# Patient Record
Sex: Female | Born: 1940 | Race: White | Hispanic: No | Smoking: Never smoker
Health system: Southern US, Community
[De-identification: ages and names within clinical notes are randomized; demographics above are authoritative.]

## PROBLEM LIST (undated history)

## (undated) DIAGNOSIS — F419 Anxiety disorder, unspecified: Secondary | ICD-10-CM

## (undated) DIAGNOSIS — K279 Peptic ulcer, site unspecified, unspecified as acute or chronic, without hemorrhage or perforation: Secondary | ICD-10-CM

## (undated) DIAGNOSIS — M7751 Other enthesopathy of right foot: Secondary | ICD-10-CM

## (undated) DIAGNOSIS — Z860100 Personal history of colon polyps, unspecified: Secondary | ICD-10-CM

## (undated) DIAGNOSIS — D369 Benign neoplasm, unspecified site: Secondary | ICD-10-CM

## (undated) DIAGNOSIS — M1990 Unspecified osteoarthritis, unspecified site: Secondary | ICD-10-CM

## (undated) DIAGNOSIS — H269 Unspecified cataract: Secondary | ICD-10-CM

## (undated) DIAGNOSIS — T7840XA Allergy, unspecified, initial encounter: Secondary | ICD-10-CM

## (undated) DIAGNOSIS — I10 Essential (primary) hypertension: Secondary | ICD-10-CM

## (undated) DIAGNOSIS — K635 Polyp of colon: Secondary | ICD-10-CM

## (undated) DIAGNOSIS — K219 Gastro-esophageal reflux disease without esophagitis: Secondary | ICD-10-CM

## (undated) DIAGNOSIS — E785 Hyperlipidemia, unspecified: Secondary | ICD-10-CM

## (undated) DIAGNOSIS — F32A Depression, unspecified: Secondary | ICD-10-CM

## (undated) HISTORY — DX: Polyp of colon: K63.5

## (undated) HISTORY — DX: Depression, unspecified: F32.A

## (undated) HISTORY — DX: Allergy, unspecified, initial encounter: T78.40XA

## (undated) HISTORY — PX: CERVICAL DISCECTOMY: SHX98

## (undated) HISTORY — DX: Personal history of colonic polyps: Z86.010

## (undated) HISTORY — DX: Personal history of colon polyps, unspecified: Z86.0100

## (undated) HISTORY — PX: ABDOMINAL HYSTERECTOMY: SHX81

## (undated) HISTORY — PX: SPINE SURGERY: SHX786

## (undated) HISTORY — DX: Gastro-esophageal reflux disease without esophagitis: K21.9

## (undated) HISTORY — PX: TUBAL LIGATION: SHX77

## (undated) HISTORY — DX: Essential (primary) hypertension: I10

## (undated) HISTORY — PX: EYE SURGERY: SHX253

## (undated) HISTORY — PX: COLON SURGERY: SHX602

## (undated) HISTORY — DX: Anxiety disorder, unspecified: F41.9

## (undated) HISTORY — PX: CHOLECYSTECTOMY: SHX55

## (undated) HISTORY — DX: Unspecified cataract: H26.9

## (undated) HISTORY — DX: Benign neoplasm, unspecified site: D36.9

## (undated) HISTORY — DX: Other enthesopathy of right foot and ankle: M77.51

## (undated) HISTORY — DX: Hyperlipidemia, unspecified: E78.5

## (undated) HISTORY — PX: OTHER SURGICAL HISTORY: SHX169

## (undated) HISTORY — DX: Peptic ulcer, site unspecified, unspecified as acute or chronic, without hemorrhage or perforation: K27.9

---

## 2000-12-13 ENCOUNTER — Encounter: Payer: Self-pay | Admitting: Neurosurgery

## 2000-12-17 ENCOUNTER — Encounter: Payer: Self-pay | Admitting: Neurosurgery

## 2000-12-17 ENCOUNTER — Ambulatory Visit (HOSPITAL_COMMUNITY): Admission: RE | Admit: 2000-12-17 | Discharge: 2000-12-17 | Payer: Self-pay | Admitting: Neurosurgery

## 2004-12-02 ENCOUNTER — Ambulatory Visit: Payer: Self-pay | Admitting: Gastroenterology

## 2004-12-23 ENCOUNTER — Ambulatory Visit: Payer: Self-pay | Admitting: Cardiology

## 2005-01-02 ENCOUNTER — Ambulatory Visit: Payer: Self-pay

## 2005-01-07 ENCOUNTER — Ambulatory Visit: Payer: Self-pay | Admitting: Cardiology

## 2006-06-13 ENCOUNTER — Ambulatory Visit: Payer: Self-pay | Admitting: Family Medicine

## 2008-01-10 ENCOUNTER — Ambulatory Visit: Payer: Self-pay | Admitting: Gastroenterology

## 2008-06-25 ENCOUNTER — Other Ambulatory Visit: Payer: Self-pay

## 2008-06-25 ENCOUNTER — Emergency Department: Payer: Self-pay | Admitting: Emergency Medicine

## 2008-12-23 ENCOUNTER — Emergency Department: Payer: Self-pay | Admitting: Emergency Medicine

## 2009-01-21 ENCOUNTER — Emergency Department: Payer: Self-pay | Admitting: Emergency Medicine

## 2009-01-23 ENCOUNTER — Ambulatory Visit: Payer: Self-pay | Admitting: Cardiology

## 2009-01-23 ENCOUNTER — Ambulatory Visit: Payer: Self-pay | Admitting: Emergency Medicine

## 2009-01-30 ENCOUNTER — Ambulatory Visit: Payer: Self-pay

## 2009-02-08 ENCOUNTER — Ambulatory Visit: Payer: Self-pay | Admitting: Cardiology

## 2009-05-06 ENCOUNTER — Encounter: Payer: Self-pay | Admitting: Cardiology

## 2009-05-07 ENCOUNTER — Ambulatory Visit: Payer: Self-pay | Admitting: Family Medicine

## 2009-05-20 ENCOUNTER — Ambulatory Visit: Payer: Self-pay | Admitting: Family Medicine

## 2009-09-09 ENCOUNTER — Ambulatory Visit: Payer: Self-pay | Admitting: Family Medicine

## 2009-11-23 HISTORY — PX: BREAST BIOPSY: SHX20

## 2010-04-08 ENCOUNTER — Emergency Department: Payer: Self-pay | Admitting: Unknown Physician Specialty

## 2010-04-11 ENCOUNTER — Emergency Department: Payer: Self-pay | Admitting: Emergency Medicine

## 2010-04-15 ENCOUNTER — Emergency Department: Payer: Self-pay | Admitting: Emergency Medicine

## 2010-04-22 ENCOUNTER — Emergency Department: Payer: Self-pay | Admitting: Emergency Medicine

## 2010-11-06 ENCOUNTER — Ambulatory Visit: Payer: Self-pay | Admitting: Family Medicine

## 2011-04-07 NOTE — Assessment & Plan Note (Signed)
[facility] OFFICE NOTE   NAME:Rita Cruz, Rita Cruz                MRN:          [id number]  DATE:01/30/2009                            DOB:          29-Sep-1941    PRIMARY CARE PHYSICIAN:  Dr. Julieanne Manson at [facility].   HISTORY OF PRESENT ILLNESS:  This is a 70 year old with history of  hypertension who initially presented to Cardiology Clinic for evaluation  of chest discomfort.  The patient was having atypical-type chest pain  associated with emotional stress.  She was seen for this in the  emergency department at [facility] and referred  over to [place] for further evaluation.  Since we last saw her, some of the  stressors in her life have resolved.  She has had no further chest pain  at all.  She did have an exercise treadmill Myoview done last week.  She  exercised for 6 minutes, stopped due to fatigue.  EF was 79% on gated  images.  Normal wall motion.  There were no perfusion defects;  therefore, no evidence for ischemia or infarction.  The patient has been  doing well.  In general, she does not get short of breath with exertion.  She does water aerobics.  The only other issues is that she had a  question of a density on the lateral chest x-ray film done when she came  to the emergency department with chest pain.  She had a CT scan that per  her report seems to show a lung nodule.  She is going to have a followup  CT in about 3 months for that.   PAST MEDICAL HISTORY:  1. Adenosine Myoview March 2010.  The patient exercised for 6 minutes      and stopped due to fatigue.  She reached 84% of maximum predicted      heart rate.  EF of 79%.  Perfusion images were normal with no      evidence for ischemia or infarction.  2. Peptic ulcer disease with history of H. Pylori.  3. Gastroesophageal reflux disease.  4. Hypertension.  5. History of colonic polyps.  6.  Cholecystectomy.  7. Hysterectomy.  8. Cervical diskectomy.  9. Anxiety.   ALLERGIES:  Penicillin.   MEDICATIONS:  1. Lisinopril/hydrochlorothiazide 40/25 mg daily.  2. Labetalol 10 mg daily.  3. Estradiol.  4. Zoloft.  5. Aspirin 81 mg daily.  6. Norvasc 5 mg daily.   Most recent labs from February 2010, show triglycerides 196, LDL 119.   PHYSICAL EXAMINATION:  VITAL SIGNS:  Blood pressure today is 124/70,  heart rate 64 and regular.  GENERAL:  This is a well-developed female in no apparent distress.  NEUROLOGIC:  Alert and oriented x3.  Normal affect.  LUNGS:  Clear to auscultation bilaterally.  Normal respiratory effort.  CARDIOVASCULAR:  Heart regular S1, S2.  No S3, no S4.  No murmur.  No  peripheral edema.  2+ posterior tibial pulses bilaterally.  No carotid  bruit.  NECK:  There is no JVD.  There is no thyromegaly or thyroid nodule.  ABDOMEN:  Soft, nontender.  No hepatosplenomegaly, normal bowel sounds.   ASSESSMENT AND PLAN:  A 70 year old with history of hypertension and  atypical chest pain who came in to Cardiology Clinic for evaluation of  her chest pain.  1. Chest pain.  The patient's pain was quite atypical and seemed to be      associated mainly with anxiety.  It may have been related to a      panic attack as she had a more serious episode when she heard that      her son was sick in the hospital.  We did do an exercise treadmill      Myoview.  This showed no evidence for ischemia or infarction.  She      will continue on her aspirin.  2. Hypertension.  The patient's blood pressure is elevated when she      initially came to clinic.  Blood pressure today is excellent at      124/70.  This is after starting Norvasc.  She will continue on her      Norvasc.  3. Cholesterol.  The patient's LDL is 119.  Given the normal Myoview,      this is probably at goal for her.  4. The patient can follow up as needed in this office in the future.     Marca Ancona,  MD  Electronically Signed    DM/MedQ  DD: 02/08/2009  DT: 02/09/2009  Job #: [id number]   cc:   Julieanne Manson

## 2011-04-07 NOTE — Assessment & Plan Note (Signed)
[facility] OFFICE NOTE   NAME:Rita Cruz, Rita Cruz                MRN:          [id number]  DATE:01/23/2009                            DOB:          1941-08-15    PRIMARY CARE PHYSICIAN:  Dr. Julieanne Manson at [facility].   HISTORY OF PRESENT ILLNESS:  This is a 70 year old with a history of  hypertension and prior negative adenosine Myoview who presents to  Cardiology Clinic for evaluation of chest discomfort.  The patient was  seen in our clinic back in 2006.  She was having atypical chest pain at  that time and had an adenosine Myoview that was normal.  She has not had  Cardiology followup since that time.  She does come here today for  evaluation of chest pain.  She has been under a lot of stress lately.  She was seen in the emergency department at [facility] on January 31 with anxiety and paresthesias in her hands.  Her  systolic blood pressure at that time was greater than 200.  Hydrochlorothiazide was added to her regimen at that time, and she also  was started on Zoloft for anxiety.  She started taking the Zoloft and  felt very weak and jittery after starting Zoloft.  Then on March 1, she  learned that her son was in the emergency department in [place] with  diverticulitis.  She became extremely anxious and then developed burning  in her lower chest.  This was in the middle of the chest.  It came on in  waves.  It was associated with paresthesias in her hands.  She was  extremely anxious.  She actually states that the pain did get somewhat  better with ambulation.  She did come into the emergency department at  [facility].  She had an EKG done while she was  still having pain that was completely normal.  She had one set of  cardiac enzymes that were negative.  While she was in the emergency  department, eventually the pain went away  completely and she was  discharged home.  Yesterday, she had 1 brief spell while she was lying  down in bed of the central chest burning.  This lasted about only for a  few minutes and then completely resolved.  Also, while in the emergency  department at [facility], the patient had a chest x-ray done with a  question of density on the lateral film.  She is having a CT chest today  to follow that up.   PAST MEDICAL HISTORY:  1. Adenosine Myoview in 2006 done for atypical chest pain, showed no      evidence for ischemia or infarct.  2. Peptic ulcer disease with history of H. Pylori.  3. Gastroesophageal reflux disease.  4. Hypertension.  5. History of colonic polyps  6. Cholecystectomy.  7. Hysterectomy.  8. Cervical diskectomy.  9. Anxiety.   ALLERGIES:  PENICILLIN.   MEDICATIONS:  1. Lisinopril 40 mg daily.  2. Nebivolol 10 mg daily.  3. Hydrochlorothiazide 25 mg daily.  4.  Estradiol.  5. Zoloft.  6. Aspirin 81 mg daily.   SOCIAL HISTORY:  The patient does not smoke.  She has 2 children and her  husband has 2 children.  This is her second marriage.  She rarely drinks  alcohol.   FAMILY HISTORY:  The patient's mother died at the age of 69.  The  patient's father had congestive heart failure and diabetes.   REVIEW OF SYSTEMS:  Negative except as noted in the history of present  illness.   Most recent labs from the emergency department in [place] on March 1  showed cardiac enzymes negative x1 set, creatinine 1.1, hematocrit 41,  and platelet 340.  Cholesterol from February 2010, triglycerides 196 and  LDL 119.   EKG was reviewed today shows normal sinus rhythm and is normal EKG.   PHYSICAL EXAMINATION:  VITAL SIGNS:  Blood pressure is 158/78 and heart  rate is 68 and regular.  GENERAL:  This is a well-developed female in no apparent distress.  NEUROLOGIC:  Alert and orient x3.  Normal affect.  LUNGS:  Clear to auscultation bilaterally.  Normal respiratory effort.   CARDIOVASCULAR:  Heart regular S1 and S2.  No S3.  No S4.  There is no  murmur.  There is no peripheral edema.  There are 2+ posterior tibial  pulses bilaterally.  There is no carotid bruit.  There is no edema.  NECK:  There is no JVD.  There is no thyromegaly or thyroid nodule.  HEENT:  Normal exam.  SKIN:  Normal exam.  ABDOMEN:  Soft, nontender.  No hepatosplenomegaly.  Normal bowel sounds.  EXTREMITIES:  No clubbing or cyanosis.  MUSCULOSKELETAL:  Normal exam.   ASSESSMENT AND PLAN:  This is a 70 year old with a history of  hypertension and prior atypical chest pain, who presents to Cardiology  Clinic for evaluation of atypical chest pain.  1. Chest pain.  The patient's chest pain was quite atypical.  It does      seem to be associated with anxiety.  I do think that this probably      was a panic attack upon the patient hearing that her son was sick      in the hospital.  She does have a history of anxiety.  The patient      does have risk factors for coronary artery disease including her      age and her hypertension.  She does use estrogen replacement as      well.  I did tell the patient that I thought it would be reasonable      to go ahead and do an exercise treadmill Myoview to assess for any      evidence of ischemia or infarction.  She will continue taking her      aspirin.  2. Hypertension.  The patient's blood pressure is well above goal at      158/78 today.  She does bring the results from her health fair      screen done last week, and it was 130/100 there.  She is on      nebivolol, lisinopril, and hydrochlorothiazide now.  We will add      Norvasc 5 mg daily today.  She will have a return visit in 2 weeks,      where we will go over the results of her Myoview.  We will also      check a blood pressure at that time and titrate her blood pressure  medications as needed.  3. Cholesterol.  The patient's LDL is 119.  As long as she has a      normal Myoview, this is  probably at goal.     Marca Ancona, MD  Electronically Signed    DM/MedQ  DD: 01/23/2009  DT: 01/24/2009  Job #: [id number]   cc:   Julieanne Manson

## 2011-04-10 NOTE — Op Note (Signed)
[place]  Patient:    Rita Cruz, Rita Cruz                     MRN: [id number] Proc. Date: 12/17/00 Adm. 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  PROCEDURE:  C4-5 and C5-6 anterior cervical diskectomy and fusion with allograft and anterior plate instrumentation.  SURGEON:  Julio Sicks, M.D.  ASSISTANT:  Reinaldo Meeker, M.D.  ANESTHESIA:  General endotracheal.  INDICATION:  Rita Cruz is a 70 year old female with history of neck and left shoulder and upper extremity pain consistent with a left C5 and C6 radiculopathy.  The patient also has early evidence of a cervical myelopathy. MRI scanning demonstrates a central disk herniation with associated spondylosis causing severe compression of the spinal cord at C4-5.  At C5-6, the patient has advanced spondylosis causing severe compression of the exiting left-sided C6 nerve root.  The patient has been counseled as to her options. She has decided to proceed with a C4-5 and C5-6 anterior cervical diskectomy and fusion with allograft and anterior plating for hopeful relief of her symptoms.  DESCRIPTION OF PROCEDURE:  Patient taken to the operating room and placed on the operating table in a supine position.  After adequate level of anesthesia was achieved, the patient was positioned supine with her neck slightly extended and placed in Holter traction.  The anterior cervical region is shaved and prepped sterilely.  A 10 blade was used to make a linear skin incision overlying the C5 level.  This was  carried down sharply to the platysma.  The platysma was then divided vertically, and dissection proceeds along the medial border of the sternomastoid and the carotid sheath on the right side.  Trachea and esophagus are mobilized and carried toward the left. Prevertebral fascia is stripped off the anterior spinal column.  The longus colli muscle is then elevated bilaterally using electrocautery.  A deep self-retaining retractor is placed.  Intraoperative fluoroscopy was used, and the C4-5 and the C5-6 levels were confirmed.  Disk spaces were then incised at both levels using a 15 blade in rectangular fashion.  A wide disk space cleanout was then achieved using pituitary rongeurs, forward- and backward-angled Carlin curettes, Kerrison rongeurs, and high-speed drill.  All elements of the disk were removed down over the posterior annulus at both levels.  The microscope brought into the field and used throughout the remainder of the diskectomy.  Starting first at C4-5, the remaining aspects of osteophytes and annulus were removed using the high-speed drill down to the level of the posterior longitudinal ligament.  Posterior longitudinal ligament then elevated and resected in piecemeal fashion using Kerrison rongeurs.  The underlying thecal sac was identified.  The central disk herniation was completely resected.  Decompression then proceeded out into the left-sided C5 foramen.  The left-sided C5 nerve root was identified and well-decompressed within its foramen.  Decompression then proceeded out to the right-sided C5 foramen.  Once again, the C5 nerve root was identified and found to be well-decompressed.  Attention then placed to C5-6.  Once again, the remaining aspects of the annulus and osteophytes were removed using the high-speed drill down to the level of the posterior longitudinal ligament.  Posterior longitudinal ligament was then elevated and resected in piecemeal fashion using Kerrison  rongeurs.  Thecal sac was identified.  Decompression then proceeded out to the left-sided C6 foramen.  A large amount of osteophytic narrowing was resected using Kerrison rongeurs.  The C6 nerve root was identified and followed out distally to its foramen.  A generous anterior foraminotomy was performed at this level.  Decompression then proceeded out to the right-sided C6 foramen.  Once again the C6 nerve root was identified and widely decompressed.  The wound was then copiously irrigated with antibiotic solution.  Gelfoam was placed operatively for hemostasis, which was found to be good.  Microscope was removed.  A 42.5 mm Atlantis anterior cervical plate was then placed over the C4, C5, and C6 levels.  It was then attached to the C4, C5, and C6 levels by first drilling pilot holes under fluoroscopic guidance, tapping the pilot holes, and then placing 13 mm fixed-angle 4.0 mm cancellous bone screws.  On the left screw at C4, the screw stripped slightly as it was going in.  A rescue 4.5 mm screw was used, which was solidly placed. All screws were given a final tightening.  Locking screws were engaged. Retractor system was removed, hemostasis achieved with the bipolar electrocautery.  The wound was then irrigated a final time with antibiotic solution.  The wound was closed in a typical fashion.  There were no apparent complications.  The patient tolerated the procedure well and was taken to the recovery room postoperatively. DD:  12/17/00 TD:  12/17/00 Job: [id number]

## 2011-05-11 ENCOUNTER — Encounter: Payer: Self-pay | Admitting: Cardiovascular Disease

## 2011-06-11 ENCOUNTER — Ambulatory Visit: Payer: Self-pay | Admitting: Gastroenterology

## 2011-06-13 LAB — PATHOLOGY REPORT

## 2011-06-29 ENCOUNTER — Ambulatory Visit: Payer: Self-pay | Admitting: General Surgery

## 2011-07-01 LAB — PATHOLOGY REPORT

## 2011-11-11 ENCOUNTER — Emergency Department: Payer: Self-pay | Admitting: Internal Medicine

## 2011-12-30 ENCOUNTER — Ambulatory Visit: Payer: Self-pay | Admitting: General Surgery

## 2012-11-30 ENCOUNTER — Ambulatory Visit: Payer: Self-pay | Admitting: Family Medicine

## 2013-03-24 DIAGNOSIS — R159 Full incontinence of feces: Secondary | ICD-10-CM | POA: Insufficient documentation

## 2013-03-24 DIAGNOSIS — N3946 Mixed incontinence: Secondary | ICD-10-CM | POA: Insufficient documentation

## 2013-06-02 ENCOUNTER — Encounter: Payer: Self-pay | Admitting: Urology

## 2013-06-23 ENCOUNTER — Encounter: Payer: Self-pay | Admitting: Urology

## 2013-07-24 ENCOUNTER — Encounter: Payer: Self-pay | Admitting: Urology

## 2013-08-23 ENCOUNTER — Encounter: Payer: Self-pay | Admitting: Urology

## 2013-12-15 ENCOUNTER — Ambulatory Visit: Payer: Self-pay | Admitting: Family Medicine

## 2014-05-17 ENCOUNTER — Ambulatory Visit: Payer: Self-pay | Admitting: Family Medicine

## 2014-06-14 LAB — CBC AND DIFFERENTIAL
HCT: 36 % (ref 36–46)
Hemoglobin: 12.3 g/dL (ref 12.0–16.0)
Neutrophils Absolute: 5 /uL
Platelets: 249 10*3/uL (ref 150–399)
WBC: 7.2 10^3/mL

## 2014-06-14 LAB — HEPATIC FUNCTION PANEL
ALT: 10 U/L (ref 7–35)
AST: 11 U/L — AB (ref 13–35)
Alkaline Phosphatase: 75 U/L (ref 25–125)

## 2014-06-14 LAB — BASIC METABOLIC PANEL
BUN: 13 mg/dL (ref 4–21)
Creatinine: 1.2 mg/dL — AB (ref 0.5–1.1)
Glucose: 103 mg/dL
Potassium: 4.7 mmol/L (ref 3.4–5.3)
Sodium: 141 mmol/L (ref 137–147)

## 2014-06-14 LAB — TSH: TSH: 1.73 u[IU]/mL (ref 0.41–5.90)

## 2014-11-19 ENCOUNTER — Ambulatory Visit: Payer: Self-pay | Admitting: Ophthalmology

## 2014-11-19 DIAGNOSIS — I10 Essential (primary) hypertension: Secondary | ICD-10-CM

## 2014-11-27 ENCOUNTER — Ambulatory Visit: Payer: Self-pay | Admitting: Ophthalmology

## 2014-11-27 DIAGNOSIS — H2512 Age-related nuclear cataract, left eye: Secondary | ICD-10-CM | POA: Diagnosis not present

## 2014-11-27 DIAGNOSIS — C4490 Unspecified malignant neoplasm of skin, unspecified: Secondary | ICD-10-CM | POA: Diagnosis not present

## 2014-11-27 DIAGNOSIS — K219 Gastro-esophageal reflux disease without esophagitis: Secondary | ICD-10-CM | POA: Diagnosis not present

## 2014-11-27 DIAGNOSIS — Z880 Allergy status to penicillin: Secondary | ICD-10-CM | POA: Diagnosis not present

## 2014-11-27 DIAGNOSIS — H269 Unspecified cataract: Secondary | ICD-10-CM | POA: Diagnosis not present

## 2014-11-27 DIAGNOSIS — I10 Essential (primary) hypertension: Secondary | ICD-10-CM | POA: Diagnosis not present

## 2014-11-27 DIAGNOSIS — Z885 Allergy status to narcotic agent status: Secondary | ICD-10-CM | POA: Diagnosis not present

## 2014-11-27 DIAGNOSIS — F419 Anxiety disorder, unspecified: Secondary | ICD-10-CM | POA: Diagnosis not present

## 2014-12-17 ENCOUNTER — Ambulatory Visit: Payer: Self-pay | Admitting: Family Medicine

## 2014-12-17 DIAGNOSIS — Z1231 Encounter for screening mammogram for malignant neoplasm of breast: Secondary | ICD-10-CM | POA: Diagnosis not present

## 2014-12-18 ENCOUNTER — Ambulatory Visit: Payer: Self-pay | Admitting: Ophthalmology

## 2014-12-18 DIAGNOSIS — Z885 Allergy status to narcotic agent status: Secondary | ICD-10-CM | POA: Diagnosis not present

## 2014-12-18 DIAGNOSIS — H269 Unspecified cataract: Secondary | ICD-10-CM | POA: Diagnosis not present

## 2014-12-18 DIAGNOSIS — I10 Essential (primary) hypertension: Secondary | ICD-10-CM | POA: Diagnosis not present

## 2014-12-18 DIAGNOSIS — Z880 Allergy status to penicillin: Secondary | ICD-10-CM | POA: Diagnosis not present

## 2014-12-18 DIAGNOSIS — H2511 Age-related nuclear cataract, right eye: Secondary | ICD-10-CM | POA: Diagnosis not present

## 2015-03-18 DIAGNOSIS — I10 Essential (primary) hypertension: Secondary | ICD-10-CM | POA: Diagnosis not present

## 2015-03-18 DIAGNOSIS — F411 Generalized anxiety disorder: Secondary | ICD-10-CM | POA: Diagnosis not present

## 2015-03-18 DIAGNOSIS — E119 Type 2 diabetes mellitus without complications: Secondary | ICD-10-CM | POA: Diagnosis not present

## 2015-03-18 DIAGNOSIS — K219 Gastro-esophageal reflux disease without esophagitis: Secondary | ICD-10-CM | POA: Diagnosis not present

## 2015-03-18 DIAGNOSIS — F329 Major depressive disorder, single episode, unspecified: Secondary | ICD-10-CM | POA: Diagnosis not present

## 2015-03-18 LAB — HEMOGLOBIN A1C: Hgb A1c MFr Bld: 5.2 % (ref 4.0–6.0)

## 2015-03-24 NOTE — Op Note (Signed)
PATIENT NAME:  Rita Cruz, Rita Cruz MR#:  [id number] DATE OF BIRTH:  [birth year]  DATE OF PROCEDURE:  11/27/2014  PREOPERATIVE DIAGNOSIS:  Nuclear sclerotic cataract of the left eye.   POSTOPERATIVE DIAGNOSIS:  Nuclear sclerotic cataract of the left eye.   OPERATIVE PROCEDURE:  Cataract extraction by phacoemulsification with implant of intraocular lens to left eye.   SURGEON:  Birder Robson, MD.   ANESTHESIA:  1. Managed anesthesia care.  2. Topical tetracaine drops followed by 2% Xylocaine jelly applied in the preoperative holding area.   COMPLICATIONS:  None.   TECHNIQUE:   Stop and chop.   DESCRIPTION OF PROCEDURE:  The patient was examined and consented in the preoperative holding area where the aforementioned topical anesthesia was applied to the left eye and then brought back to the Operating Room where the left eye was prepped and draped in the usual sterile ophthalmic fashion and a lid speculum was placed. A paracentesis was created with the side port blade and the anterior chamber was filled with viscoelastic. A near clear corneal incision was performed with the steel keratome. A continuous curvilinear capsulorrhexis was performed with a cystotome followed by the capsulorrhexis forceps. Hydrodissection and hydrodelineation were carried out with BSS on a blunt cannula. The lens was removed in a stop and chop technique and the remaining cortical material was removed with the irrigation-aspiration handpiece. The capsular bag was inflated with viscoelastic and the Alcon ZCB00, 20.5-diopter lens, serial number [id number] was placed in the capsular bag without complication. The remaining viscoelastic was removed from the eye with the irrigation-aspiration handpiece. The wounds were hydrated. The anterior chamber was flushed with Miostat and the eye was inflated to physiologic pressure. 0.1 mL of cefuroxime concentration 10 mg/mL was placed in the anterior chamber. The wounds were found to be  water tight. The eye was dressed with Vigamox. The patient was given protective glasses to wear throughout the day and a shield with which to sleep tonight. The patient was also given drops with which to begin a drop regimen today and will follow-up with me in one day.     ____________________________ Livingston Diones. Jettson Crable, MD wlp:at D: 11/28/2014 07:50:00 ET T: 11/28/2014 13:37:51 ET JOB#: [id number]  cc: Kerby Borner L. Alyiah Ulloa, MD, <Dictator> Livingston Diones Jaliya Siegmann MD ELECTRONICALLY SIGNED 11/28/2014 15:23

## 2015-03-24 NOTE — Op Note (Signed)
PATIENT NAME:  Rita Cruz, Rita Cruz MR#:  [id number] DATE OF BIRTH:  1941-05-09  DATE OF PROCEDURE:  12/18/2014  PREOPERATIVE DIAGNOSIS:  Nuclear sclerotic cataract of the right eye.   POSTOPERATIVE DIAGNOSIS:  Nuclear sclerotic cataract of the right eye.   OPERATIVE PROCEDURE:  Cataract extraction by phacoemulsification with implant of intraocular lens to right eye.   SURGEON:  Birder Robson, MD.   ANESTHESIA:  1. Managed anesthesia care.  2. Topical tetracaine drops followed by 2% Xylocaine jelly applied in the preoperative holding area.   COMPLICATIONS:  None.   TECHNIQUE:   Stop and chop.  DESCRIPTION OF PROCEDURE:  The patient was examined and consented in the preoperative holding area where the aforementioned topical anesthesia was applied to the right eye and then brought back to the Operating Room where the right eye was prepped and draped in the usual sterile ophthalmic fashion and a lid speculum was placed. A paracentesis was created with the side port blade and the anterior chamber was filled with viscoelastic. A near clear corneal incision was performed with the steel keratome. A continuous curvilinear capsulorrhexis was performed with a cystotome followed by the capsulorrhexis forceps. Hydrodissection and hydrodelineation were carried out with BSS on a blunt cannula. The lens was removed in a stop and chop technique and the remaining cortical material was removed with the irrigation-aspiration handpiece. The capsular bag was inflated with viscoelastic and the Tecnis ZCB00, 21.0-diopter lens, serial number [id number] was placed in the capsular bag without complication. The remaining viscoelastic was removed from the eye with the irrigation-aspiration handpiece. The wounds were hydrated. The anterior chamber was flushed with Miostat and the eye was inflated to physiologic pressure. Cefuroxime was not placed in the eye due to PENICILLIN ALLERGY, rather a 3:1 dilution of Vigamox was  placed in the anterior chamber. The wounds were found to be water tight. The eye was dressed with Vigamox. The patient was given protective glasses to wear throughout the day and a shield with which to sleep tonight. The patient was also given drops with which to begin a drop regimen today and will follow-up with me in one day.   Patient name marry which 32790.6, cataract extraction by phacoemulsification with implant of intraocular lens to the right eye.    ____________________________ Livingston Diones. Shaylea Ucci, MD wlp:at D: 12/18/2014 13:58:22 ET T: 12/18/2014 17:30:51 ET JOB#: [id number]  cc: Maxx Calaway L. Tyrece Vanterpool, MD, <Dictator>      Livingston Diones Breeonna Mone MD ELECTRONICALLY SIGNED 12/26/2014 13:56

## 2015-04-16 DIAGNOSIS — I10 Essential (primary) hypertension: Secondary | ICD-10-CM | POA: Diagnosis not present

## 2015-04-16 DIAGNOSIS — F411 Generalized anxiety disorder: Secondary | ICD-10-CM | POA: Diagnosis not present

## 2015-04-16 DIAGNOSIS — E119 Type 2 diabetes mellitus without complications: Secondary | ICD-10-CM | POA: Diagnosis not present

## 2015-04-16 DIAGNOSIS — N951 Menopausal and female climacteric states: Secondary | ICD-10-CM | POA: Diagnosis not present

## 2015-05-02 ENCOUNTER — Other Ambulatory Visit: Payer: Self-pay | Admitting: Family Medicine

## 2015-05-02 MED ORDER — VENLAFAXINE HCL ER 75 MG PO CP24: 75.0000 mg | ORAL_CAPSULE | Freq: Every day | ORAL | Status: DC

## 2015-05-02 NOTE — Telephone Encounter (Signed)
Pt contacted office for refill request on the following medications:Venlafaxine 75mg  1 tablet daily 90 day supply.  [place].  PQ#[phone]/FH

## 2015-05-11 DIAGNOSIS — K219 Gastro-esophageal reflux disease without esophagitis: Secondary | ICD-10-CM | POA: Diagnosis not present

## 2015-05-11 DIAGNOSIS — F419 Anxiety disorder, unspecified: Secondary | ICD-10-CM | POA: Diagnosis not present

## 2015-05-11 DIAGNOSIS — Z880 Allergy status to penicillin: Secondary | ICD-10-CM | POA: Diagnosis not present

## 2015-05-11 DIAGNOSIS — I10 Essential (primary) hypertension: Secondary | ICD-10-CM | POA: Diagnosis not present

## 2015-05-11 DIAGNOSIS — K5792 Diverticulitis of intestine, part unspecified, without perforation or abscess without bleeding: Secondary | ICD-10-CM | POA: Diagnosis not present

## 2015-05-23 DIAGNOSIS — R739 Hyperglycemia, unspecified: Secondary | ICD-10-CM | POA: Insufficient documentation

## 2015-05-23 DIAGNOSIS — D126 Benign neoplasm of colon, unspecified: Secondary | ICD-10-CM | POA: Insufficient documentation

## 2015-05-23 DIAGNOSIS — IMO0002 Reserved for concepts with insufficient information to code with codable children: Secondary | ICD-10-CM

## 2015-05-23 DIAGNOSIS — E7800 Pure hypercholesterolemia, unspecified: Secondary | ICD-10-CM | POA: Insufficient documentation

## 2015-05-23 DIAGNOSIS — F411 Generalized anxiety disorder: Secondary | ICD-10-CM | POA: Insufficient documentation

## 2015-05-23 DIAGNOSIS — I10 Essential (primary) hypertension: Secondary | ICD-10-CM | POA: Insufficient documentation

## 2015-05-23 DIAGNOSIS — M1990 Unspecified osteoarthritis, unspecified site: Secondary | ICD-10-CM | POA: Insufficient documentation

## 2015-05-23 DIAGNOSIS — F418 Other specified anxiety disorders: Secondary | ICD-10-CM | POA: Insufficient documentation

## 2015-05-23 DIAGNOSIS — M1710 Unilateral primary osteoarthritis, unspecified knee: Secondary | ICD-10-CM | POA: Insufficient documentation

## 2015-05-23 DIAGNOSIS — N951 Menopausal and female climacteric states: Secondary | ICD-10-CM | POA: Insufficient documentation

## 2015-05-23 DIAGNOSIS — J309 Allergic rhinitis, unspecified: Secondary | ICD-10-CM | POA: Insufficient documentation

## 2015-05-23 DIAGNOSIS — K219 Gastro-esophageal reflux disease without esophagitis: Secondary | ICD-10-CM | POA: Insufficient documentation

## 2015-05-23 DIAGNOSIS — F329 Major depressive disorder, single episode, unspecified: Secondary | ICD-10-CM | POA: Insufficient documentation

## 2015-06-26 DIAGNOSIS — Z961 Presence of intraocular lens: Secondary | ICD-10-CM | POA: Diagnosis not present

## 2015-06-29 HISTORY — PX: BREAST BIOPSY: SHX20

## 2015-07-10 ENCOUNTER — Encounter: Payer: Self-pay | Admitting: Family Medicine

## 2015-07-10 ENCOUNTER — Ambulatory Visit (INDEPENDENT_AMBULATORY_CARE_PROVIDER_SITE_OTHER): Payer: Medicare Other | Admitting: Family Medicine

## 2015-07-10 VITALS — BP 110/86 | HR 104 | Temp 98.2°F | Resp 16 | Ht 69.0 in | Wt 181.4 lb

## 2015-07-10 DIAGNOSIS — S161XXA Strain of muscle, fascia and tendon at neck level, initial encounter: Secondary | ICD-10-CM

## 2015-07-10 NOTE — Patient Instructions (Addendum)
Discussed use of ibuprofen 400-800 mg. 3 x day with food. May also use extra strength Tylenol two pills 3 x day with food. Use warm compresses for 20 minutes 3 x day.

## 2015-07-10 NOTE — Progress Notes (Signed)
Subjective:     Patient ID: Rita Cruz, female   DOB: [birth year], 74 y.o.   MRN: [id number]  HPI  Chief Complaint  Patient presents with  . Neck Pain    patient comes in office today with concerns of pain in neck and shoulder. Patient states that pain began on Sunday and describes pain as throbbing. Patient reports pain is now mostly on right side, she has tried applying ice and has taken otc Ibuprofen. Patient statres that when she stands pain in shoulder and neclk is worse.   States she had been lifting photo albums and a couch  prior to onset of her sx. Went to her massage therapist yesterday with minimal relief. Localizes now more to the right posterior cervical area without radiation of pain. Reports hx of cervical diskectomy > 10 years ago. Level of stress is elevated today due to her son's poor result from cervical discectomy which left him with left sided paresis. She has been reluctant to take Xanax more than one time a day but have encouraged her to do so up to 3 x day as needed to help cope. Reports poor tolerance of narcotic pain medication.   Review of Systems  Musculoskeletal:       Denies muscle spasms       Objective:   Physical Exam  Constitutional: She appears well-developed and well-nourished. No distress.  Musculoskeletal:  Cervical ROM decreased in right lateral movement and extension with increased pain. Tender over the right posterior cervical area. Grip strength 5/5 symmetrically  Psychiatric:  Increased anxiety       Assessment:    1. Cervical strain, acute, initial encounter    Plan:    Discussed scheduling nsaid's, Tylenol and applying warm compresses. Encouraged more frequent use of Xanax for stress/anxiety.

## 2015-07-14 DIAGNOSIS — M542 Cervicalgia: Secondary | ICD-10-CM | POA: Diagnosis not present

## 2015-07-15 ENCOUNTER — Ambulatory Visit: Payer: Self-pay | Admitting: Family Medicine

## 2015-07-22 ENCOUNTER — Ambulatory Visit (INDEPENDENT_AMBULATORY_CARE_PROVIDER_SITE_OTHER): Payer: Medicare Other | Admitting: Family Medicine

## 2015-07-22 DIAGNOSIS — R002 Palpitations: Secondary | ICD-10-CM

## 2015-07-22 DIAGNOSIS — I159 Secondary hypertension, unspecified: Secondary | ICD-10-CM | POA: Diagnosis not present

## 2015-07-22 DIAGNOSIS — S46819D Strain of other muscles, fascia and tendons at shoulder and upper arm level, unspecified arm, subsequent encounter: Secondary | ICD-10-CM

## 2015-07-22 DIAGNOSIS — K219 Gastro-esophageal reflux disease without esophagitis: Secondary | ICD-10-CM | POA: Diagnosis not present

## 2015-07-22 DIAGNOSIS — F329 Major depressive disorder, single episode, unspecified: Secondary | ICD-10-CM | POA: Diagnosis not present

## 2015-07-22 DIAGNOSIS — F32A Depression, unspecified: Secondary | ICD-10-CM

## 2015-07-22 MED ORDER — DIAZEPAM 5 MG PO TABS: 5.0000 mg | ORAL_TABLET | Freq: Three times a day (TID) | ORAL | Status: DC | PRN

## 2015-07-22 NOTE — Progress Notes (Signed)
Patient ID: Rita Cruz, female   DOB: January 09, 1941, 74 y.o.   MRN: [id number]       Patient: Rita Cruz Female    DOB: 08/10/1941   74 y.o.   MRN: [id number] Visit Date: 07/22/2015  Today's Provider: Wilhemena Durie, MD   Chief Complaint  Patient presents with  . Hypertension    follow-up, last ov was on 04/16/2015  . Depression    follow-up. pt on xanax  . Palpitations    stopped norvac and started Diltiazem  . Neck Pain    came to see Mikki Santee on 07/10/2015, advised pt to take Ibuprofen or tylenol and warm compresses, while at the [facility] went to Urgent Care they started her on valium   . Gastrophageal Reflux    follow-up   Subjective:    Hypertension This is a chronic problem. The problem is controlled. Associated symptoms include neck pain and palpitations. Pertinent negatives include no chest pain or headaches.  Depression        This is a chronic (not on xanax right now due to taking the valium , once done with the valium will go back to the xanax" pt stated) problem.  Associated symptoms include fatigue.  Associated symptoms include no helplessness, no hopelessness, does not have insomnia, no restlessness, no headaches, not sad and no suicidal ideas.  Compliance with treatment is good. Palpitations  This is a new problem. The current episode started today. Associated symptoms include anxiety and coughing. Pertinent negatives include no chest pain, irregular heartbeat, nausea or syncope.  Neck Pain  This is a new problem. The problem has been gradually improving. The pain is at a severity of 5/10. The pain is mild. Pertinent negatives include no chest pain, headaches or syncope. Treatments tried: valium po. The treatment provided moderate relief.  Gastrophageal Reflux She complains of coughing. She reports no abdominal pain, no chest pain, no heartburn or no nausea. Associated symptoms include fatigue.       Allergies  Allergen Reactions  . Percocet   [Oxycodone-Acetaminophen] Other (See Comments)    Hallucinations Hallucinations  . Levaquin [Levofloxacin In D5w] Diarrhea    Vaginal itching  . Penicillins    Previous Medications   ALPRAZOLAM (XANAX) 0.5 MG TABLET    Take by mouth.   ASPIRIN (ASPIRIN EC LO-DOSE) 81 MG EC TABLET    Take by mouth.   DILTIAZEM (DILACOR XR) 180 MG 24 HR CAPSULE    Take by mouth.   LISINOPRIL (PRINIVIL,ZESTRIL) 40 MG TABLET    Take by mouth.   OMEPRAZOLE (PRILOSEC) 20 MG CAPSULE    Take by mouth.   VENLAFAXINE XR (EFFEXOR XR) 75 MG 24 HR CAPSULE    Take 1 capsule (75 mg total) by mouth daily with breakfast.    Review of Systems  Constitutional: Positive for fatigue.  HENT: Negative.   Eyes: Negative.   Respiratory: Positive for cough.   Cardiovascular: Positive for palpitations. Negative for chest pain and syncope.  Gastrointestinal: Negative for heartburn, nausea and abdominal pain.  Musculoskeletal: Positive for neck pain.  Neurological: Negative for headaches.  Psychiatric/Behavioral: Positive for depression. Negative for suicidal ideas. The patient is nervous/anxious. The patient does not have insomnia.     Social History  Substance Use Topics  . Smoking status: Never Smoker   . Smokeless tobacco: Not on file     Comment: does not smoke  . Alcohol Use: 0.0 oz/week    0 Standard drinks or equivalent per week  Comment: rarely    Objective:   There were no vitals taken for this visit.  Physical Exam  Constitutional: She is oriented to person, place, and time. She appears well-developed and well-nourished.  HENT:  Head: Normocephalic and atraumatic.  Right Ear: External ear normal.  Left Ear: External ear normal.  Nose: Nose normal.  Eyes: Conjunctivae are normal.  Neck: Neck supple.  Cardiovascular: Normal rate, regular rhythm and normal heart sounds.   Pulmonary/Chest: Effort normal and breath sounds normal.  Abdominal: Soft.  Musculoskeletal:  Mild tenderness over the right  trapezius with some knots in the muscle.  Neurological: She is alert and oriented to person, place, and time.  Skin: Skin is warm and dry.  Psychiatric: She has a normal mood and affect. Her behavior is normal. Judgment and thought content normal.        Assessment & Plan:     1. Secondary hypertension, unspecified Controlled  2. Palpitations Benign.  3. Gastroesophageal reflux disease without esophagitis   4. Depression   5. Trapezius muscle strain, unspecified laterality, subsequent encounter  - diazepam (VALIUM) 5 MG tablet; Take 1 tablet (5 mg total) by mouth every 8 (eight) hours as needed for anxiety.  Dispense: 90 tablet; Refill: 0 I have done the exam and reviewed the above chart and it is accurate to the best of my knowledge.       Richard Cranford Mon, MD  [facility]

## 2015-08-19 ENCOUNTER — Ambulatory Visit (INDEPENDENT_AMBULATORY_CARE_PROVIDER_SITE_OTHER): Payer: Medicare Other | Admitting: Family Medicine

## 2015-08-19 ENCOUNTER — Encounter: Payer: Self-pay | Admitting: Family Medicine

## 2015-08-19 VITALS — BP 120/64 | HR 76 | Temp 97.9°F | Resp 16 | Ht 69.0 in | Wt 180.0 lb

## 2015-08-19 DIAGNOSIS — L738 Other specified follicular disorders: Secondary | ICD-10-CM | POA: Diagnosis not present

## 2015-08-19 DIAGNOSIS — Z1283 Encounter for screening for malignant neoplasm of skin: Secondary | ICD-10-CM | POA: Diagnosis not present

## 2015-08-19 DIAGNOSIS — J069 Acute upper respiratory infection, unspecified: Secondary | ICD-10-CM

## 2015-08-19 DIAGNOSIS — R05 Cough: Secondary | ICD-10-CM | POA: Diagnosis not present

## 2015-08-19 DIAGNOSIS — R059 Cough, unspecified: Secondary | ICD-10-CM

## 2015-08-19 DIAGNOSIS — Z872 Personal history of diseases of the skin and subcutaneous tissue: Secondary | ICD-10-CM | POA: Diagnosis not present

## 2015-08-19 DIAGNOSIS — J309 Allergic rhinitis, unspecified: Secondary | ICD-10-CM

## 2015-08-19 DIAGNOSIS — D485 Neoplasm of uncertain behavior of skin: Secondary | ICD-10-CM | POA: Diagnosis not present

## 2015-08-19 DIAGNOSIS — L821 Other seborrheic keratosis: Secondary | ICD-10-CM | POA: Diagnosis not present

## 2015-08-19 MED ORDER — MONTELUKAST SODIUM 10 MG PO TABS: 10.0000 mg | ORAL_TABLET | Freq: Every day | ORAL | Status: DC

## 2015-08-19 MED ORDER — AZITHROMYCIN 250 MG PO TABS: ORAL_TABLET | ORAL | Status: DC

## 2015-08-19 MED ORDER — HYDROCODONE-HOMATROPINE 5-1.5 MG/5ML PO SYRP: 5.0000 mL | ORAL_SOLUTION | Freq: Three times a day (TID) | ORAL | Status: DC | PRN

## 2015-08-19 MED ORDER — AZITHROMYCIN 250 MG PO TABS: ORAL_TABLET | ORAL | Status: AC

## 2015-08-19 NOTE — Progress Notes (Signed)
Patient: Rita Cruz Female    DOB: July 26, 1941   74 y.o.   MRN: [id number] Visit Date: 08/19/2015  Today's Provider: Lelon Huh, MD   Chief Complaint  Patient presents with  . URI   Subjective:    URI  This is a new problem. Episode onset: 9 days. The problem has been unchanged. Associated symptoms include congestion, coughing (productuctive thick phlegm), headaches, a plugged ear sensation, rhinorrhea, sinus pain, sneezing, a sore throat (improved) and wheezing. Pertinent negatives include no abdominal pain, chest pain, diarrhea, dysuria, ear pain, joint pain, joint swelling, nausea, neck pain, rash, swollen glands or vomiting. She has tried decongestant (Robitussin DM) for the symptoms. The treatment provided mild relief.  Patient states wheezing worsens when laying down on right side. It feels like it is coming from her throat, not her chest. Had night sweats a few nights ago, but no sweats, fevers, or chills for the last 2 days. States that phlegm was dark green last week, but is now turning white a clear.      Allergies  Allergen Reactions  . Percocet  [Oxycodone-Acetaminophen] Other (See Comments)    Hallucinations Hallucinations  . Levaquin [Levofloxacin In D5w] Diarrhea    Vaginal itching  . Penicillins Itching   Previous Medications   ALPRAZOLAM (XANAX) 0.5 MG TABLET    Take by mouth at bedtime as needed.    ASPIRIN (ASPIRIN EC LO-DOSE) 81 MG EC TABLET    Take 81 mg by mouth daily.    DIAZEPAM (VALIUM) 5 MG TABLET    Take 1 tablet (5 mg total) by mouth every 8 (eight) hours as needed for anxiety.   DILTIAZEM (DILACOR XR) 180 MG 24 HR CAPSULE    Take by mouth.   LISINOPRIL (PRINIVIL,ZESTRIL) 40 MG TABLET    Take 40 mg by mouth daily.    OMEPRAZOLE (PRILOSEC) 20 MG CAPSULE    Take 20 mg by mouth daily.    VENLAFAXINE XR (EFFEXOR XR) 75 MG 24 HR CAPSULE    Take 1 capsule (75 mg total) by mouth daily with breakfast.    Review of Systems  Constitutional:  Positive for chills and diaphoresis. Negative for fever, appetite change and fatigue.  HENT: Positive for congestion, rhinorrhea, sneezing and sore throat (improved). Negative for ear pain.   Respiratory: Positive for cough (productuctive thick phlegm) and wheezing. Negative for chest tightness and shortness of breath.   Cardiovascular: Negative for chest pain and palpitations.  Gastrointestinal: Negative for nausea, vomiting, abdominal pain and diarrhea.  Genitourinary: Negative for dysuria.  Musculoskeletal: Negative for joint pain and neck pain.  Skin: Negative for rash.  Neurological: Positive for headaches. Negative for dizziness and weakness.    Social History  Substance Use Topics  . Smoking status: Never Smoker   . Smokeless tobacco: Not on file     Comment: does not smoke  . Alcohol Use: 0.0 oz/week    0 Standard drinks or equivalent per week     Comment: rarely    Objective:   BP 120/64 mmHg  Pulse 76  Temp(Src) 97.9 F (36.6 C) (Oral)  Resp 16  Ht 5\' 9"  (1.753 m)  Wt 180 lb (81.647 kg)  BMI 26.57 kg/m2  SpO2 96%  Physical Exam  General Appearance:    Alert, cooperative, no distress  HENT:   bilateral TM normal without fluid or infection, neck without nodes, pharynx erythematous without exudate, sinuses nontender, post nasal drip noted and nasal mucosa  congested  Eyes:    PERRL, conjunctiva/corneas clear, EOM's intact       Lungs:     Clear to auscultation bilaterally, respirations unlabored  Heart:    Regular rate and rhythm  Neurologic:   Awake, alert, oriented x 3. No apparent focal neurological           defect.           Assessment & Plan:     1. Upper respiratory infection At this point is consistent with resolving viral infection. Counseled on s/s of bacterial infection and may fill rx azithromycin if these develop.  - azithromycin (ZITHROMAX) 250 MG tablet; 2 by mouth today, then 1 daily for 4 days  Dispense: 6 tablet; Refill: 0  2. Cough Is keeping  her awake at night and OTC Robitussin not working well. May be aggravated by allergies with recent weather changes.  - montelukast (SINGULAIR) 10 MG tablet; Take 1 tablet (10 mg total) by mouth at bedtime.  Dispense: 30 tablet; Refill: 0 - HYDROcodone-homatropine (HYCODAN) 5-1.5 MG/5ML syrup; Take 5 mLs by mouth every 8 (eight) hours as needed for cough.  Dispense: 120 mL; Refill: 0  3. Allergic rhinitis, unspecified allergic rhinitis type  - montelukast (SINGULAIR) 10 MG tablet; Take 1 tablet (10 mg total) by mouth at bedtime.  Dispense: 30 tablet; Refill: 0     Call if symptoms change or if not rapidly improving.      Lelon Huh, MD  [facility]

## 2015-10-05 ENCOUNTER — Other Ambulatory Visit: Payer: Self-pay | Admitting: Family Medicine

## 2015-10-14 ENCOUNTER — Other Ambulatory Visit: Payer: Self-pay | Admitting: Family Medicine

## 2015-10-28 ENCOUNTER — Other Ambulatory Visit: Payer: Self-pay | Admitting: Family Medicine

## 2016-01-30 ENCOUNTER — Ambulatory Visit (INDEPENDENT_AMBULATORY_CARE_PROVIDER_SITE_OTHER): Payer: PPO | Admitting: Physician Assistant

## 2016-01-30 ENCOUNTER — Encounter: Payer: Self-pay | Admitting: Physician Assistant

## 2016-01-30 VITALS — BP 148/80 | HR 63 | Temp 99.2°F | Resp 16 | Wt 179.0 lb

## 2016-01-30 DIAGNOSIS — K1120 Sialoadenitis, unspecified: Secondary | ICD-10-CM

## 2016-01-30 DIAGNOSIS — R6889 Other general symptoms and signs: Secondary | ICD-10-CM

## 2016-01-30 DIAGNOSIS — J358 Other chronic diseases of tonsils and adenoids: Secondary | ICD-10-CM | POA: Diagnosis not present

## 2016-01-30 LAB — POCT INFLUENZA A/B
Influenza A, POC: NEGATIVE
Influenza B, POC: NEGATIVE

## 2016-01-30 MED ORDER — CEFDINIR 300 MG PO CAPS: 300.0000 mg | ORAL_CAPSULE | Freq: Two times a day (BID) | ORAL | Status: DC

## 2016-01-30 NOTE — Progress Notes (Signed)
Patient: Rita Cruz Female    DOB: September 18, 1941   75 y.o.   MRN: [id number] Visit Date: 01/30/2016  Today's Provider: Mar Daring, PA-C   No chief complaint on file.  Subjective:    HPI Patient is here with c/o of not feeling good since yesterday. Has URI symptom. Temperature at home last night was 99.8 with chills, very fatigue, SOB, chest tightness. She noticed her eyes were puffy last night.  Has had sore throat.     Allergies  Allergen Reactions  . Percocet  [Oxycodone-Acetaminophen] Other (See Comments)    Hallucinations Hallucinations  . Levaquin [Levofloxacin In D5w] Diarrhea    Vaginal itching  . Penicillins Itching   Previous Medications   ALPRAZOLAM (XANAX) 0.5 MG TABLET    TAKE ONE TABLET BY MOUTH EVERY 12 HOURS AS NEEDED   ASPIRIN (ASPIRIN EC LO-DOSE) 81 MG EC TABLET    Take 81 mg by mouth daily.    DIAZEPAM (VALIUM) 5 MG TABLET    Take 1 tablet (5 mg total) by mouth every 8 (eight) hours as needed for anxiety.   DILTIAZEM (DILACOR XR) 180 MG 24 HR CAPSULE    TAKE ONE CAPSULE BY MOUTH ONCE DAILY   HYDROCODONE-HOMATROPINE (HYCODAN) 5-1.5 MG/5ML SYRUP    Take 5 mLs by mouth every 8 (eight) hours as needed for cough.   LISINOPRIL (PRINIVIL,ZESTRIL) 40 MG TABLET    Take 40 mg by mouth daily.    MONTELUKAST (SINGULAIR) 10 MG TABLET    Take 1 tablet (10 mg total) by mouth at bedtime.   OMEPRAZOLE (PRILOSEC) 20 MG CAPSULE    TAKE ONE CAPSULE BY MOUTH ONCE DAILY   VENLAFAXINE XR (EFFEXOR XR) 75 MG 24 HR CAPSULE    Take 1 capsule (75 mg total) by mouth daily with breakfast.    Review of Systems  Constitutional: Positive for fever (99.8 last night), chills and fatigue.  HENT: Positive for congestion, postnasal drip, sinus pressure, sneezing and sore throat. Negative for rhinorrhea.   Eyes: Positive for visual disturbance (two night ago).       Eyes were puffy last night.  Respiratory: Positive for cough, chest tightness and shortness of breath.  Negative for wheezing.   Cardiovascular: Positive for palpitations (she thinks is coming from her anxiety but has gotten worst. ). Negative for chest pain and leg swelling.  Gastrointestinal: Negative for nausea, vomiting and abdominal pain.       Has been going to the bathroom more frequently today but is not diarrhea.  Neurological: Positive for headaches. Negative for dizziness.    Social History  Substance Use Topics  . Smoking status: Never Smoker   . Smokeless tobacco: Not on file     Comment: does not smoke  . Alcohol Use: 0.0 oz/week    0 Standard drinks or equivalent per week     Comment: rarely    Objective:   There were no vitals taken for this visit.  Physical Exam  Constitutional: She appears well-developed and well-nourished. No distress.  HENT:  Head: Normocephalic and atraumatic.  Right Ear: Hearing, tympanic membrane, external ear and ear canal normal. No middle ear effusion.  Left Ear: Hearing, tympanic membrane, external ear and ear canal normal.  No middle ear effusion.  Nose: Nose normal. Right sinus exhibits no maxillary sinus tenderness and no frontal sinus tenderness. Left sinus exhibits no maxillary sinus tenderness and no frontal sinus tenderness.  Mouth/Throat: Uvula is midline, oropharynx is  clear and moist and mucous membranes are normal. No oropharyngeal exudate.  Tonsillar stones noted bilaterally. Tonsils slightly swollen. Tonsillar glands tender and enlarged. Left tonsil had patch of erythema and ulcer like appearance behind two tonsillar stones.  Eyes: Conjunctivae are normal. Pupils are equal, round, and reactive to light. Right eye exhibits no discharge. Left eye exhibits no discharge. No scleral icterus.  Neck: Normal range of motion. Neck supple. No tracheal deviation present. No thyromegaly present.  Cardiovascular: Normal rate, regular rhythm and normal heart sounds.  Exam reveals no gallop and no friction rub.   No murmur  heard. Pulmonary/Chest: Effort normal and breath sounds normal. No stridor. No respiratory distress. She has no wheezes. She has no rales.  Lymphadenopathy:    She has no cervical adenopathy.  Skin: Skin is warm and dry. She is not diaphoretic.  Vitals reviewed.       Assessment & Plan:     1. Flu-like symptoms Flu test negative. - POCT Influenza A/B  2. Tonsil stone Worsening symptoms with fever. Advised to suck on sour candies or drink limit juice to help dislodge stones. Will give Omnicef as below for what seems to be infection versus possible abscess of the left tonsil. There was no pussy drainage noted just a ulcer like appearance on the left tonsil. She is to take Tylenol and ibuprofen alternatively for fevers and pain. She is to call the office if the stones do not dislodge or if fevers, pain, swelling  worsen.  3. Sialadenitis See above medical treatment plan. - cefdinir (OMNICEF) 300 MG capsule; Take 1 capsule (300 mg total) by mouth 2 (two) times daily.  Dispense: 14 capsule; Refill: 0       Mar Daring, PA-C  [facility]

## 2016-01-30 NOTE — Patient Instructions (Addendum)
Salivary Gland Infection °A salivary gland infection is an infection in one or more of the glands that produce spit (saliva). You have six major salivary glands. Each gland has a duct that carries saliva into your mouth. Saliva keeps your mouth moist and breaks down the food that you eat. It also helps to prevent tooth decay. °Two salivary glands are located just in front of your ears (parotid). The ducts for these glands open up inside your cheeks, near your back teeth. You also have two glands under your tongue (sublingual) and two glands under your jaw (submandibular). The ducts for these glands open under your tongue. Any salivary gland can become infected. Most infections occur in the parotid glands or submandibular glands. °CAUSES °Salivary glands can be infected by viruses or bacteria. °· The mumps virus is the most common cause of viral salivary gland infections, though mumps is now rare in many areas because of vaccination. °¨ This infection causes swelling in both parotid glands. °¨ Viral infections are more common in children. °· The bacteria that cause salivary gland infections are usually the same bacteria that normally live in your mouth. °¨ A stone can form in a salivary gland and block the flow of saliva. As a result, saliva backs up into the salivary gland. Bacteria may then start to grow behind the blockage and cause infection. °¨ Bacterial infections usually cause pain and swelling on one side of the face. Submandibular gland swelling occurs under the jaw. Parotid swelling occurs in front of the ear. °¨ Bacterial infections are more common in adults. °RISK FACTORS °Children who do not get the MMR (measles, mumps, rubella) vaccine are more likely to get mumps, which can cause a viral salivary gland infection. °Risk factors for bacterial infections include: °· Poor dental care (oral hygiene). °· Smoking. °· Not drinking enough water. °· Having a disease that causes dry mouth and dry eyes (Mikulicz  syndrome or Sjogren syndrome). °SIGNS AND SYMPTOMS °The main sign of salivary gland infection is a swollen salivary gland. This type of inflammation is often called sialadenitis. You may have swelling in front of your ear, under your jaw, or under your tongue. Swelling may get worse when you eat and decrease after you eat. Other signs and symptoms include: °· Pain. °· Tenderness. °· Redness. °· Dry mouth. °· Bad taste in your mouth. °· Difficulty chewing and swallowing. °· Fever. °DIAGNOSIS °Your health care provider may suspect a salivary gland infection based on your signs and symptoms. He or she will also do a physical exam. The health care provider will look and feel inside your mouth to see whether a stone is blocking a salivary gland duct. You may need to see an ear, nose, and throat specialist (ENT or otolaryngologist) for diagnosis and treatment. You may also need to have diagnostic tests, such as: °· An X-ray to check for a stone. °· Other imaging studies to look for an abscess and to rule out other causes of swelling. These tests may include: °¨ Ultrasound. °¨ CT scan. °¨ MRI. °· Culture and sensitivity test. This involves collecting a sample of pus for testing in the lab to see what bacteria grow and what antibiotics they are sensitive to. The testing sample may be: °¨ Swabbed from a salivary gland duct. °¨ Withdrawn from a swollen gland with a needle (aspiration). °TREATMENT °Viral salivary gland infections usually clear up without treatment. Bacterial infections are usually treated with antibiotic medicine. Severe infections that cause difficulty with swallowing may   be treated with an IV antibiotic in the hospital. °Other treatments may include: °· Probing and widening the salivary duct to allow a stone to pass. In some cases, a thin, flexible scope (endoscope) may be inserted into the duct to find a stone and remove it. °· Breaking up a stone using sound waves. °· Draining an infected gland (abscess)  with a needle. °· In some cases, you may need surgery so your health care provider can: °¨ Remove a stone. °¨ Drain pus from an abscess. °¨ Remove a badly infected gland. °HOME CARE INSTRUCTIONS °· Take medicines only as directed by your health care provider. °· If you were prescribed an antibiotic medicine, finish it all even if you start to feel better. °· Follow these instructions every few hours: °¨ Suck on a lemon candy to stimulate the flow of saliva. °¨ Put a warm compress over the gland. °¨ Gently massage the gland. °· Drink enough fluid to keep your urine clear or pale yellow. °· Rinse your mouth with a mixture of warm water and salt every few hours. To make this mixture, add a pinch of salt to 1 cup of warm water. °· Practice good oral hygiene by brushing and flossing your teeth after meals and before you go to bed. °· Do not use any tobacco products, including cigarettes, chewing tobacco, or electronic cigarettes. If you need help quitting, ask your health care provider. °SEEK MEDICAL CARE IF: °· You have pain and swelling in your face, jaw, or mouth after eating. °· You have persistent swelling in any of these places: °¨ In front of your ear. °¨ Under your jaw. °¨ Inside your mouth. °SEEK IMMEDIATE MEDICAL CARE IF:  °· You have pain and swelling in your face, jaw, or mouth that are getting worse. °· Your pain and swelling make it hard to swallow or breathe. °  °This information is not intended to replace advice given to you by your health care provider. Make sure you discuss any questions you have with your health care provider. °  °Document Released: 12/17/2004 Document Revised: 11/30/2014 Document Reviewed: 04/11/2014 °Elsevier Interactive Patient Education ©2016 [employer] ° °

## 2016-02-10 ENCOUNTER — Telehealth: Payer: Self-pay

## 2016-02-10 DIAGNOSIS — Z1239 Encounter for other screening for malignant neoplasm of breast: Secondary | ICD-10-CM

## 2016-02-10 NOTE — Telephone Encounter (Signed)
Order put in for [facility]

## 2016-02-28 ENCOUNTER — Other Ambulatory Visit: Payer: Self-pay | Admitting: Family Medicine

## 2016-02-28 ENCOUNTER — Ambulatory Visit
Admission: RE | Admit: 2016-02-28 | Discharge: 2016-02-28 | Disposition: A | Discharge status: Home / Self Care | Payer: PPO | Source: Ambulatory Visit | Attending: Family Medicine | Admitting: Family Medicine

## 2016-02-28 DIAGNOSIS — Z1231 Encounter for screening mammogram for malignant neoplasm of breast: Secondary | ICD-10-CM | POA: Diagnosis not present

## 2016-02-28 DIAGNOSIS — Z1239 Encounter for other screening for malignant neoplasm of breast: Secondary | ICD-10-CM

## 2016-03-03 DIAGNOSIS — L821 Other seborrheic keratosis: Secondary | ICD-10-CM | POA: Diagnosis not present

## 2016-03-03 DIAGNOSIS — L570 Actinic keratosis: Secondary | ICD-10-CM | POA: Diagnosis not present

## 2016-03-03 DIAGNOSIS — D485 Neoplasm of uncertain behavior of skin: Secondary | ICD-10-CM | POA: Diagnosis not present

## 2016-03-03 DIAGNOSIS — Z1283 Encounter for screening for malignant neoplasm of skin: Secondary | ICD-10-CM | POA: Diagnosis not present

## 2016-03-03 DIAGNOSIS — Z872 Personal history of diseases of the skin and subcutaneous tissue: Secondary | ICD-10-CM | POA: Diagnosis not present

## 2016-04-10 ENCOUNTER — Encounter: Payer: Self-pay | Admitting: Physician Assistant

## 2016-04-10 ENCOUNTER — Ambulatory Visit (INDEPENDENT_AMBULATORY_CARE_PROVIDER_SITE_OTHER): Payer: PPO | Admitting: Physician Assistant

## 2016-04-10 ENCOUNTER — Ambulatory Visit
Admission: RE | Admit: 2016-04-10 | Discharge: 2016-04-10 | Disposition: A | Discharge status: Home / Self Care | Payer: PPO | Source: Ambulatory Visit | Attending: Physician Assistant | Admitting: Physician Assistant

## 2016-04-10 VITALS — BP 140/78 | HR 74 | Temp 97.8°F | Resp 16 | Wt 181.0 lb

## 2016-04-10 DIAGNOSIS — S20219A Contusion of unspecified front wall of thorax, initial encounter: Secondary | ICD-10-CM | POA: Diagnosis not present

## 2016-04-10 DIAGNOSIS — S20211A Contusion of right front wall of thorax, initial encounter: Secondary | ICD-10-CM

## 2016-04-10 DIAGNOSIS — M7072 Other bursitis of hip, left hip: Secondary | ICD-10-CM

## 2016-04-10 DIAGNOSIS — W19XXXA Unspecified fall, initial encounter: Secondary | ICD-10-CM | POA: Diagnosis not present

## 2016-04-10 MED ORDER — MELOXICAM 15 MG PO TABS: 15.0000 mg | ORAL_TABLET | Freq: Every day | ORAL | Status: DC

## 2016-04-10 NOTE — Patient Instructions (Signed)
Rib Contusion A rib contusion is a deep bruise on your rib area. Contusions are the result of a blunt trauma that causes bleeding and injury to the tissues under the skin. A rib contusion may involve bruising of the ribs and of the skin and muscles in the area. The skin overlying the contusion may turn blue, purple, or yellow. Minor injuries will give you a painless contusion, but more severe contusions may stay painful and swollen for a few weeks. CAUSES  A contusion is usually caused by a blow, trauma, or direct force to an area of the body. This often occurs while playing contact sports. SYMPTOMS  Swelling and redness of the injured area.  Discoloration of the injured area.  Tenderness and soreness of the injured area.  Pain with or without movement. DIAGNOSIS  The diagnosis can be made by taking a medical history and performing a physical exam. An X-ray, CT scan, or MRI may be needed to determine if there were any associated injuries, such as broken bones (fractures) or internal injuries. TREATMENT  Often, the best treatment for a rib contusion is rest. Icing or applying cold compresses to the injured area may help reduce swelling and inflammation. Deep breathing exercises may be recommended to reduce the risk of partial lung collapse and pneumonia. Over-the-counter or prescription medicines may also be recommended for pain control. HOME CARE INSTRUCTIONS   Apply ice to the injured area:  Put ice in a plastic bag.  Place a towel between your skin and the bag.  Leave the ice on for 20 minutes, 2-3 times per day.  Take medicines only as directed by your health care provider.  Rest the injured area. Avoid strenuous activity and any activities or movements that cause pain. Be careful during activities and avoid bumping the injured area.  Perform deep-breathing exercises as directed by your health care provider.  Do not lift anything that is heavier than 5 lb (2.3 kg) until your  health care provider approves.  Do not use any tobacco products, including cigarettes, chewing tobacco, or electronic cigarettes. If you need help quitting, ask your health care provider. SEEK MEDICAL CARE IF:   You have increased bruising or swelling.  You have pain that is not controlled with treatment.  You have a fever. SEEK IMMEDIATE MEDICAL CARE IF:   You have difficulty breathing or shortness of breath.  You develop a continual cough, or you cough up thick or bloody sputum.  You feel sick to your stomach (nauseous), you throw up (vomit), or you have abdominal pain.   This information is not intended to replace advice given to you by your health care provider. Make sure you discuss any questions you have with your health care provider.   Document Released: 08/04/2001 Document Revised: 11/30/2014 Document Reviewed: 08/21/2014 Elsevier Interactive Patient Education 2016 Elsevier Inc.  Hip Bursitis Bursitis is a swelling and soreness (inflammation) of a fluid-filled sac (bursa). This sac overlies and protects the joints.  CAUSES   Injury.  Overuse of the muscles surrounding the joint.  Arthritis.  Gout.  Infection.  Cold weather.  Inadequate warm-up and conditioning prior to activities. The cause may not be known.  SYMPTOMS   Mild to severe irritation.  Tenderness and swelling over the outside of the hip.  Pain with motion of the hip.  If the bursa becomes infected, a fever may be present. Redness, tenderness, and warmth will develop over the hip. Symptoms usually lessen in 3 to 4 weeks with  treatment, but can come back. TREATMENT If conservative treatment does not work, your caregiver may advise draining the bursa and injecting cortisone into the area. This may speed up the healing process. This may also be used as an initial treatment of choice. HOME CARE INSTRUCTIONS   Apply ice to the affected area for 15-20 minutes every 3 to 4 hours while awake for the  first 2 days. Put the ice in a plastic bag and place a towel between the bag of ice and your skin.  Rest the painful joint as much as possible, but continue to put the joint through a normal range of motion at least 4 times per day. When the pain lessens, begin normal, slow movements and usual activities to help prevent stiffness of the hip.  Only take over-the-counter or prescription medicines for pain, discomfort, or fever as directed by your caregiver.  Use crutches to limit weight bearing on the hip joint, if advised.  Elevate your painful hip to reduce swelling. Use pillows for propping and cushioning your legs and hips.  Gentle massage may provide comfort and decrease swelling. SEEK IMMEDIATE MEDICAL CARE IF:   Your pain increases even during treatment, or you are not improving.  You have a fever.  You have heat and inflammation over the involved bursa.  You have any other questions or concerns. MAKE SURE YOU:   Understand these instructions.  Will watch your condition.  Will get help right away if you are not doing well or get worse.   This information is not intended to replace advice given to you by your health care provider. Make sure you discuss any questions you have with your health care provider.   Document Released: 05/01/2002 Document Revised: 02/01/2012 Document Reviewed: 06/11/2015 Elsevier Interactive Patient Education [employer].

## 2016-04-10 NOTE — Progress Notes (Signed)
Patient: Rita Cruz Female    DOB: 03-Jul-1941   75 y.o.   MRN: [id number] Visit Date: 04/10/2016  Today's Provider: Mar Daring, PA-C   Chief Complaint  Patient presents with  . Fall   Subjective:    HPI   Fall: Patient is here today for possible rib fracture. She reports that on mother's day she decided to lay down to relax on a Hammock and all of the sudden a dog grabbed the side of the rope and pulled it or tried to get in hammock with her, she isn't sure. Patient fell and hit the hammock support of the hammock on the right side of her rib cage/flank. She reports the pain is progressively worsening. It hurts to lift her right shoulder and pain radiates to the right side of her chest. The pain gets aggravated by laying in her back or on the right side. She is having SOB and it hurts when she takes deep breath. She has tried IBU just today and tylenol for the pain since Sunday.  She is also concern that her left foot and hip are hurting. Left foot feels like is vibrating and when she walks it feels different from the right leg. She does have history of sciatica and has also had a cortisone injection on the left hip by Dr. Margaretmary Eddy many years ago for what sounds like greater trochanteric bursitis.     Allergies  Allergen Reactions  . Percocet  [Oxycodone-Acetaminophen] Other (See Comments)    Hallucinations Hallucinations  . Levaquin [Levofloxacin In D5w] Diarrhea    Vaginal itching  . Penicillins Itching   Previous Medications   ALPRAZOLAM (XANAX) 0.5 MG TABLET    TAKE ONE TABLET BY MOUTH EVERY 12 HOURS AS NEEDED   ASPIRIN (ASPIRIN EC LO-DOSE) 81 MG EC TABLET    Take 81 mg by mouth daily.    CEFDINIR (OMNICEF) 300 MG CAPSULE    Take 1 capsule (300 mg total) by mouth 2 (two) times daily.   DIAZEPAM (VALIUM) 5 MG TABLET    Take 1 tablet (5 mg total) by mouth every 8 (eight) hours as needed for anxiety.   DILTIAZEM (DILACOR XR) 180 MG 24 HR CAPSULE    TAKE  ONE CAPSULE BY MOUTH ONCE DAILY   HYDROCODONE-HOMATROPINE (HYCODAN) 5-1.5 MG/5ML SYRUP    Take 5 mLs by mouth every 8 (eight) hours as needed for cough.   LISINOPRIL (PRINIVIL,ZESTRIL) 40 MG TABLET    Take 40 mg by mouth daily.    MONTELUKAST (SINGULAIR) 10 MG TABLET    Take 1 tablet (10 mg total) by mouth at bedtime.   OMEPRAZOLE (PRILOSEC) 20 MG CAPSULE    TAKE ONE CAPSULE BY MOUTH ONCE DAILY   VENLAFAXINE XR (EFFEXOR XR) 75 MG 24 HR CAPSULE    Take 1 capsule (75 mg total) by mouth daily with breakfast.    Review of Systems  Constitutional: Negative.   HENT: Negative.   Respiratory: Positive for shortness of breath (difficult to take deep breath secondary to pain). Negative for cough and chest tightness.   Cardiovascular: Negative.  Negative for chest pain, palpitations and leg swelling.  Gastrointestinal: Negative.   Musculoskeletal: Positive for back pain and arthralgias (left hip). Negative for gait problem.  Neurological: Negative.     Social History  Substance Use Topics  . Smoking status: Never Smoker   . Smokeless tobacco: Not on file     Comment: does not smoke  .  Alcohol Use: 0.0 oz/week    0 Standard drinks or equivalent per week     Comment: rarely    Objective:   BP 140/78 mmHg  Pulse 74  Temp(Src) 97.8 F (36.6 C) (Oral)  Resp 16  Wt 181 lb (82.101 kg)  Physical Exam  Constitutional: She appears well-developed and well-nourished. No distress.  Neck: Normal range of motion. Neck supple. No JVD present. No tracheal deviation present. No thyromegaly present.  Cardiovascular: Normal rate, regular rhythm and normal heart sounds.  Exam reveals no gallop and no friction rub.   No murmur heard. Pulses:      Dorsalis pedis pulses are 2+ on the right side, and 2+ on the left side.       Posterior tibial pulses are 2+ on the right side, and 2+ on the left side.  Pulmonary/Chest: Effort normal and breath sounds normal. No respiratory distress. She has no wheezes. She has  no rales. She exhibits tenderness (tender over right rib cage; no ecchymosis or hematoma; no deformity) and bony tenderness (right rib cage). She exhibits no mass, no laceration, no crepitus, no edema, no deformity, no swelling and no retraction.  Musculoskeletal: Normal range of motion. She exhibits no edema or tenderness.       Right hip: Normal.       Left hip: She exhibits bony tenderness (over greater trochanter). She exhibits normal range of motion, normal strength, no tenderness, no swelling, no crepitus, no deformity and no laceration.  Lymphadenopathy:    She has no cervical adenopathy.  Skin: She is not diaphoretic.  Vitals reviewed.       Assessment & Plan:     1. Rib contusion, right, initial encounter Will get chest x-ray to rule out fracture and displacement. I will follow-up pending these results. I will give meloxicam as below to treat for rib contusion. Discussed that even a rib contusion can take a long time to heal, 6-8 weeks. I did advise her to take meloxicam with food. She may apply a heating pad to the right rib cage. She is to call the office if symptoms fail to improve or worsen in the meantime. I will see her back in 2 weeks to see how she is doing with current treatment. - DG Chest 2 View; Future - meloxicam (MOBIC) 15 MG tablet; Take 1 tablet (15 mg total) by mouth daily.  Dispense: 30 tablet; Refill: 0  2. Fall, initial encounter See above medical treatment plan. - DG Chest 2 View; Future - meloxicam (MOBIC) 15 MG tablet; Take 1 tablet (15 mg total) by mouth daily.  Dispense: 30 tablet; Refill: 0  3. Hip bursitis, left She has had a cortisone injection in this area many years ago which was successful. I will treat with meloxicam initially to see if this helps her hip pain as well. I do think the bursitis is starting to cause some irritation of the nerves they run pass the area causing these sensations she is having in her left foot. If no improvement with  meloxicam may consider a steroid injection in the left greater trochanter for hip bursitis. - meloxicam (MOBIC) 15 MG tablet; Take 1 tablet (15 mg total) by mouth daily.  Dispense: 30 tablet; Refill: 0       Mar Daring, PA-C  [facility]

## 2016-04-14 ENCOUNTER — Other Ambulatory Visit: Payer: Self-pay | Admitting: Family Medicine

## 2016-04-24 ENCOUNTER — Ambulatory Visit: Payer: PPO | Admitting: Physician Assistant

## 2016-05-15 ENCOUNTER — Other Ambulatory Visit: Payer: Self-pay | Admitting: Family Medicine

## 2016-06-01 ENCOUNTER — Other Ambulatory Visit: Payer: Self-pay | Admitting: Family Medicine

## 2016-06-25 ENCOUNTER — Ambulatory Visit
Admission: RE | Admit: 2016-06-25 | Discharge: 2016-06-25 | Disposition: A | Discharge status: Home / Self Care | Payer: PPO | Source: Ambulatory Visit | Attending: Family Medicine | Admitting: Family Medicine

## 2016-06-25 ENCOUNTER — Ambulatory Visit (INDEPENDENT_AMBULATORY_CARE_PROVIDER_SITE_OTHER): Payer: PPO | Admitting: Family Medicine

## 2016-06-25 VITALS — BP 118/76 | HR 68 | Resp 16 | Wt 183.0 lb

## 2016-06-25 DIAGNOSIS — M542 Cervicalgia: Secondary | ICD-10-CM

## 2016-06-25 DIAGNOSIS — M5440 Lumbago with sciatica, unspecified side: Secondary | ICD-10-CM

## 2016-06-25 DIAGNOSIS — S46811A Strain of other muscles, fascia and tendons at shoulder and upper arm level, right arm, initial encounter: Secondary | ICD-10-CM

## 2016-06-25 DIAGNOSIS — M47812 Spondylosis without myelopathy or radiculopathy, cervical region: Secondary | ICD-10-CM | POA: Diagnosis not present

## 2016-06-25 MED ORDER — HYDROCODONE-ACETAMINOPHEN 5-325 MG PO TABS: 1.0000 | ORAL_TABLET | ORAL | 0 refills | Status: DC | PRN

## 2016-06-25 MED ORDER — DIAZEPAM 5 MG PO TABS: 5.0000 mg | ORAL_TABLET | Freq: Three times a day (TID) | ORAL | 2 refills | Status: DC | PRN

## 2016-06-25 MED ORDER — CELECOXIB 200 MG PO CAPS: 200.0000 mg | ORAL_CAPSULE | Freq: Every day | ORAL | 3 refills | Status: DC

## 2016-06-25 NOTE — Progress Notes (Signed)
Subjective:  HPI  Patient developed neck pain and swelling 4 days ago on the right side. Sensation described as throbbing. She had same episode happen last year and went to walk in clinic and was given Valium and anti inflammatory at that time. She had a massage 2 days ago in that area but that did not relieve her pain and has been using ice to the area. Also complains of low back pain constant achy.  Prior to Admission medications   Medication Sig Start Date End Date Taking? Authorizing Provider  ALPRAZolam Duanne Moron) 0.5 MG tablet TAKE ONE TABLET BY MOUTH EVERY 12 HOURS AS NEEDED 05/15/16  Yes Jerrol Banana., MD  aspirin (ASPIRIN EC LO-DOSE) 81 MG EC tablet Take 81 mg by mouth daily.  09/25/10  Yes Historical Provider, MD  diltiazem (DILACOR XR) 180 MG 24 hr capsule TAKE ONE CAPSULE BY MOUTH ONCE DAILY 10/14/15  Yes Jerrol Banana., MD  lisinopril (PRINIVIL,ZESTRIL) 40 MG tablet TAKE ONE TABLET BY MOUTH ONCE DAILY 04/14/16  Yes Jerrol Banana., MD  omeprazole (PRILOSEC) 20 MG capsule TAKE ONE CAPSULE BY MOUTH ONCE DAILY 10/07/15  Yes Jerrol Banana., MD  venlafaxine XR (EFFEXOR-XR) 75 MG 24 hr capsule TAKE ONE CAPSULE BY MOUTH ONCE DAILY WITH BREAKFAST 06/01/16  Yes Richard Maceo Pro., MD  montelukast (SINGULAIR) 10 MG tablet Take 1 tablet (10 mg total) by mouth at bedtime. 08/19/15 09/18/15  Birdie Sons, MD    Patient Active Problem List   Diagnosis Date Noted  . Allergic rhinitis 05/23/2015  . Arthropathy, lower leg 05/23/2015  . Benign neoplasm of colon 05/23/2015  . Clinical depression 05/23/2015  . Essential (primary) hypertension 05/23/2015  . Acid reflux 05/23/2015  . Anxiety, generalized 05/23/2015  . Hypercholesteremia 05/23/2015  . Blood glucose elevated 05/23/2015  . Symptomatic menopausal or female climacteric states 05/23/2015  . Arthritis, degenerative 05/23/2015  . Mixed incontinence 03/24/2013    Past Medical History:  Diagnosis Date    . Anxiety   . Chest pain    Adenosine Myoview in 2006 done for atypical chest pain, showed no eviden e for ischemia or infarct  . GERD (gastroesophageal reflux disease)   . History of colonic polyps   . HTN (hypertension)   . PUD (peptic ulcer disease)    with hx of H. Pylori    Social History   Social History  . Marital status: Married    Spouse name: N/A  . Number of children: N/A  . Years of education: N/A   Occupational History  . Not on file.   Social History Main Topics  . Smoking status: Never Smoker  . Smokeless tobacco: Not on file     Comment: does not smoke  . Alcohol use 0.0 oz/week     Comment: rarely   . Drug use: No  . Sexual activity: Not on file   Other Topics Concern  . Not on file   Social History Narrative   Married-2nd marriage. She has 2 children and her husband has 2 children.     Allergies  Allergen Reactions  . Percocet  [Oxycodone-Acetaminophen] Other (See Comments)    Hallucinations Hallucinations  . Levaquin [Levofloxacin In D5w] Diarrhea    Vaginal itching  . Penicillins Itching    Review of Systems  Constitutional: Negative.   Respiratory: Negative.   Cardiovascular: Negative.   Musculoskeletal: Positive for back pain, myalgias and neck pain.    Immunization History  Administered Date(s) Administered  . Pneumococcal Conjugate-13 05/17/2014  . Pneumococcal Polysaccharide-23 04/11/2012  . Td 04/08/2010  . Tdap 04/11/2012  . Zoster 02/04/2009   Objective:  BP 118/76   Pulse 68   Resp 16   Wt 183 lb (83 kg)   BMI 27.02 kg/m   Physical Exam  Constitutional: She is oriented to person, place, and time and well-developed, well-nourished, and in no distress.  Neck: Normal carotid pulses present. Muscular tenderness present. Carotid bruit is not present. Edema present.  Swelling and tenderness in the proximal right trapezius.  Cardiovascular: Normal rate, regular rhythm, normal heart sounds and intact distal pulses.   No  murmur heard. Pulmonary/Chest: Effort normal and breath sounds normal.  Neurological: She is alert and oriented to person, place, and time.    Lab Results  Component Value Date   WBC 7.2 06/14/2014   HGB 12.3 06/14/2014   HCT 36 06/14/2014   PLT 249 06/14/2014   TSH 1.73 06/14/2014   HGBA1C 5.2 03/18/2015    CMP     Component Value Date/Time   NA 141 06/14/2014   K 4.7 06/14/2014   BUN 13 06/14/2014   CREATININE 1.2 (A) 06/14/2014   AST 11 (A) 06/14/2014   ALT 10 06/14/2014   ALKPHOS 75 06/14/2014    Assessment and Plan :  1. Neck pain Try medication as below. Alternate ice and heat. Follow as needed. - DG Cervical Spine Complete; Future - celecoxib (CELEBREX) 200 MG capsule; Take 1 capsule (200 mg total) by mouth daily.  Dispense: 30 capsule; Refill: 3 - diazepam (VALIUM) 5 MG tablet; Take 1 tablet (5 mg total) by mouth 3 (three) times daily as needed for anxiety.  Dispense: 90 tablet; Refill: 2 - HYDROcodone-acetaminophen (NORCO/VICODIN) 5-325 MG tablet; Take 1-2 tablets by mouth every 4 (four) hours as needed for moderate pain.  Dispense: 35 tablet; Refill: 0 This appears to be mostly muscular at this time. I do not think x-rays will help [place] but patient is very anxious. She may benefit from physical therapy. 2. Trapezius muscle strain, right, initial encounter - DG Cervical Spine Complete; Future - celecoxib (CELEBREX) 200 MG capsule; Take 1 capsule (200 mg total) by mouth daily.  Dispense: 30 capsule; Refill: 3 - diazepam (VALIUM) 5 MG tablet; Take 1 tablet (5 mg total) by mouth 3 (three) times daily as needed for anxiety.  Dispense: 90 tablet; Refill: 2  3. Midline low back pain with sciatica, sciatica laterality unspecified Will treat. May need to get further work up for her back and leg pain.  Patient was seen and examined by Dr. Eulas Post and note was scribed by Theressa Millard, RMA.   Miguel Aschoff MD [facility] 06/25/2016 3:57 PM

## 2016-06-26 ENCOUNTER — Telehealth: Payer: Self-pay | Admitting: Family Medicine

## 2016-06-26 NOTE — Telephone Encounter (Signed)
Pt calling for xray results from yesterday.  Pt's call back is [phone]  Thanks Con Memos

## 2016-06-29 ENCOUNTER — Telehealth: Payer: Self-pay | Admitting: Family Medicine

## 2016-06-29 NOTE — Telephone Encounter (Signed)
Pt called for test results on xray of her neck.  Her call back is [phone]  Thanks Con Memos

## 2016-06-29 NOTE — Telephone Encounter (Signed)
Advised patient of results.  

## 2016-07-08 ENCOUNTER — Ambulatory Visit: Payer: PPO | Admitting: Family Medicine

## 2016-07-08 DIAGNOSIS — Z86010 Personal history of colonic polyps: Secondary | ICD-10-CM | POA: Diagnosis not present

## 2016-07-08 DIAGNOSIS — Z800 Family history of malignant neoplasm of digestive organs: Secondary | ICD-10-CM | POA: Diagnosis not present

## 2016-07-08 DIAGNOSIS — K219 Gastro-esophageal reflux disease without esophagitis: Secondary | ICD-10-CM | POA: Diagnosis not present

## 2016-07-08 DIAGNOSIS — K5732 Diverticulitis of large intestine without perforation or abscess without bleeding: Secondary | ICD-10-CM | POA: Diagnosis not present

## 2016-08-25 DIAGNOSIS — S0000XA Unspecified superficial injury of scalp, initial encounter: Secondary | ICD-10-CM | POA: Diagnosis not present

## 2016-08-25 DIAGNOSIS — D485 Neoplasm of uncertain behavior of skin: Secondary | ICD-10-CM | POA: Diagnosis not present

## 2016-08-25 DIAGNOSIS — Z1283 Encounter for screening for malignant neoplasm of skin: Secondary | ICD-10-CM | POA: Diagnosis not present

## 2016-08-25 DIAGNOSIS — Z872 Personal history of diseases of the skin and subcutaneous tissue: Secondary | ICD-10-CM | POA: Diagnosis not present

## 2016-08-25 DIAGNOSIS — L570 Actinic keratosis: Secondary | ICD-10-CM | POA: Diagnosis not present

## 2016-10-05 ENCOUNTER — Encounter: Payer: Self-pay | Admitting: *Deleted

## 2016-10-06 ENCOUNTER — Encounter
Admission: RE | Disposition: A | Discharge status: Home / Self Care | Payer: Self-pay | Source: Ambulatory Visit | Attending: Gastroenterology

## 2016-10-06 ENCOUNTER — Ambulatory Visit: Payer: PPO | Admitting: Anesthesiology

## 2016-10-06 ENCOUNTER — Ambulatory Visit
Admission: RE | Admit: 2016-10-06 | Discharge: 2016-10-06 | Disposition: A | Discharge status: Home / Self Care | Payer: PPO | Source: Ambulatory Visit | Attending: Gastroenterology | Admitting: Gastroenterology

## 2016-10-06 ENCOUNTER — Encounter: Payer: Self-pay | Admitting: *Deleted

## 2016-10-06 DIAGNOSIS — D120 Benign neoplasm of cecum: Secondary | ICD-10-CM | POA: Diagnosis not present

## 2016-10-06 DIAGNOSIS — D123 Benign neoplasm of transverse colon: Secondary | ICD-10-CM | POA: Diagnosis not present

## 2016-10-06 DIAGNOSIS — Z8711 Personal history of peptic ulcer disease: Secondary | ICD-10-CM | POA: Insufficient documentation

## 2016-10-06 DIAGNOSIS — Z1211 Encounter for screening for malignant neoplasm of colon: Secondary | ICD-10-CM | POA: Diagnosis not present

## 2016-10-06 DIAGNOSIS — F419 Anxiety disorder, unspecified: Secondary | ICD-10-CM | POA: Insufficient documentation

## 2016-10-06 DIAGNOSIS — K5730 Diverticulosis of large intestine without perforation or abscess without bleeding: Secondary | ICD-10-CM | POA: Insufficient documentation

## 2016-10-06 DIAGNOSIS — I10 Essential (primary) hypertension: Secondary | ICD-10-CM | POA: Diagnosis not present

## 2016-10-06 DIAGNOSIS — Z86010 Personal history of colonic polyps: Secondary | ICD-10-CM | POA: Diagnosis not present

## 2016-10-06 DIAGNOSIS — M47892 Other spondylosis, cervical region: Secondary | ICD-10-CM | POA: Insufficient documentation

## 2016-10-06 DIAGNOSIS — K5790 Diverticulosis of intestine, part unspecified, without perforation or abscess without bleeding: Secondary | ICD-10-CM | POA: Diagnosis not present

## 2016-10-06 DIAGNOSIS — F329 Major depressive disorder, single episode, unspecified: Secondary | ICD-10-CM | POA: Diagnosis not present

## 2016-10-06 DIAGNOSIS — K219 Gastro-esophageal reflux disease without esophagitis: Secondary | ICD-10-CM | POA: Insufficient documentation

## 2016-10-06 DIAGNOSIS — Z79899 Other long term (current) drug therapy: Secondary | ICD-10-CM | POA: Insufficient documentation

## 2016-10-06 DIAGNOSIS — Z7982 Long term (current) use of aspirin: Secondary | ICD-10-CM | POA: Insufficient documentation

## 2016-10-06 DIAGNOSIS — Z800 Family history of malignant neoplasm of digestive organs: Secondary | ICD-10-CM | POA: Insufficient documentation

## 2016-10-06 DIAGNOSIS — K635 Polyp of colon: Secondary | ICD-10-CM | POA: Diagnosis not present

## 2016-10-06 DIAGNOSIS — Q439 Congenital malformation of intestine, unspecified: Secondary | ICD-10-CM | POA: Insufficient documentation

## 2016-10-06 HISTORY — PX: COLONOSCOPY WITH PROPOFOL: SHX5780

## 2016-10-06 HISTORY — DX: Unspecified osteoarthritis, unspecified site: M19.90

## 2016-10-06 LAB — SURGICAL PATHOLOGY

## 2016-10-06 SURGERY — COLONOSCOPY WITH PROPOFOL
Anesthesia: General

## 2016-10-06 MED ORDER — SODIUM CHLORIDE 0.9 % IV SOLN
INTRAVENOUS | Status: DC
Start: 2016-10-06 — End: 2016-10-06

## 2016-10-06 MED ORDER — EPHEDRINE SULFATE 50 MG/ML IJ SOLN
INTRAMUSCULAR | Status: DC | PRN
  Administered 2016-10-06: 10 mg via INTRAVENOUS
  Administered 2016-10-06: 5 mg via INTRAVENOUS
  Administered 2016-10-06: 10 mg via INTRAVENOUS

## 2016-10-06 MED ORDER — PROPOFOL 500 MG/50ML IV EMUL
INTRAVENOUS | Status: DC | PRN
  Administered 2016-10-06: 120 ug/kg/min via INTRAVENOUS

## 2016-10-06 MED ORDER — FENTANYL CITRATE (PF) 100 MCG/2ML IJ SOLN
INTRAMUSCULAR | Status: DC | PRN
  Administered 2016-10-06: 50 ug via INTRAVENOUS

## 2016-10-06 MED ORDER — MIDAZOLAM HCL 2 MG/2ML IJ SOLN
INTRAMUSCULAR | Status: DC | PRN
  Administered 2016-10-06: 1 mg via INTRAVENOUS

## 2016-10-06 MED ORDER — SODIUM CHLORIDE 0.9 % IV SOLN
INTRAVENOUS | Status: DC
  Administered 2016-10-06: 09:00:00 via INTRAVENOUS

## 2016-10-06 NOTE — Op Note (Signed)
[facility] Patient Name: Rita Cruz Procedure Date: 10/06/2016 9:44 AM MRN: [id number] Account #: [id number] Date of Birth: [birth year] Admit Type: Outpatient Age: 75 Room: [facility] ENDO ROOM 3 Gender: Female Note Status: Finalized Procedure:            Colonoscopy Indications:          Family history of colon cancer in a first-degree                        relative, Personal history of colonic polyps Providers:            Lollie Sails, MD Referring MD:         Janine Ores. Rosanna Randy, MD (Referring MD) Medicines:            Monitored Anesthesia Care Complications:        No immediate complications. Procedure:            Pre-Anesthesia Assessment:                       - ASA Grade Assessment: II - A patient with mild                        systemic disease.                       After obtaining informed consent, the colonoscope was                        passed under direct vision. Throughout the procedure,                        the patient's blood pressure, pulse, and oxygen                        saturations were monitored continuously. The                        Colonoscope was introduced through the anus and                        advanced to the the cecum, identified by appendiceal                        orifice and ileocecal valve. The colonoscopy was                        performed with difficulty. Successful completion of the                        procedure was aided by using manual pressure. Findings:      Multiple small and large-mouthed diverticula were found in the sigmoid       colon and descending colon.      Three flat polyps were found in the distal transverse colon. The polyps       were 3 to 4 mm in size. These polyps were removed with a cold snare.       Resection and retrieval were complete.      Two flat polyps were found in the hepatic flexure. The polyps were 3 to       4 mm in size.  These polyps were removed  with a cold snare. Resection and       retrieval were complete.      A 2 mm polyp was found in the proximal transverse colon. The polyp was       sessile. The polyp was removed with a cold biopsy forceps. Resection and       retrieval were complete.      Two flat polyps were found in the proximal transverse colon. The polyps       were 3 to 4 mm in size. These polyps were removed with a cold snare.       Resection and retrieval were complete.      Three sessile polyps were found in the cecum. The polyps were 2 to 3 mm       in size. These polyps were removed with a cold snare. Resection and       retrieval were complete.      There were 2 further polyps removed completelyfrom the regions above,       but not recovered due to movement of the colon and tortuosity.      The digital rectal exam was normal. Impression:           - Diverticulosis in the sigmoid colon and in the                        descending colon.                       - Three 3 to 4 mm polyps in the distal transverse                        colon, removed with a cold snare. Resected and                        retrieved.                       - Two 3 to 4 mm polyps at the hepatic flexure, removed                        with a cold snare. Resected and retrieved.                       - One 2 mm polyp in the proximal transverse colon,                        removed with a cold biopsy forceps. Resected and                        retrieved.                       - Two 3 to 4 mm polyps in the proximal transverse                        colon, removed with a cold snare. Resected and                        retrieved.                       -  Three 2 to 3 mm polyps in the cecum, removed with a                        cold snare. Resected and retrieved. Recommendation:       - Discharge patient to home.                       - Await pathology results.                       - Telephone GI clinic for pathology results in 1  week. Procedure Code(s):    --- Professional ---                       [id number], Colonoscopy, flexible; with removal of tumor(s),                        polyp(s), or other lesion(s) by snare technique                       45380, 79, Colonoscopy, flexible; with biopsy, single                        or multiple Diagnosis Code(s):    --- Professional ---                       D12.3, Benign neoplasm of transverse colon (hepatic                        flexure or splenic flexure)                       D12.0, Benign neoplasm of cecum                       Z80.0, Family history of malignant neoplasm of                        digestive organs                       Z86.010, Personal history of colonic polyps                       K57.30, Diverticulosis of large intestine without                        perforation or abscess without bleeding CPT copyright 2016 American Medical Association. All rights reserved. The codes documented in this report are preliminary and upon coder review may  be revised to meet current compliance requirements. Lollie Sails, MD 10/06/2016 10:52:40 AM This report has been signed electronically. Number of Addenda: 0 Note Initiated On: 10/06/2016 9:44 AM Scope Withdrawal Time: 0 hours 16 minutes 32 seconds  Total Procedure Duration: 0 hours 53 minutes 57 seconds       [facility]

## 2016-10-06 NOTE — Transfer of Care (Signed)
Immediate Anesthesia Transfer of Care Note  Patient: Rita Cruz  Procedure(s) Performed: Procedure(s): COLONOSCOPY WITH PROPOFOL (N/A)  Patient Location: PACU  Anesthesia Type:General  Level of Consciousness: awake and sedated  Airway & Oxygen Therapy: Patient Spontanous Breathing and Patient connected to nasal cannula oxygen  Post-op Assessment: Report given to RN and Post -op Vital signs reviewed and stable  Post vital signs: Reviewed and stable  Last Vitals:  Vitals:   10/06/16 0904  BP: (!) 126/50  Pulse: 78  Resp: 18  Temp: 37.2 C    Last Pain:  Vitals:   10/06/16 0904  TempSrc: Tympanic         Complications: No apparent anesthesia complications

## 2016-10-06 NOTE — Anesthesia Postprocedure Evaluation (Signed)
Anesthesia Post Note  Patient: Rita Cruz  Procedure(s) Performed: Procedure(s) (LRB): COLONOSCOPY WITH PROPOFOL (N/A)  Patient location during evaluation: PACU Anesthesia Type: General Level of consciousness: awake Pain management: pain level controlled Vital Signs Assessment: post-procedure vital signs reviewed and stable Respiratory status: nonlabored ventilation Cardiovascular status: stable Anesthetic complications: no    Last Vitals:  Vitals:   10/06/16 0904 10/06/16 1051  BP: (!) 126/50 (!) 117/51  Pulse: 78 89  Resp: 18 11  Temp: 37.2 C     Last Pain:  Vitals:   10/06/16 1051  TempSrc: Tympanic                 VAN STAVEREN,Rita Lafauci

## 2016-10-06 NOTE — H&P (Signed)
Outpatient short stay form Pre-procedure 10/06/2016 9:41 AM Rita Sails MD  Primary Physician: Dr. Miguel Aschoff  Reason for visit:  Colonoscopy  History of present illness:  Patient is a 75 year old female presenting today as above. She has family history of colon cancer in mother as well as personal history of adenomatous colon polyps. She tolerated her prep well. She takes 81 mg aspirin daily. She denies use of any other aspirin products or blood thinning agents. It is of note patient does have a history of intermittent diverticulitis seems bothering her more so left lower quadrant pain over the past year.   Current Facility-Administered Medications:  .  0.9 %  sodium chloride infusion, , Intravenous, Continuous, Rita Sails, MD, Last Rate: 20 mL/hr at 10/06/16 0917 .  0.9 %  sodium chloride infusion, , Intravenous, Continuous, Rita Sails, MD  Prescriptions Prior to Admission  Medication Sig Dispense Refill Last Dose  . ALPRAZolam (XANAX) 0.5 MG tablet TAKE ONE TABLET BY MOUTH EVERY 12 HOURS AS NEEDED 55 tablet 2 Past Week at Unknown time  . diltiazem (DILACOR XR) 180 MG 24 hr capsule TAKE ONE CAPSULE BY MOUTH ONCE DAILY 30 capsule 12 10/06/2016 at 0730  . lisinopril (PRINIVIL,ZESTRIL) 40 MG tablet TAKE ONE TABLET BY MOUTH ONCE DAILY 90 tablet 3 10/06/2016 at 0730  . omeprazole (PRILOSEC) 20 MG capsule TAKE ONE CAPSULE BY MOUTH ONCE DAILY 30 capsule 12 10/06/2016 at 0730  . venlafaxine XR (EFFEXOR-XR) 75 MG 24 hr capsule TAKE ONE CAPSULE BY MOUTH ONCE DAILY WITH BREAKFAST 30 capsule 12 10/05/2016 at Unknown time  . aspirin (ASPIRIN EC LO-DOSE) 81 MG EC tablet Take 81 mg by mouth daily.    10/04/2016  . celecoxib (CELEBREX) 200 MG capsule Take 1 capsule (200 mg total) by mouth daily. (Patient not taking: Reported on 10/06/2016) 30 capsule 3 Not Taking at Unknown time  . diazepam (VALIUM) 5 MG tablet Take 1 tablet (5 mg total) by mouth 3 (three) times daily as needed  for anxiety. (Patient not taking: Reported on 10/06/2016) 90 tablet 2 Not Taking at Unknown time  . HYDROcodone-acetaminophen (NORCO/VICODIN) 5-325 MG tablet Take 1-2 tablets by mouth every 4 (four) hours as needed for moderate pain. 35 tablet 0   . montelukast (SINGULAIR) 10 MG tablet Take 1 tablet (10 mg total) by mouth at bedtime. (Patient not taking: Reported on 10/06/2016) 30 tablet 0 Not Taking at Unknown time     Allergies  Allergen Reactions  . Percocet  [Oxycodone-Acetaminophen] Other (See Comments)    Hallucinations Hallucinations  . Levaquin [Levofloxacin In D5w] Diarrhea    Vaginal itching  . Meloxicam     Upset stomach  . Penicillins Itching     Past Medical History:  Diagnosis Date  . Anxiety   . Arthritis    neck  . Chest pain    Adenosine Myoview in 2006 done for atypical chest pain, showed no eviden e for ischemia or infarct  . GERD (gastroesophageal reflux disease)   . History of colonic polyps   . HTN (hypertension)   . PUD (peptic ulcer disease)    with hx of H. Pylori    Review of systems:      Physical Exam    Heart and lungs: Regular rate and rhythm without rub or gallop, lungs are bilaterally clear.    HEENT: Normocephalic atraumatic eyes are anicteric    Other:     Pertinant exam for procedure: Soft nontender nondistended bowel sounds positive  normoactive.    Planned proceedures: Colonoscopy and indicated procedures. I have discussed the risks benefits and complications of procedures to include not limited to bleeding, infection, perforation and the risk of sedation and the patient wishes to proceed.    Rita Sails, MD Gastroenterology 10/06/2016  9:41 AM

## 2016-10-06 NOTE — Anesthesia Preprocedure Evaluation (Signed)
Anesthesia Evaluation  Patient identified by MRN, date of birth, ID band Patient awake    Reviewed: Allergy & Precautions, NPO status , Patient's Chart, lab work & pertinent test results  Airway Mallampati: II       Dental  (+) Teeth Intact   Pulmonary neg pulmonary ROS,    Pulmonary exam normal        Cardiovascular hypertension, Pt. on medications  Rhythm:Regular     Neuro/Psych Anxiety Depression    GI/Hepatic Neg liver ROS, PUD, GERD  Medicated,  Endo/Other  negative endocrine ROS  Renal/GU negative Renal ROS     Musculoskeletal   Abdominal Normal abdominal exam  (+)   Peds negative pediatric ROS (+)  Hematology negative hematology ROS (+)   Anesthesia Other Findings   Reproductive/Obstetrics                             Anesthesia Physical Anesthesia Plan  ASA: II  Anesthesia Plan: General   Post-op Pain Management:    Induction: Intravenous  Airway Management Planned: Natural Airway and Nasal Cannula  Additional Equipment:   Intra-op Plan:   Post-operative Plan:   Informed Consent: I have reviewed the patients History and Physical, chart, labs and discussed the procedure including the risks, benefits and alternatives for the proposed anesthesia with the patient or authorized representative who has indicated his/her understanding and acceptance.     Plan Discussed with: CRNA  Anesthesia Plan Comments:         Anesthesia Quick Evaluation

## 2016-10-06 NOTE — Anesthesia Procedure Notes (Signed)
Performed by: COOK-MARTIN, Daylon Lafavor Pre-anesthesia Checklist: Patient identified, Emergency Drugs available, Suction available, Patient being monitored and Timeout performed Patient Re-evaluated:Patient Re-evaluated prior to inductionOxygen Delivery Method: Nasal cannula Preoxygenation: Pre-oxygenation with 100% oxygen Intubation Type: IV induction Placement Confirmation: CO2 detector and positive ETCO2       

## 2016-10-07 ENCOUNTER — Encounter: Payer: Self-pay | Admitting: Gastroenterology

## 2016-10-07 DIAGNOSIS — L570 Actinic keratosis: Secondary | ICD-10-CM | POA: Diagnosis not present

## 2016-10-08 ENCOUNTER — Other Ambulatory Visit: Payer: Self-pay | Admitting: Family Medicine

## 2016-10-19 ENCOUNTER — Other Ambulatory Visit: Payer: Self-pay | Admitting: Family Medicine

## 2016-10-19 MED ORDER — DILTIAZEM HCL ER 180 MG PO CP24: 180.0000 mg | ORAL_CAPSULE | Freq: Every day | ORAL | 0 refills | Status: DC

## 2016-10-19 NOTE — Telephone Encounter (Signed)
Pt needs refill on her blood pressure   [facility]   They are out town  Please send to [place]  Patient call back I [phone]  Thanks Con Memos

## 2016-10-19 NOTE — Telephone Encounter (Signed)
Medication was filled on 10/13/16 please review. KW

## 2016-10-19 NOTE — Telephone Encounter (Signed)
Last fill was 10/14/15. Sent 30 day supply to [facility] in [place].-aa

## 2016-10-28 ENCOUNTER — Ambulatory Visit (INDEPENDENT_AMBULATORY_CARE_PROVIDER_SITE_OTHER): Payer: PPO | Admitting: Family Medicine

## 2016-10-28 ENCOUNTER — Ambulatory Visit
Admission: RE | Admit: 2016-10-28 | Discharge: 2016-10-28 | Disposition: A | Discharge status: Home / Self Care | Payer: PPO | Source: Ambulatory Visit | Attending: Family Medicine | Admitting: Family Medicine

## 2016-10-28 VITALS — BP 158/72 | HR 66 | Temp 97.9°F | Resp 16 | Wt 181.0 lb

## 2016-10-28 DIAGNOSIS — M7061 Trochanteric bursitis, right hip: Secondary | ICD-10-CM

## 2016-10-28 DIAGNOSIS — M47896 Other spondylosis, lumbar region: Secondary | ICD-10-CM | POA: Diagnosis not present

## 2016-10-28 DIAGNOSIS — M1611 Unilateral primary osteoarthritis, right hip: Secondary | ICD-10-CM | POA: Diagnosis not present

## 2016-10-28 DIAGNOSIS — M47816 Spondylosis without myelopathy or radiculopathy, lumbar region: Secondary | ICD-10-CM | POA: Diagnosis not present

## 2016-10-28 DIAGNOSIS — M79604 Pain in right leg: Secondary | ICD-10-CM | POA: Diagnosis not present

## 2016-10-28 MED ORDER — CELECOXIB 200 MG PO CAPS: 200.0000 mg | ORAL_CAPSULE | Freq: Every day | ORAL | 5 refills | Status: DC

## 2016-10-28 NOTE — Progress Notes (Signed)
Rita Cruz  MRN: [id number] DOB: 12/16/1940  Subjective:  HPI  Patient has had trouble with lower bilateral back pain, right hip pain, and pain radiates at least down to her right knee. She is not able to raise her right leg due to severe pain when she does. Pain catches her in surprise at times. She does not recall any injury to cause these symptoms. This has been going on for 3 weeks off and on and it has become worse. She has hard time sleeping due to pain in her leg and the way she gets comfortable at night is to place her right leg straight out and prop her back against her husband in bed.  Patient Active Problem List   Diagnosis Date Noted  . Allergic rhinitis 05/23/2015  . Arthropathy, lower leg 05/23/2015  . Benign neoplasm of colon 05/23/2015  . Clinical depression 05/23/2015  . Essential (primary) hypertension 05/23/2015  . Acid reflux 05/23/2015  . Anxiety, generalized 05/23/2015  . Hypercholesteremia 05/23/2015  . Blood glucose elevated 05/23/2015  . Symptomatic menopausal or female climacteric states 05/23/2015  . Arthritis, degenerative 05/23/2015  . Mixed incontinence 03/24/2013    Past Medical History:  Diagnosis Date  . Anxiety   . Arthritis    neck  . Chest pain    Adenosine Myoview in 2006 done for atypical chest pain, showed no eviden e for ischemia or infarct  . GERD (gastroesophageal reflux disease)   . History of colonic polyps   . HTN (hypertension)   . PUD (peptic ulcer disease)    with hx of H. Pylori    Social History   Social History  . Marital status: Married    Spouse name: N/A  . Number of children: N/A  . Years of education: N/A   Occupational History  . Not on file.   Social History Main Topics  . Smoking status: Never Smoker  . Smokeless tobacco: Never Used     Comment: does not smoke  . Alcohol use 0.0 oz/week     Comment: rarely   . Drug use: No  . Sexual activity: Not on file   Other Topics Concern  . Not on  file   Social History Narrative   Married-2nd marriage. She has 2 children and her husband has 2 children.     Outpatient Encounter Prescriptions as of 10/28/2016  Medication Sig Note  . ALPRAZolam (XANAX) 0.5 MG tablet TAKE ONE TABLET BY MOUTH EVERY 12 HOURS AS NEEDED   . aspirin (ASPIRIN EC LO-DOSE) 81 MG EC tablet Take 81 mg by mouth daily.  05/23/2015: Received from: [facility]  . diltiazem (DILACOR XR) 180 MG 24 hr capsule Take 1 capsule (180 mg total) by mouth daily.   Marland Kitchen lisinopril (PRINIVIL,ZESTRIL) 40 MG tablet TAKE ONE TABLET BY MOUTH ONCE DAILY   . omeprazole (PRILOSEC) 20 MG capsule TAKE ONE CAPSULE BY MOUTH ONCE DAILY   . venlafaxine XR (EFFEXOR-XR) 75 MG 24 hr capsule TAKE ONE CAPSULE BY MOUTH ONCE DAILY WITH BREAKFAST   . [DISCONTINUED] celecoxib (CELEBREX) 200 MG capsule Take 1 capsule (200 mg total) by mouth daily.   . [DISCONTINUED] diazepam (VALIUM) 5 MG tablet Take 1 tablet (5 mg total) by mouth 3 (three) times daily as needed for anxiety.   . [DISCONTINUED] HYDROcodone-acetaminophen (NORCO/VICODIN) 5-325 MG tablet Take 1-2 tablets by mouth every 4 (four) hours as needed for moderate pain.   . [DISCONTINUED] montelukast (SINGULAIR) 10 MG tablet Take 1 tablet (  10 mg total) by mouth at bedtime. (Patient not taking: Reported on 10/06/2016)    No facility-administered encounter medications on file as of 10/28/2016.     Allergies  Allergen Reactions  . Percocet  [Oxycodone-Acetaminophen] Other (See Comments)    Hallucinations Hallucinations  . Levaquin [Levofloxacin In D5w] Diarrhea    Vaginal itching  . Meloxicam     Upset stomach  . Penicillins Itching    Review of Systems  Constitutional: Negative.   Respiratory: Negative.   Cardiovascular: Negative.   Musculoskeletal: Positive for back pain, joint pain and myalgias.  Neurological: Negative.   Psychiatric/Behavioral: The patient is nervous/anxious and has insomnia.     Objective:  BP (!)  158/72   Pulse 66   Temp 97.9 F (36.6 C)   Resp 16   Wt 181 lb (82.1 kg)   BMI 26.73 kg/m   Physical Exam  Constitutional: She is oriented to person, place, and time and well-developed, well-nourished, and in no distress.  HENT:  Head: Normocephalic and atraumatic.  Eyes: Conjunctivae are normal. No scleral icterus.  Neck: No thyromegaly present.  Cardiovascular: Normal rate and regular rhythm.   Pulmonary/Chest: Effort normal and breath sounds normal.  Abdominal: Soft.  Musculoskeletal: She exhibits tenderness. She exhibits no edema or deformity.  Some tenderness over right greater trochanter.  Neurological: She is alert and oriented to person, place, and time. No cranial nerve deficit. She exhibits normal muscle tone. Gait normal. Coordination normal.  Skin: Skin is warm and dry.  Psychiatric: Mood, memory, affect and judgment normal.    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 Treat with Celebrex and refer to Orthopedics for possible w/u. GAD HTN  I have done the exam and reviewed the chart and it is accurate to the best of my knowledge. Development worker, community has been used and  any errors in dictation or transcription are unintentional. Miguel Aschoff M.D. [facility]

## 2016-11-18 ENCOUNTER — Other Ambulatory Visit: Payer: Self-pay

## 2016-11-18 MED ORDER — DILTIAZEM HCL ER 180 MG PO CP24: 180.0000 mg | ORAL_CAPSULE | Freq: Every day | ORAL | 0 refills | Status: DC

## 2016-11-19 ENCOUNTER — Other Ambulatory Visit: Payer: Self-pay

## 2016-11-19 MED ORDER — DILTIAZEM HCL ER 180 MG PO CP24: 180.0000 mg | ORAL_CAPSULE | Freq: Every day | ORAL | 3 refills | Status: DC

## 2016-11-24 ENCOUNTER — Other Ambulatory Visit: Payer: Self-pay | Admitting: Family Medicine

## 2016-11-24 NOTE — Telephone Encounter (Signed)
Please authroize

## 2016-11-25 ENCOUNTER — Ambulatory Visit (INDEPENDENT_AMBULATORY_CARE_PROVIDER_SITE_OTHER): Payer: PPO | Admitting: Family Medicine

## 2016-11-25 VITALS — BP 132/76 | HR 92 | Temp 98.4°F | Resp 18 | Wt 180.0 lb

## 2016-11-25 DIAGNOSIS — I10 Essential (primary) hypertension: Secondary | ICD-10-CM | POA: Diagnosis not present

## 2016-11-25 DIAGNOSIS — J40 Bronchitis, not specified as acute or chronic: Secondary | ICD-10-CM | POA: Diagnosis not present

## 2016-11-25 DIAGNOSIS — F411 Generalized anxiety disorder: Secondary | ICD-10-CM | POA: Diagnosis not present

## 2016-11-25 DIAGNOSIS — J0100 Acute maxillary sinusitis, unspecified: Secondary | ICD-10-CM | POA: Diagnosis not present

## 2016-11-25 DIAGNOSIS — F3289 Other specified depressive episodes: Secondary | ICD-10-CM

## 2016-11-25 MED ORDER — DOXYCYCLINE HYCLATE 100 MG PO TABS: 100.0000 mg | ORAL_TABLET | Freq: Two times a day (BID) | ORAL | 0 refills | Status: DC

## 2016-11-25 MED ORDER — SERTRALINE HCL 50 MG PO TABS: 50.0000 mg | ORAL_TABLET | Freq: Every day | ORAL | 3 refills | Status: DC

## 2016-11-25 NOTE — Patient Instructions (Addendum)
1)Try saline rinses or Netti Pot, push fluids, can continue using Robitussin DM.  2)Take Effexor every other day for a week and then second week go to every 3 days until you finish the medication.

## 2016-11-25 NOTE — Progress Notes (Signed)
Rita Cruz  MRN: [id number] DOB: Jul 09, 1941  Subjective:  HPI  Patient states she started to feel sick 11/17/16, had some chills at night to begin with, nasal congestion, drainage, having headaches, head pressure, cough started 2 days ago 11/23/16, she is coughing up thick yellow/green phlegm, cough keeping her up at night. She feels feverish at night. She has been using Robitussin DM.  Patient Active Problem List   Diagnosis Date Noted  . Allergic rhinitis 05/23/2015  . Arthropathy, lower leg 05/23/2015  . Benign neoplasm of colon 05/23/2015  . Clinical depression 05/23/2015  . Essential (primary) hypertension 05/23/2015  . Acid reflux 05/23/2015  . Anxiety, generalized 05/23/2015  . Hypercholesteremia 05/23/2015  . Blood glucose elevated 05/23/2015  . Symptomatic menopausal or female climacteric states 05/23/2015  . Arthritis, degenerative 05/23/2015  . Mixed incontinence 03/24/2013    Past Medical History:  Diagnosis Date  . Anxiety   . Arthritis    neck  . Chest pain    Adenosine Myoview in 2006 done for atypical chest pain, showed no eviden e for ischemia or infarct  . GERD (gastroesophageal reflux disease)   . History of colonic polyps   . HTN (hypertension)   . PUD (peptic ulcer disease)    with hx of H. Pylori    Social History   Social History  . Marital status: Married    Spouse name: N/A  . Number of children: N/A  . Years of education: N/A   Occupational History  . Not on file.   Social History Main Topics  . Smoking status: Never Smoker  . Smokeless tobacco: Never Used     Comment: does not smoke  . Alcohol use 0.0 oz/week     Comment: rarely   . Drug use: No  . Sexual activity: Not on file   Other Topics Concern  . Not on file   Social History Narrative   Married-2nd marriage. She has 2 children and her husband has 2 children.     Outpatient Encounter Prescriptions as of 11/25/2016  Medication Sig Note  . ALPRAZolam (XANAX)  0.5 MG tablet TAKE ONE TABLET BY MOUTH EVERY 12 HOURS AS NEEDED   . aspirin (ASPIRIN EC LO-DOSE) 81 MG EC tablet Take 81 mg by mouth daily.  05/23/2015: Received from: [facility]  . celecoxib (CELEBREX) 200 MG capsule Take 1 capsule (200 mg total) by mouth daily.   Marland Kitchen diltiazem (DILACOR XR) 180 MG 24 hr capsule Take 1 capsule (180 mg total) by mouth daily.   Marland Kitchen lisinopril (PRINIVIL,ZESTRIL) 40 MG tablet TAKE ONE TABLET BY MOUTH ONCE DAILY   . omeprazole (PRILOSEC) 20 MG capsule TAKE ONE CAPSULE BY MOUTH ONCE DAILY   . venlafaxine XR (EFFEXOR-XR) 75 MG 24 hr capsule TAKE ONE CAPSULE BY MOUTH ONCE DAILY WITH BREAKFAST    No facility-administered encounter medications on file as of 11/25/2016.     Allergies  Allergen Reactions  . Percocet  [Oxycodone-Acetaminophen] Other (See Comments)    Hallucinations Hallucinations  . Levaquin [Levofloxacin In D5w] Diarrhea    Vaginal itching  . Meloxicam     Upset stomach  . Penicillins Itching    Review of Systems  Constitutional: Positive for chills, fever and malaise/fatigue.  HENT: Positive for congestion.   Respiratory: Positive for cough and sputum production.   Cardiovascular: Negative.   Musculoskeletal: Negative.   Neurological: Positive for headaches.    Objective:  BP 132/76   Pulse 92   Temp 98.4  F (36.9 C)   Resp 18   Wt 180 lb (81.6 kg)   SpO2 97%   BMI 26.58 kg/m   Physical Exam  Constitutional: She is oriented to person, place, and time. Vital signs are normal. She has a sickly appearance.  HENT:  Head: Normocephalic and atraumatic.  Right Ear: External ear normal.  Left Ear: External ear normal.  Eyes: Conjunctivae are normal. Pupils are equal, round, and reactive to light.  Neck: Normal range of motion. Neck supple.  Cardiovascular: Normal rate, regular rhythm, normal heart sounds and intact distal pulses.   No murmur heard. Pulmonary/Chest: Effort normal and breath sounds normal. No respiratory  distress. She has no wheezes.  Musculoskeletal: She exhibits no edema or tenderness.  Neurological: She is alert and oriented to person, place, and time.  Thick purulent green sputum is coughed up during the exam.  Assessment and Plan :  1. Acute non-recurrent maxillary sinusitis Treat with Doxy. Push fluids and rest. Try Robittusin DM.  2. Bronchitis Treat as above.  3. Other depression Patient would like to switch Effexor to something else. Advised patient to take Effexor every other day for a week and then second week go to every 3 days until it is gone. Will re start Zoloft and re check on the next visit.  4. Anxiety, generalized 5. Essential (primary) hypertension Stable.  HPI, Exam and A&P transcribed under direction and in the presence of Miguel Aschoff, MD. I have done the exam and reviewed the chart and it is accurate to the best of my knowledge. Development worker, community has been used and  any errors in dictation or transcription are unintentional. Miguel Aschoff M.D. [facility]

## 2016-12-08 ENCOUNTER — Ambulatory Visit (INDEPENDENT_AMBULATORY_CARE_PROVIDER_SITE_OTHER): Payer: PPO

## 2016-12-08 ENCOUNTER — Ambulatory Visit: Payer: PPO | Admitting: Family Medicine

## 2016-12-08 VITALS — BP 142/82 | HR 80 | Temp 98.2°F | Ht 69.0 in | Wt 179.6 lb

## 2016-12-08 DIAGNOSIS — I10 Essential (primary) hypertension: Secondary | ICD-10-CM

## 2016-12-08 DIAGNOSIS — J40 Bronchitis, not specified as acute or chronic: Secondary | ICD-10-CM

## 2016-12-08 DIAGNOSIS — M7061 Trochanteric bursitis, right hip: Secondary | ICD-10-CM

## 2016-12-08 DIAGNOSIS — Z0000 Encounter for general adult medical examination without abnormal findings: Secondary | ICD-10-CM

## 2016-12-08 DIAGNOSIS — E7800 Pure hypercholesterolemia, unspecified: Secondary | ICD-10-CM | POA: Diagnosis not present

## 2016-12-08 MED ORDER — AZITHROMYCIN 250 MG PO TABS: ORAL_TABLET | ORAL | 0 refills | Status: DC

## 2016-12-08 MED ORDER — CELECOXIB 200 MG PO CAPS: 200.0000 mg | ORAL_CAPSULE | Freq: Every day | ORAL | 2 refills | Status: DC

## 2016-12-08 NOTE — Progress Notes (Addendum)
Subjective:   Rita Cruz is a 76 y.o. female who presents for an Initial Medicare Annual Wellness Visit.  Review of Systems    N/A  Cardiac Risk Factors include: advanced age (>15men, >102 women);dyslipidemia;hypertension     Objective:    Today's Vitals   12/08/16 0854  BP: (!) 142/82  Pulse: 80  Temp: 98.2 F (36.8 C)  TempSrc: Oral  Weight: 179 lb 9.6 oz (81.5 kg)  Height: 5\' 9"  (1.753 m)  PainSc: 0-No pain   Body mass index is 26.52 kg/m.   Current Medications (verified) Outpatient Encounter Prescriptions as of 12/08/2016  Medication Sig  . ALPRAZolam (XANAX) 0.5 MG tablet TAKE ONE TABLET BY MOUTH EVERY 12 HOURS AS NEEDED  . aspirin (ASPIRIN EC LO-DOSE) 81 MG EC tablet Take 81 mg by mouth daily.   Marland Kitchen diltiazem (DILACOR XR) 180 MG 24 hr capsule Take 1 capsule (180 mg total) by mouth daily.  Marland Kitchen lisinopril (PRINIVIL,ZESTRIL) 40 MG tablet TAKE ONE TABLET BY MOUTH ONCE DAILY  . omeprazole (PRILOSEC) 20 MG capsule TAKE ONE CAPSULE BY MOUTH ONCE DAILY  . sertraline (ZOLOFT) 50 MG tablet Take 1 tablet (50 mg total) by mouth daily.  Marland Kitchen venlafaxine (EFFEXOR) 75 MG tablet Take 75 mg by mouth 2 (two) times daily.  . celecoxib (CELEBREX) 200 MG capsule Take 1 capsule (200 mg total) by mouth daily. (Patient not taking: Reported on 12/08/2016)  . doxycycline (VIBRA-TABS) 100 MG tablet Take 1 tablet (100 mg total) by mouth 2 (two) times daily. (Patient not taking: Reported on 12/08/2016)   No facility-administered encounter medications on file as of 12/08/2016.     Allergies (verified) Percocet  [oxycodone-acetaminophen]; Levaquin [levofloxacin in d5w]; Meloxicam; and Penicillins   History: Past Medical History:  Diagnosis Date  . Adenomatous polyps   . Anxiety   . Arthritis    neck  . Chest pain    Adenosine Myoview in 2006 done for atypical chest pain, showed no eviden e for ischemia or infarct  . Colon polyps   . GERD (gastroesophageal reflux disease)   . History  of colonic polyps   . HTN (hypertension)   . Hyperlipidemia   . PUD (peptic ulcer disease)    with hx of H. Pylori   Past Surgical History:  Procedure Laterality Date  . ABDOMINAL HYSTERECTOMY    . BREAST BIOPSY Right 2011  . BREAST BIOPSY Right 2012  . CERVICAL DISCECTOMY    . CHOLECYSTECTOMY    . COLONOSCOPY WITH PROPOFOL N/A 10/06/2016   Procedure: COLONOSCOPY WITH PROPOFOL;  Surgeon: Lollie Sails, MD;  [facility] END[facility]COPY;  Service: Endoscopy;  Laterality: N/A;  . EYE SURGERY    . hysterectomy-unspecified area     Family History  Problem Relation Age of Onset  . Kidney failure Father   . Heart failure Father   . Diabetes Father   . COPD Mother   . Colon cancer Mother   . Heart attack Mother   . Ulcers Mother   . Osteoporosis Mother   . Macular degeneration Mother   . Hypertension Brother   . Kidney Stones Son   . Bipolar disorder Son   . Melanoma Son     stage IV  . Hypertension Son    Social History   Occupational History  . Not on file.   Social History Main Topics  . Smoking status: Never Smoker  . Smokeless tobacco: Never Used     Comment: does not smoke  .  Alcohol use 0.0 oz/week     Comment: rarely - wine  . Drug use: No  . Sexual activity: Not on file    Tobacco Counseling Counseling given: Not Answered   Activities of Daily Living In your present state of health, do you have any difficulty performing the following activities: 12/08/2016  Hearing? N  Vision? N  Difficulty concentrating or making decisions? N  Walking or climbing stairs? Y  Dressing or bathing? N  Doing errands, shopping? N  Preparing Food and eating ? N  Using the Toilet? N  In the past six months, have you accidently leaked urine? Y  Do you have problems with loss of bowel control? N  Managing your Medications? N  Managing your Finances? N  Housekeeping or managing your Housekeeping? N  Some recent data might be hidden    Immunizations and Health  Maintenance Immunization History  Administered Date(s) Administered  . Pneumococcal Conjugate-13 05/17/2014  . Pneumococcal Polysaccharide-23 04/11/2012  . Td 04/08/2010  . Tdap 04/11/2012  . Zoster 02/04/2009   There are no preventive care reminders to display for this patient.  Patient Care Team: Jerrol Banana., MD as PCP - General (Family Medicine) Birder Robson, MD as Referring Physician (Ophthalmology)  Indicate any recent Medical Services you may have received from other than [facility] providers in the past year (date may be approximate).     Assessment:   This is a routine wellness examination for Rita Cruz.   Hearing/Vision screen Vision Screening Comments: Pt sees Dr George Ina for vision checks every 6 months.   Dietary issues and exercise activities discussed: Current Exercise Habits: Home exercise routine, Type of exercise: walking, Time (Minutes): 20, Frequency (Times/Week): 3, Weekly Exercise (Minutes/Week): 60, Intensity: Mild  Goals    . Increase water intake          Starting 12/08/16, I will work to increase my water intake to 4-5 glasses a day.      Depression Screen PHQ 2/9 Scores 12/08/2016 07/22/2015  PHQ - 2 Score 1 0    Fall Risk Fall Risk  12/08/2016 07/22/2015  Falls in the past year? No No    Cognitive Function:        Screening Tests Health Maintenance  Topic Date Due  . MAMMOGRAM  02/27/2018  . COLONOSCOPY  10/07/2019  . TETANUS/TDAP  04/11/2022  . INFLUENZA VACCINE  Completed  . DEXA SCAN  Completed  . ZOSTAVAX  Completed  . PNA vac Low Risk Adult  Completed      Plan:  I have personally reviewed and addressed the Medicare Annual Wellness questionnaire and have noted the following in the patient's chart:  A. Medical and social history B. Use of alcohol, tobacco or illicit drugs  C. Current medications and supplements D. Functional ability and status E.  Nutritional status F.  Physical activity G. Advance  directives H. List of other physicians I.  Hospitalizations, surgeries, and ER visits in previous 12 months J.  [place] such as hearing and vision if needed, cognitive and depression L. Referrals and appointments - none  In addition, I have reviewed and discussed with patient certain preventive protocols, quality metrics, and best practice recommendations. A written personalized care plan for preventive services as well as general preventive health recommendations were provided to patient.  See attached scanned questionnaire for additional information.   Signed,  Fabio Neighbors, LPN Nurse Health Advisor   MD Recommendations: None I have reviewed the health advisors note,  was  available for consultation and I agree with documentation and plan. Miguel Aschoff MD [facility]

## 2016-12-08 NOTE — Patient Instructions (Signed)
Health Maintenance, Female Introduction Adopting a healthy lifestyle and getting preventive care can go a long way to promote health and wellness. Talk with your health care provider about what schedule of regular examinations is right for you. This is a good chance for you to check in with your provider about disease prevention and staying healthy. In between checkups, there are plenty of things you can do on your own. Experts have done a lot of research about which lifestyle changes and preventive measures are most likely to keep you healthy. Ask your health care provider for more information. Weight and diet Eat a healthy diet  Be sure to include plenty of vegetables, fruits, low-fat dairy products, and lean protein.  Do not eat a lot of foods high in solid fats, added sugars, or salt.  Get regular exercise. This is one of the most important things you can do for your health.  Most adults should exercise for at least 150 minutes each week. The exercise should increase your heart rate and make you sweat (moderate-intensity exercise).  Most adults should also do strengthening exercises at least twice a week. This is in addition to the moderate-intensity exercise. Maintain a healthy weight  Body mass index (BMI) is a measurement that can be used to identify possible weight problems. It estimates body fat based on height and weight. Your health care provider can help determine your BMI and help you achieve or maintain a healthy weight.  For females 4 years of age and older:  A BMI below 18.5 is considered underweight.  A BMI of 18.5 to 24.9 is normal.  A BMI of 25 to 29.9 is considered overweight.  A BMI of 30 and above is considered obese. Watch levels of cholesterol and blood lipids  You should start having your blood tested for lipids and cholesterol at 75 years of age, then have this test every 5 years.  You may need to have your cholesterol levels checked more often  if:  Your lipid or cholesterol levels are high.  You are older than 75 years of age.  You are at high risk for heart disease. Cancer screening Lung Cancer  Lung cancer screening is recommended for adults 12-31 years old who are at high risk for lung cancer because of a history of smoking.  A yearly low-dose CT scan of the lungs is recommended for people who:  Currently smoke.  Have quit within the past 15 years.  Have at least a 30-pack-year history of smoking. A pack year is smoking an average of one pack of cigarettes a day for 1 year.  Yearly screening should continue until it has been 15 years since you quit.  Yearly screening should stop if you develop a health problem that would prevent you from having lung cancer treatment. Breast Cancer  Practice breast self-awareness. This means understanding how your breasts normally appear and feel.  It also means doing regular breast self-exams. Let your health care provider know about any changes, no matter how small.  If you are in your 20s or 30s, you should have a clinical breast exam (CBE) by a health care provider every 1-3 years as part of a regular health exam.  If you are 74 or older, have a CBE every year. Also consider having a breast X-ray (mammogram) every year.  If you have a family history of breast cancer, talk to your health care provider about genetic screening.  If you are at high risk for breast cancer,  talk to your health care provider about having an MRI and a mammogram every year.  Breast cancer gene (BRCA) assessment is recommended for women who have family members with BRCA-related cancers. BRCA-related cancers include:  Breast.  Ovarian.  Tubal.  Peritoneal cancers.  Results of the assessment will determine the need for genetic counseling and BRCA1 and BRCA2 testing. Colorectal Cancer  This type of cancer can be detected and often prevented.  Routine colorectal cancer screening usually begins  at 75 years of age and continues through 75 years of age.  Your health care provider may recommend screening at an earlier age if you have risk factors for colon cancer.  Your health care provider may also recommend using home test kits to check for hidden blood in the stool.  A small camera at the end of a tube can be used to examine your colon directly (sigmoidoscopy or colonoscopy). This is done to check for the earliest forms of colorectal cancer.  Routine screening usually begins at age 50.  Direct examination of the colon should be repeated every 5-10 years through 75 years of age. However, you may need to be screened more often if early forms of precancerous polyps or small growths are found. Skin Cancer  Check your skin from head to toe regularly.  Tell your health care provider about any new moles or changes in moles, especially if there is a change in a mole's shape or color.  Also tell your health care provider if you have a mole that is larger than the size of a pencil eraser.  Always use sunscreen. Apply sunscreen liberally and repeatedly throughout the day.  Protect yourself by wearing long sleeves, pants, a wide-brimmed hat, and sunglasses whenever you are outside. Heart disease, diabetes, and high blood pressure  High blood pressure causes heart disease and increases the risk of stroke. High blood pressure is more likely to develop in:  People who have blood pressure in the high end of the normal range (130-139/85-89 mm Hg).  People who are overweight or obese.  People who are African American.  If you are 18-39 years of age, have your blood pressure checked every 3-5 years. If you are 40 years of age or older, have your blood pressure checked every year. You should have your blood pressure measured twice-once when you are at a hospital or clinic, and once when you are not at a hospital or clinic. Record the average of the two measurements. To check your blood pressure  when you are not at a hospital or clinic, you can use:  An automated blood pressure machine at a pharmacy.  A home blood pressure monitor.  If you are between 55 years and 79 years old, ask your health care provider if you should take aspirin to prevent strokes.  Have regular diabetes screenings. This involves taking a blood sample to check your fasting blood sugar level.  If you are at a normal weight and have a low risk for diabetes, have this test once every three years after 75 years of age.  If you are overweight and have a high risk for diabetes, consider being tested at a younger age or more often. Preventing infection Hepatitis B  If you have a higher risk for hepatitis B, you should be screened for this virus. You are considered at high risk for hepatitis B if:  You were born in a country where hepatitis B is common. Ask your health care provider which countries are   considered high risk.  Your parents were born in a high-risk country, and you have not been immunized against hepatitis B (hepatitis B vaccine).  You have HIV or AIDS.  You use needles to inject street drugs.  You live with someone who has hepatitis B.  You have had sex with someone who has hepatitis B.  You get hemodialysis treatment.  You take certain medicines for conditions, including cancer, organ transplantation, and autoimmune conditions. Hepatitis C  Blood testing is recommended for:  Everyone born from [birth year] through 1964.  Anyone with known risk factors for hepatitis C.  Osteoporosis is a disease in which the bones lose minerals and strength with aging. This can result in serious bone fractures. Your risk for osteoporosis can be identified using a bone density scan.  If you are 44 years of age or older, or if you are at risk for osteoporosis and fractures, ask your health care provider if you should be screened.  Ask your health care provider whether you should take a calcium or vitamin D  supplement to lower your risk for osteoporosis.  Menopause may have certain physical symptoms and risks.  Hormone replacement therapy may reduce some of these symptoms and risks. Talk to your health care provider about whether hormone replacement therapy is right for you. Follow these instructions at home:  Schedule regular health, dental, and eye exams.  Stay current with your immunizations.  Do not use any tobacco products including cigarettes, chewing tobacco, or electronic cigarettes.  If you are pregnant, do not drink alcohol.  If you are breastfeeding, limit how much and how often you drink alcohol.  Limit alcohol intake to no more than 1 drink per day for nonpregnant women. One drink equals 12 ounces of beer, 5 ounces of wine, or 1 ounces of hard liquor.  Do not use street drugs.  Do not share needles.  Ask your health care provider for help if you need support or information about quitting drugs.  Tell your health care provider if you often feel depressed.  Tell your health care provider if you have ever been abused or do not feel safe at home. This information is not intended to replace advice given to you by your health care provider. Make sure you discuss any questions you have with your health care provider. Document Released: 05/25/2011 Document Revised: 04/16/2016 Document Reviewed: 08/13/2015  2017 Elsevier

## 2016-12-08 NOTE — Progress Notes (Signed)
Patient: Rita Cruz, Female    DOB: 1941-07-31, 76 y.o.   MRN: [id number] Visit Date: 12/08/2016  Today's Provider: Wilhemena Durie, MD   Chief Complaint  Patient presents with  . Annual Exam   Subjective:  Rita Cruz is a 76 y.o. female who presents today for health maintenance and complete physical. She feels fairly well. She reports exercising three times week. She reports she is sleeping well. She is happily married to her second husband. She has one stepson, a son, a daughter. She does have ongoing right hip pain that goes under right lateral leg worse  when she rolls over on that night. Immunization History  Administered Date(s) Administered  . Pneumococcal Conjugate-13 05/17/2014  . Pneumococcal Polysaccharide-23 04/11/2012  . Td 04/08/2010  . Tdap 04/11/2012  . Zoster 02/04/2009   02/28/16 Mammogram 09/25/10 BMD-Osteopenia 09/25/10 Pap-normal 10/06/16 Colonoscopy-tubular adenoma  Review of Systems  Constitutional: Positive for chills and diaphoresis.  HENT: Positive for congestion and postnasal drip.   Eyes: Positive for photophobia, redness and visual disturbance.  Respiratory: Positive for cough and shortness of breath.   Cardiovascular: Positive for palpitations.  Gastrointestinal: Positive for constipation and diarrhea.  Endocrine: Positive for cold intolerance.  Genitourinary: Positive for enuresis and flank pain.  Musculoskeletal: Positive for gait problem, myalgias, neck pain and neck stiffness.  Skin: Negative.   Allergic/Immunologic: Positive for environmental allergies.  Neurological: Positive for weakness.  Hematological: Negative.   Psychiatric/Behavioral: The patient is nervous/anxious.     Social History   Social History  . Marital status: Married    Spouse name: N/A  . Number of children: N/A  . Years of education: N/A   Occupational History  . Not on file.   Social History Main Topics  . Smoking status: Never Smoker   . Smokeless tobacco: Never Used     Comment: does not smoke  . Alcohol use 0.0 oz/week     Comment: rarely - wine  . Drug use: No  . Sexual activity: Not on file   Other Topics Concern  . Not on file   Social History Narrative   Married-2nd marriage. She has 2 children and her husband has 2 children.     Patient Active Problem List   Diagnosis Date Noted  . Allergic rhinitis 05/23/2015  . Arthropathy, lower leg 05/23/2015  . Benign neoplasm of colon 05/23/2015  . Clinical depression 05/23/2015  . Essential (primary) hypertension 05/23/2015  . Acid reflux 05/23/2015  . Anxiety, generalized 05/23/2015  . Hypercholesteremia 05/23/2015  . Blood glucose elevated 05/23/2015  . Symptomatic menopausal or female climacteric states 05/23/2015  . Arthritis, degenerative 05/23/2015  . Mixed incontinence 03/24/2013    Past Surgical History:  Procedure Laterality Date  . ABDOMINAL HYSTERECTOMY    . BREAST BIOPSY Right 2011  . BREAST BIOPSY Right 2012  . CERVICAL DISCECTOMY    . CHOLECYSTECTOMY    . COLONOSCOPY WITH PROPOFOL N/A 10/06/2016   Procedure: COLONOSCOPY WITH PROPOFOL;  Surgeon: Lollie Sails, MD;  Location: [facility] ENDOSCOPY;  Service: Endoscopy;  Laterality: N/A;  . EYE SURGERY    . hysterectomy-unspecified area      Her family history includes Bipolar disorder in her son; COPD in her mother; Colon cancer in her mother; Diabetes in her father; Heart attack in her mother; Heart failure in her father; Hypertension in her brother and son; Kidney Stones in her son; Kidney failure in her father; Macular degeneration in her mother; Melanoma in her son; Osteoporosis  in her mother; Ulcers in her mother.     Outpatient Encounter Prescriptions as of 12/08/2016  Medication Sig Note  . ALPRAZolam (XANAX) 0.5 MG tablet TAKE ONE TABLET BY MOUTH EVERY 12 HOURS AS NEEDED   . aspirin (ASPIRIN EC LO-DOSE) 81 MG EC tablet Take 81 mg by mouth daily.  05/23/2015: Received from: [facility]  . celecoxib (CELEBREX) 200 MG capsule Take 1 capsule (200 mg total) by mouth daily. (Patient not taking: Reported on 12/08/2016)   . diltiazem (DILACOR XR) 180 MG 24 hr capsule Take 1 capsule (180 mg total) by mouth daily.   Marland Kitchen doxycycline (VIBRA-TABS) 100 MG tablet Take 1 tablet (100 mg total) by mouth 2 (two) times daily. (Patient not taking: Reported on 12/08/2016)   . lisinopril (PRINIVIL,ZESTRIL) 40 MG tablet TAKE ONE TABLET BY MOUTH ONCE DAILY   . omeprazole (PRILOSEC) 20 MG capsule TAKE ONE CAPSULE BY MOUTH ONCE DAILY   . sertraline (ZOLOFT) 50 MG tablet Take 1 tablet (50 mg total) by mouth daily.    No facility-administered encounter medications on file as of 12/08/2016.     Patient Care Team: Jerrol Banana., MD as PCP - General (Family Medicine) Birder Robson, MD as Referring Physician (Ophthalmology)      Objective:   Vitals: There were no vitals filed for this visit.  Physical Exam  Constitutional: She is oriented to person, place, and time. She appears well-developed and well-nourished.  HENT:  Head: Normocephalic and atraumatic.  Right Ear: External ear normal.  Left Ear: External ear normal.  Nose: Nose normal.  Mouth/Throat: Oropharynx is clear and moist.  Eyes: Conjunctivae and EOM are normal. Pupils are equal, round, and reactive to light.  Neck: Normal range of motion. Neck supple.  Cardiovascular: Normal rate, regular rhythm, normal heart sounds and intact distal pulses.   Pulmonary/Chest: Effort normal.  Breast exam normal.  Abdominal: Soft. Bowel sounds are normal.  Musculoskeletal: Normal range of motion.  Neurological: She is alert and oriented to person, place, and time. No cranial nerve deficit. She exhibits normal muscle tone. Coordination normal.  Skin: Skin is warm and dry.  Psychiatric: She has a normal mood and affect. Her behavior is normal. Judgment and thought content normal.     Depression Screen PHQ 2/9 Scores  12/08/2016 07/22/2015  PHQ - 2 Score 1 0      Assessment & Plan:     Routine Health Maintenance and Physical Exam  Exercise Activities and Dietary recommendations Goals    . Increase water intake          Starting 12/08/16, I will work to increase my water intake to 4-5 glasses a day.       Immunization History  Administered Date(s) Administered  . Pneumococcal Conjugate-13 05/17/2014  . Pneumococcal Polysaccharide-23 04/11/2012  . Td 04/08/2010  . Tdap 04/11/2012  . Zoster 02/04/2009   Right hip and leg pain, right trochanteric bursitis Health Maintenance  Topic Date Due  . MAMMOGRAM  02/27/2018  . COLONOSCOPY  10/07/2019  . TETANUS/TDAP  04/11/2022  . INFLUENZA VACCINE  Completed  . DEXA SCAN  Completed  . ZOSTAVAX  Completed  . PNA vac Low Risk Adult  Completed    Pt declinces Pap and just had colonoscopy. Discussed health benefits of physical activity, and encouraged her to engage in regular exercise appropriate for her age and condition.   Right hip pain, likely right trochanteric bursitis, possible DDD, possible hip OA Treat with  NSAID  and refer to Ortho. HTN Chronic Anxiety Colonic Tubular Adenoma  I have done the exam and reviewed the chart and it is accurate to the best of my knowledge. Development worker, community has been used and  any errors in dictation or transcription are unintentional. Miguel Aschoff M.D. [facility]

## 2016-12-20 ENCOUNTER — Other Ambulatory Visit: Payer: Self-pay | Admitting: Family Medicine

## 2016-12-21 ENCOUNTER — Other Ambulatory Visit: Payer: Self-pay | Admitting: Family Medicine

## 2016-12-21 MED ORDER — DILTIAZEM HCL ER 180 MG PO CP24: 180.0000 mg | ORAL_CAPSULE | Freq: Every day | ORAL | 3 refills | Status: DC

## 2016-12-21 NOTE — Telephone Encounter (Signed)
LOV 12/08/2016. Emily Drozdowski, CMA  

## 2016-12-21 NOTE — Telephone Encounter (Signed)
Pt contacted office for refill request on the following medications:  diltiazem (DILACOR XR) 180 MG 24 hr capsule.  [place]  [id number]

## 2017-01-18 DIAGNOSIS — E7800 Pure hypercholesterolemia, unspecified: Secondary | ICD-10-CM | POA: Diagnosis not present

## 2017-01-18 DIAGNOSIS — I10 Essential (primary) hypertension: Secondary | ICD-10-CM | POA: Diagnosis not present

## 2017-01-19 LAB — CBC WITH DIFFERENTIAL/PLATELET
Basophils Absolute: 0 10*3/uL (ref 0.0–0.2)
Basos: 0 %
EOS (ABSOLUTE): 0.2 10*3/uL (ref 0.0–0.4)
Eos: 3 %
Hematocrit: 37.7 % (ref 34.0–46.6)
Hemoglobin: 12.4 g/dL (ref 11.1–15.9)
Immature Grans (Abs): 0 10*3/uL (ref 0.0–0.1)
Immature Granulocytes: 0 %
Lymphocytes Absolute: 1.4 10*3/uL (ref 0.7–3.1)
Lymphs: 22 %
MCH: 28.8 pg (ref 26.6–33.0)
MCHC: 32.9 g/dL (ref 31.5–35.7)
MCV: 88 fL (ref 79–97)
Monocytes Absolute: 0.4 10*3/uL (ref 0.1–0.9)
Monocytes: 7 %
Neutrophils Absolute: 4.4 10*3/uL (ref 1.4–7.0)
Neutrophils: 68 %
Platelets: 241 10*3/uL (ref 150–379)
RBC: 4.31 x10E6/uL (ref 3.77–5.28)
RDW: 14.4 % (ref 12.3–15.4)
WBC: 6.4 10*3/uL (ref 3.4–10.8)

## 2017-01-19 LAB — COMPREHENSIVE METABOLIC PANEL
ALT: 10 IU/L (ref 0–32)
AST: 11 IU/L (ref 0–40)
Albumin/Globulin Ratio: 1.8 (ref 1.2–2.2)
Albumin: 4.1 g/dL (ref 3.5–4.8)
Alkaline Phosphatase: 80 IU/L (ref 39–117)
BUN/Creatinine Ratio: 14 (ref 12–28)
BUN: 14 mg/dL (ref 8–27)
Bilirubin Total: 0.6 mg/dL (ref 0.0–1.2)
CO2: 24 mmol/L (ref 18–29)
Calcium: 9.4 mg/dL (ref 8.7–10.3)
Chloride: 104 mmol/L (ref 96–106)
Creatinine, Ser: 1 mg/dL (ref 0.57–1.00)
GFR calc Af Amer: 64 mL/min/{1.73_m2} (ref >59)
GFR calc non Af Amer: 56 mL/min/{1.73_m2} — ABNORMAL LOW (ref >59)
Globulin, Total: 2.3 g/dL (ref 1.5–4.5)
Glucose: 96 mg/dL (ref 65–99)
Potassium: 4.2 mmol/L (ref 3.5–5.2)
Sodium: 143 mmol/L (ref 134–144)
Total Protein: 6.4 g/dL (ref 6.0–8.5)

## 2017-01-19 LAB — LIPID PANEL WITH LDL/HDL RATIO
Cholesterol, Total: 196 mg/dL (ref 100–199)
HDL: 40 mg/dL (ref >39)
LDL Calculated: 123 mg/dL — ABNORMAL HIGH (ref 0–99)
LDl/HDL Ratio: 3.1 ratio units (ref 0.0–3.2)
Triglycerides: 165 mg/dL — ABNORMAL HIGH (ref 0–149)
VLDL Cholesterol Cal: 33 mg/dL (ref 5–40)

## 2017-01-19 LAB — TSH: TSH: 1.73 u[IU]/mL (ref 0.450–4.500)

## 2017-03-01 ENCOUNTER — Other Ambulatory Visit: Payer: Self-pay | Admitting: Family Medicine

## 2017-03-01 DIAGNOSIS — Z1231 Encounter for screening mammogram for malignant neoplasm of breast: Secondary | ICD-10-CM

## 2017-03-10 ENCOUNTER — Other Ambulatory Visit: Payer: Self-pay

## 2017-03-10 MED ORDER — LISINOPRIL 40 MG PO TABS: 40.0000 mg | ORAL_TABLET | Freq: Every day | ORAL | 3 refills | Status: DC

## 2017-03-18 ENCOUNTER — Ambulatory Visit
Admission: RE | Admit: 2017-03-18 | Discharge: 2017-03-18 | Disposition: A | Discharge status: Home / Self Care | Payer: PPO | Source: Ambulatory Visit | Attending: Family Medicine | Admitting: Family Medicine

## 2017-03-18 DIAGNOSIS — Z1231 Encounter for screening mammogram for malignant neoplasm of breast: Secondary | ICD-10-CM | POA: Insufficient documentation

## 2017-03-19 ENCOUNTER — Other Ambulatory Visit: Payer: Self-pay | Admitting: Family Medicine

## 2017-03-19 DIAGNOSIS — R928 Other abnormal and inconclusive findings on diagnostic imaging of breast: Secondary | ICD-10-CM

## 2017-03-19 DIAGNOSIS — R921 Mammographic calcification found on diagnostic imaging of breast: Secondary | ICD-10-CM

## 2017-03-24 ENCOUNTER — Ambulatory Visit (INDEPENDENT_AMBULATORY_CARE_PROVIDER_SITE_OTHER): Payer: PPO | Admitting: Family Medicine

## 2017-03-24 ENCOUNTER — Encounter: Payer: Self-pay | Admitting: Family Medicine

## 2017-03-24 VITALS — BP 142/68 | HR 72 | Temp 98.5°F | Resp 16 | Wt 183.0 lb

## 2017-03-24 DIAGNOSIS — N3000 Acute cystitis without hematuria: Secondary | ICD-10-CM | POA: Diagnosis not present

## 2017-03-24 DIAGNOSIS — J0100 Acute maxillary sinusitis, unspecified: Secondary | ICD-10-CM

## 2017-03-24 MED ORDER — DOXYCYCLINE HYCLATE 100 MG PO TABS: 100.0000 mg | ORAL_TABLET | Freq: Two times a day (BID) | ORAL | 0 refills | Status: DC

## 2017-03-24 NOTE — Progress Notes (Signed)
Patient: Rita Cruz Female    DOB: 1941-03-27   76 y.o.   MRN: [id number] Visit Date: 03/24/2017  Today's Provider: Wilhemena Durie, MD   Chief Complaint  Patient presents with  . not feeling well   Subjective:    HPI Patient reports that all of her symptoms started from a fall that she had 1 week ago. Patient reports that she has lower back pain and side pain due to her fall. She then states she developed urinary frequency about 2-3 days ago. Patient reports that she does occasionally have vaginal itching, but Monistat seems to help. Patient reports she just generally does not fell well.     Allergies  Allergen Reactions  . Percocet  [Oxycodone-Acetaminophen] Other (See Comments)    Hallucinations Hallucinations  . Levaquin [Levofloxacin In D5w] Diarrhea    Vaginal itching  . Meloxicam     Upset stomach  . Penicillins Itching     Current Outpatient Prescriptions:  .  ALPRAZolam (XANAX) 0.5 MG tablet, TAKE ONE TABLET BY MOUTH EVERY 12 HOURS AS NEEDED, Disp: 55 tablet, Rfl: 3 .  aspirin (ASPIRIN EC LO-DOSE) 81 MG EC tablet, Take 81 mg by mouth daily. , Disp: , Rfl:  .  celecoxib (CELEBREX) 200 MG capsule, Take 1 capsule (200 mg total) by mouth daily., Disp: 30 capsule, Rfl: 2 .  lisinopril (PRINIVIL,ZESTRIL) 40 MG tablet, Take 1 tablet (40 mg total) by mouth daily., Disp: 90 tablet, Rfl: 3 .  omeprazole (PRILOSEC) 20 MG capsule, TAKE ONE CAPSULE BY MOUTH ONCE DAILY, Disp: 90 capsule, Rfl: 3 .  sertraline (ZOLOFT) 50 MG tablet, Take 1 tablet (50 mg total) by mouth daily., Disp: 90 tablet, Rfl: 3 .  venlafaxine (EFFEXOR) 75 MG tablet, Take 75 mg by mouth 2 (two) times daily., Disp: , Rfl:  .  azithromycin (ZITHROMAX) 250 MG tablet, Take 2 on day 1 and then 1 daily till gone (Patient not taking: Reported on 03/24/2017), Disp: 6 tablet, Rfl: 0 .  celecoxib (CELEBREX) 200 MG capsule, Take 1 capsule (200 mg total) by mouth daily. (Patient not taking: Reported on  12/08/2016), Disp: 30 capsule, Rfl: 5 .  diltiazem (DILACOR XR) 180 MG 24 hr capsule, TAKE ONE CAPSULE BY MOUTH ONCE DAILY, Disp: 30 capsule, Rfl: 11 .  diltiazem (DILACOR XR) 180 MG 24 hr capsule, Take 1 capsule (180 mg total) by mouth daily., Disp: 90 capsule, Rfl: 3 .  doxycycline (VIBRA-TABS) 100 MG tablet, Take 1 tablet (100 mg total) by mouth 2 (two) times daily. (Patient not taking: Reported on 12/08/2016), Disp: 20 tablet, Rfl: 0  Review of Systems  Constitutional: Positive for activity change, chills, fatigue and fever.  HENT: Positive for congestion and postnasal drip.   Respiratory: Negative.   Cardiovascular: Negative.   Endocrine: Negative.   Genitourinary: Positive for frequency, urgency and vaginal discharge.  Musculoskeletal: Positive for arthralgias and myalgias.  Allergic/Immunologic: Negative.   Psychiatric/Behavioral: Negative.     Social History  Substance Use Topics  . Smoking status: Never Smoker  . Smokeless tobacco: Never Used     Comment: does not smoke  . Alcohol use 0.0 oz/week     Comment: rarely - wine   Objective:   BP (!) 142/68 (BP Location: Left Arm, Patient Position: Sitting, Cuff Size: Normal)   Pulse 72   Temp 98.5 F (36.9 C)   Resp 16   Wt 183 lb (83 kg)   SpO2 97%   BMI  27.02 kg/m  Vitals:   03/24/17 1607  BP: (!) 142/68  Pulse: 72  Resp: 16  Temp: 98.5 F (36.9 C)  SpO2: 97%  Weight: 183 lb (83 kg)     Physical Exam  Constitutional: She is oriented to person, place, and time. She appears well-developed and well-nourished.  HENT:  Head: Normocephalic and atraumatic.  Right Ear: External ear normal.  Left Ear: External ear normal.  Nose: Nose normal.  Mouth/Throat: Oropharynx is clear and moist.  No exudate on tonsils. Maxillary sinuses tender.   Eyes: Conjunctivae and EOM are normal. Pupils are equal, round, and reactive to light.  Cardiovascular: Normal rate, regular rhythm and normal heart sounds.   Pulmonary/Chest:  Effort normal and breath sounds normal. She exhibits crepitus. Right breast exhibits no inverted nipple, no mass, no nipple discharge, no skin change and no tenderness. Left breast exhibits no inverted nipple, no mass, no nipple discharge, no skin change and no tenderness. Breasts are symmetrical.  No Crepitus noted on the upper left lateral area. Breast exam normal. Dense tissue noted in both breast.   Abdominal: Soft.  Neurological: She is alert and oriented to person, place, and time.  Skin: Skin is warm and dry.  Psychiatric: She has a normal mood and affect. Her behavior is normal. Judgment and thought content normal.        Assessment & Plan:     1. Acute maxillary sinusitis, recurrence not specified   2. Acute cystitis without hematuria Check culture. 3.Chronic Anxiety         I have done the exam and reviewed the above chart and it is accurate to the best of my knowledge. Development worker, community has been used in this note in any air is in the dictation or transcription are unintentional. I have done the exam and reviewed the above chart and it is accurate to the best of my knowledge. Development worker, community has been used in this note in any air is in the dictation or transcription are unintentional.  Wilhemena Durie, MD  [employer]

## 2017-03-24 NOTE — Patient Instructions (Addendum)
Start generic Claritin (Loratadine 10mg ) daily for allergies.  Start Robitussin over the counter for cough.

## 2017-03-27 LAB — URINE CULTURE

## 2017-03-29 ENCOUNTER — Telehealth: Payer: Self-pay | Admitting: Family Medicine

## 2017-03-29 NOTE — Telephone Encounter (Signed)
Pt is returning call.  DC#[phone]

## 2017-03-29 NOTE — Telephone Encounter (Signed)
Patient advised of urine culture results. Also was checking on the patient to see how she was feeling. Patient was seen on 03/24/17. She was placed on Doxycycline. Patient felt worse that night and had 102 temp and the following day felt really sick and almost went to the ER. She is feeling better now from last week. She was in bed every day since 03/24/17 until today. Symptoms are chest congestion, head congestion, hoarse. She tried Tussin DM she had at home with not much help but has been taking Mucinex DM now and this has helped her, the urge to cough has subsided some. Overall states is better. I just wanted to let you know in case something else needs to be done or just to follow at this time.-aa

## 2017-03-30 ENCOUNTER — Ambulatory Visit
Admission: RE | Admit: 2017-03-30 | Discharge: 2017-03-30 | Disposition: A | Discharge status: Home / Self Care | Payer: PPO | Source: Ambulatory Visit | Attending: Family Medicine | Admitting: Family Medicine

## 2017-03-30 DIAGNOSIS — R928 Other abnormal and inconclusive findings on diagnostic imaging of breast: Secondary | ICD-10-CM

## 2017-03-30 DIAGNOSIS — R921 Mammographic calcification found on diagnostic imaging of breast: Secondary | ICD-10-CM

## 2017-03-30 DIAGNOSIS — Z86018 Personal history of other benign neoplasm: Secondary | ICD-10-CM | POA: Diagnosis not present

## 2017-03-30 DIAGNOSIS — Z872 Personal history of diseases of the skin and subcutaneous tissue: Secondary | ICD-10-CM | POA: Diagnosis not present

## 2017-03-30 DIAGNOSIS — L720 Epidermal cyst: Secondary | ICD-10-CM | POA: Diagnosis not present

## 2017-03-30 NOTE — Telephone Encounter (Signed)
I expect each day should be a little better.

## 2017-04-01 ENCOUNTER — Ambulatory Visit (INDEPENDENT_AMBULATORY_CARE_PROVIDER_SITE_OTHER): Payer: PPO | Admitting: Physician Assistant

## 2017-04-01 ENCOUNTER — Encounter: Payer: Self-pay | Admitting: Physician Assistant

## 2017-04-01 VITALS — BP 116/64 | HR 77 | Temp 97.7°F | Resp 16 | Wt 177.0 lb

## 2017-04-01 DIAGNOSIS — J0100 Acute maxillary sinusitis, unspecified: Secondary | ICD-10-CM | POA: Diagnosis not present

## 2017-04-01 DIAGNOSIS — N3000 Acute cystitis without hematuria: Secondary | ICD-10-CM | POA: Diagnosis not present

## 2017-04-01 DIAGNOSIS — J40 Bronchitis, not specified as acute or chronic: Secondary | ICD-10-CM

## 2017-04-01 DIAGNOSIS — R300 Dysuria: Secondary | ICD-10-CM

## 2017-04-01 LAB — POCT URINALYSIS DIPSTICK
Bilirubin, UA: NEGATIVE
Blood, UA: NEGATIVE
Glucose, UA: NEGATIVE
Ketones, UA: NEGATIVE
Leukocytes, UA: NEGATIVE
Nitrite, UA: NEGATIVE
Spec Grav, UA: 1.025 (ref 1.010–1.025)
Urobilinogen, UA: 0.2 E.U./dL
pH, UA: 6 (ref 5.0–8.0)

## 2017-04-01 MED ORDER — HYDROCODONE-HOMATROPINE 5-1.5 MG/5ML PO SYRP: 5.0000 mL | ORAL_SOLUTION | Freq: Three times a day (TID) | ORAL | 0 refills | Status: DC | PRN

## 2017-04-01 MED ORDER — SULFAMETHOXAZOLE-TRIMETHOPRIM 800-160 MG PO TABS: 1.0000 | ORAL_TABLET | Freq: Two times a day (BID) | ORAL | 0 refills | Status: DC

## 2017-04-01 MED ORDER — PREDNISONE 10 MG PO TABS: ORAL_TABLET | ORAL | 0 refills | Status: DC

## 2017-04-01 NOTE — Patient Instructions (Signed)

## 2017-04-01 NOTE — Progress Notes (Signed)
Patient: Rita Cruz Female    DOB: Nov 25, 1940   76 y.o.   MRN: [id number] Visit Date: 04/01/2017  Today's Provider: Mar Daring, PA-C   Chief Complaint  Patient presents with  . URI   Subjective:    URI   This is a new problem. The current episode started 1 to 4 weeks ago (Pt was seen on 03/24/2017 by Dr. Rosanna Randy for maxillary sinusitis. Pt was prescribed Doxy x 7 days). The maximum temperature recorded prior to her arrival was 102 - 102.9 F (before being treated on 03/24/2017). Associated symptoms include chest pain (tender under left breast due to coughing. Pt recently fell and landed on that area, and heard a "pop"), congestion, coughing (productive of clear, thick sputum), dysuria (pt had urine culture performed at [facility]; the cx was positive for klebsiella infection. Pt states she is still experiencing dysuria, and believes this is a yeast infection. Pt has tried [employer], which caused "burning"), ear pain (right), rhinorrhea, sneezing and wheezing. Pertinent negatives include no abdominal pain, headaches, nausea, neck pain, plugged ear sensation, sinus pain, sore throat, swollen glands or vomiting. Treatments tried: Tylenol, Doxy, Mucinex DM. The treatment provided mild relief.       Allergies  Allergen Reactions  . Percocet  [Oxycodone-Acetaminophen] Other (See Comments)    Hallucinations Hallucinations  . Levaquin [Levofloxacin In D5w] Diarrhea    Vaginal itching  . Meloxicam     Upset stomach  . Penicillins Itching     Current Outpatient Prescriptions:  .  ALPRAZolam (XANAX) 0.5 MG tablet, TAKE ONE TABLET BY MOUTH EVERY 12 HOURS AS NEEDED, Disp: 55 tablet, Rfl: 3 .  aspirin (ASPIRIN EC LO-DOSE) 81 MG EC tablet, Take 81 mg by mouth daily. , Disp: , Rfl:  .  diltiazem (DILACOR XR) 180 MG 24 hr capsule, TAKE ONE CAPSULE BY MOUTH ONCE DAILY, Disp: 30 capsule, Rfl: 11 .  lisinopril (PRINIVIL,ZESTRIL) 40 MG tablet, Take 1 tablet (40 mg total) by mouth  daily., Disp: 90 tablet, Rfl: 3 .  omeprazole (PRILOSEC) 20 MG capsule, TAKE ONE CAPSULE BY MOUTH ONCE DAILY, Disp: 90 capsule, Rfl: 3 .  sertraline (ZOLOFT) 50 MG tablet, Take 1 tablet (50 mg total) by mouth daily., Disp: 90 tablet, Rfl: 3 .  celecoxib (CELEBREX) 200 MG capsule, Take 1 capsule (200 mg total) by mouth daily. (Patient not taking: Reported on 12/08/2016), Disp: 30 capsule, Rfl: 5  Review of Systems  Constitutional: Positive for chills and fatigue. Negative for fever.  HENT: Positive for congestion, ear pain (right), postnasal drip, rhinorrhea and sneezing. Negative for sinus pain, sinus pressure and sore throat.   Respiratory: Positive for cough (productive of clear, thick sputum) and wheezing. Negative for chest tightness and shortness of breath.   Cardiovascular: Positive for chest pain (tender under left breast due to coughing. Pt recently fell and landed on that area, and heard a "pop"). Negative for palpitations and leg swelling.  Gastrointestinal: Negative for abdominal pain, nausea and vomiting.  Genitourinary: Positive for dysuria (pt had urine culture performed at [facility]; the cx was positive for klebsiella infection. Pt states she is still experiencing dysuria, and believes this is a yeast infection. Pt has tried [employer], which caused "burning").  Musculoskeletal: Negative for neck pain.  Neurological: Negative for dizziness and headaches.    Social History  Substance Use Topics  . Smoking status: Never Smoker  . Smokeless tobacco: Never Used     Comment: does not smoke  .  Alcohol use 0.0 oz/week     Comment: rarely - wine   Objective:   BP 116/64 (BP Location: Left Arm, Patient Position: Sitting, Cuff Size: Normal)   Pulse 77   Temp 97.7 F (36.5 C) (Oral)   Resp 16   Wt 177 lb (80.3 kg)   SpO2 98%   BMI 26.14 kg/m  Vitals:   04/01/17 1520  BP: 116/64  Pulse: 77  Resp: 16  Temp: 97.7 F (36.5 C)  TempSrc: Oral  SpO2: 98%  Weight: 177 lb (80.3 kg)      Physical Exam  Constitutional: She is oriented to person, place, and time. She appears well-developed and well-nourished. No distress.  HENT:  Head: Normocephalic and atraumatic.  Right Ear: Hearing, tympanic membrane, external ear and ear canal normal.  Left Ear: Hearing, tympanic membrane, external ear and ear canal normal.  Nose: Mucosal edema and rhinorrhea present. Right sinus exhibits maxillary sinus tenderness and frontal sinus tenderness. Left sinus exhibits maxillary sinus tenderness and frontal sinus tenderness.  Mouth/Throat: Uvula is midline, oropharynx is clear and moist and mucous membranes are normal. No oropharyngeal exudate, posterior oropharyngeal edema or posterior oropharyngeal erythema.  Eyes: Conjunctivae are normal. Pupils are equal, round, and reactive to light. Right eye exhibits no discharge. Left eye exhibits no discharge. No scleral icterus.  Neck: Normal range of motion. Neck supple. No tracheal deviation present. No thyromegaly present.  Cardiovascular: Normal rate, regular rhythm and normal heart sounds.  Exam reveals no gallop and no friction rub.   No murmur heard. Pulmonary/Chest: Effort normal. No stridor. No respiratory distress. She has wheezes (scattered diffusely throughout, no consolidation). She has no rales.  Abdominal: Soft. Normal appearance and bowel sounds are normal. She exhibits no distension and no mass. There is no hepatosplenomegaly. There is tenderness in the suprapubic area. There is no rebound, no guarding and no CVA tenderness.  Suprapubic pressure not tenderness  Lymphadenopathy:    She has no cervical adenopathy.  Neurological: She is alert and oriented to person, place, and time.  Skin: Skin is warm and dry. She is not diaphoretic.  Vitals reviewed.       Assessment & Plan:     1. Dysuria UA positive.  - POCT Urinalysis Dipstick  2. Bronchitis Worsening symptoms that have not responded to OTC medications. Will give  Bactrim as below. Prednisone taper given for breathing. Worsening symptoms that has not responded to OTC medications. Will give Hycodan cough syrup as below for nighttime cough. Drowsiness precautions given to patient. Stay well hydrated. Use delsym, robitussin OR mucinex for daytime cough. Call if no symptom improvement or if symptoms worsen. - sulfamethoxazole-trimethoprim (BACTRIM DS,SEPTRA DS) 800-160 MG tablet; Take 1 tablet by mouth 2 (two) times daily.  Dispense: 20 tablet; Refill: 0 - predniSONE (DELTASONE) 10 MG tablet; Take 6 tabs PO on day 1&2, 5 tabs PO on day 3&4, 4 tabs PO on day 5&6, 3 tabs PO on day 7&8, 2 tabs PO on day 9&10, 1 tab PO on day 11&12.  Dispense: 42 tablet; Refill: 0 - HYDROcodone-homatropine (HYCODAN) 5-1.5 MG/5ML syrup; Take 5 mLs by mouth every 8 (eight) hours as needed for cough.  Dispense: 180 mL; Refill: 0  3. Acute cystitis without hematuria Worsening symptoms. UA positive. Will treat empirically with Bactrim as below.  Continue to push fluids. Urine sent for culture. Will follow up pending C&S results. She is to call if symptoms do not improve or if they worsen.  - sulfamethoxazole-trimethoprim (BACTRIM  DS,SEPTRA DS) 800-160 MG tablet; Take 1 tablet by mouth 2 (two) times daily.  Dispense: 20 tablet; Refill: 0  4. Acute maxillary sinusitis, recurrence not specified See above medical treatment plan. - sulfamethoxazole-trimethoprim (BACTRIM DS,SEPTRA DS) 800-160 MG tablet; Take 1 tablet by mouth 2 (two) times daily.  Dispense: 20 tablet; Refill: 0       Mar Daring, PA-C  [facility]

## 2017-06-07 ENCOUNTER — Ambulatory Visit (INDEPENDENT_AMBULATORY_CARE_PROVIDER_SITE_OTHER): Payer: PPO | Admitting: Family Medicine

## 2017-06-07 VITALS — BP 124/78 | HR 76 | Resp 16 | Wt 180.0 lb

## 2017-06-07 DIAGNOSIS — M25551 Pain in right hip: Secondary | ICD-10-CM | POA: Diagnosis not present

## 2017-06-07 MED ORDER — MELOXICAM 7.5 MG PO TABS: 7.5000 mg | ORAL_TABLET | Freq: Two times a day (BID) | ORAL | 0 refills | Status: DC | PRN

## 2017-06-07 NOTE — Progress Notes (Signed)
Rita Cruz  MRN: [id number] DOB: 10/28/1941  Subjective:  HPI   The patient is a 76 year old female who presents for follow up of her right hip pain.  She states she is still having pain in the right hip that sometimes radiates into the groin.  She also has pain in her back and weakness in the right leg.  The patient had been taking Celebrex and states that it helped while she was on it.  Patient Active Problem List   Diagnosis Date Noted  . Allergic rhinitis 05/23/2015  . Arthropathy, lower leg 05/23/2015  . Benign neoplasm of colon 05/23/2015  . Clinical depression 05/23/2015  . Essential (primary) hypertension 05/23/2015  . Acid reflux 05/23/2015  . Anxiety, generalized 05/23/2015  . Hypercholesteremia 05/23/2015  . Blood glucose elevated 05/23/2015  . Symptomatic menopausal or female climacteric states 05/23/2015  . Arthritis, degenerative 05/23/2015  . Mixed incontinence 03/24/2013    Past Medical History:  Diagnosis Date  . Adenomatous polyps   . Anxiety   . Arthritis    neck  . Chest pain    Adenosine Myoview in 2006 done for atypical chest pain, showed no eviden e for ischemia or infarct  . Colon polyps   . GERD (gastroesophageal reflux disease)   . History of colonic polyps   . HTN (hypertension)   . Hyperlipidemia   . PUD (peptic ulcer disease)    with hx of H. Pylori    Social History   Social History  . Marital status: Married    Spouse name: N/A  . Number of children: N/A  . Years of education: N/A   Occupational History  . Not on file.   Social History Main Topics  . Smoking status: Never Smoker  . Smokeless tobacco: Never Used     Comment: does not smoke  . Alcohol use 0.0 oz/week     Comment: rarely - wine  . Drug use: No  . Sexual activity: Not on file   Other Topics Concern  . Not on file   Social History Narrative   Married-2nd marriage. She has 2 children and her husband has 2 children.     Outpatient Encounter  Prescriptions as of 06/07/2017  Medication Sig Note  . ALPRAZolam (XANAX) 0.5 MG tablet TAKE ONE TABLET BY MOUTH EVERY 12 HOURS AS NEEDED   . aspirin (ASPIRIN EC LO-DOSE) 81 MG EC tablet Take 81 mg by mouth daily.  05/23/2015: Received from: [facility]  . diltiazem (DILACOR XR) 180 MG 24 hr capsule TAKE ONE CAPSULE BY MOUTH ONCE DAILY   . lisinopril (PRINIVIL,ZESTRIL) 40 MG tablet Take 1 tablet (40 mg total) by mouth daily.   Marland Kitchen omeprazole (PRILOSEC) 20 MG capsule TAKE ONE CAPSULE BY MOUTH ONCE DAILY   . sertraline (ZOLOFT) 50 MG tablet Take 1 tablet (50 mg total) by mouth daily.   . [DISCONTINUED] celecoxib (CELEBREX) 200 MG capsule Take 1 capsule (200 mg total) by mouth daily. (Patient not taking: Reported on 12/08/2016)   . [DISCONTINUED] HYDROcodone-homatropine (HYCODAN) 5-1.5 MG/5ML syrup Take 5 mLs by mouth every 8 (eight) hours as needed for cough.   . [DISCONTINUED] predniSONE (DELTASONE) 10 MG tablet Take 6 tabs PO on day 1&2, 5 tabs PO on day 3&4, 4 tabs PO on day 5&6, 3 tabs PO on day 7&8, 2 tabs PO on day 9&10, 1 tab PO on day 11&12.   . [DISCONTINUED] sulfamethoxazole-trimethoprim (BACTRIM DS,SEPTRA DS) 800-160 MG tablet Take 1  tablet by mouth 2 (two) times daily.    No facility-administered encounter medications on file as of 06/07/2017.     Allergies  Allergen Reactions  . Percocet  [Oxycodone-Acetaminophen] Other (See Comments)    Hallucinations Hallucinations  . Levaquin [Levofloxacin In D5w] Diarrhea    Vaginal itching  . Meloxicam     Upset stomach  . Penicillins Itching    Review of Systems  Constitutional: Negative.   Respiratory: Positive for cough and sputum production (clear).   Cardiovascular: Negative for chest pain, palpitations and orthopnea.  Gastrointestinal: Negative.   Musculoskeletal: Positive for back pain, joint pain, myalgias and neck pain.  Skin: Negative.   Endo/Heme/Allergies: Negative.   Psychiatric/Behavioral: Negative.       Objective:  BP 124/78 (BP Location: Right Arm, Patient Position: Sitting, Cuff Size: Normal)   Pulse 76   Resp 16   Wt 180 lb (81.6 kg)   BMI 26.58 kg/m   Physical Exam  Constitutional: She is oriented to person, place, and time and well-developed, well-nourished, and in no distress.  HENT:  Head: Normocephalic and atraumatic.  Right Ear: External ear normal.  Nose: Nose normal.  Eyes: Pupils are equal, round, and reactive to light. No scleral icterus.  Neck: No thyromegaly present.  Cardiovascular: Normal rate, regular rhythm and normal heart sounds.   Pulmonary/Chest: Effort normal and breath sounds normal.  Musculoskeletal: She exhibits tenderness.  Pain with figure four  Neurological: She is alert and oriented to person, place, and time. GCS score is 15.  Skin: Skin is warm and dry.  Psychiatric: Mood, memory, affect and judgment normal.    Assessment and Plan :   1. Pain of right hip joint  - Ambulatory referral to Orthopedic Surgery - meloxicam (MOBIC) 7.5 MG tablet; Take 1 tablet (7.5 mg total) by mouth 2 (two) times daily as needed for pain.  Dispense: 60 tablet; Refill: 0 2.Low Back Pain If she needs referral would start with orthopedic referral for evaluation of her hip and right greater trochanter    HPI, Exam and A&P Transcribed under the direction and in the presence of Wilhemena Durie., MD. Electronically Signed: Althea Charon, RMA I have done the exam and reviewed the chart and it is accurate to the best of my knowledge. Development worker, community has been used and  any errors in dictation or transcription are unintentional. Miguel Aschoff M.D. [facility]

## 2017-06-15 DIAGNOSIS — M7071 Other bursitis of hip, right hip: Secondary | ICD-10-CM | POA: Diagnosis not present

## 2017-06-28 ENCOUNTER — Other Ambulatory Visit: Payer: Self-pay | Admitting: Family Medicine

## 2017-06-28 NOTE — Telephone Encounter (Signed)
RX called in-aa 

## 2017-06-28 NOTE — Telephone Encounter (Signed)
Please review for Dr Gilbert-aa 

## 2017-06-29 DIAGNOSIS — M25551 Pain in right hip: Secondary | ICD-10-CM | POA: Diagnosis not present

## 2017-06-29 DIAGNOSIS — M7061 Trochanteric bursitis, right hip: Secondary | ICD-10-CM | POA: Diagnosis not present

## 2017-06-29 DIAGNOSIS — M6281 Muscle weakness (generalized): Secondary | ICD-10-CM | POA: Diagnosis not present

## 2017-07-08 DIAGNOSIS — M7061 Trochanteric bursitis, right hip: Secondary | ICD-10-CM | POA: Diagnosis not present

## 2017-07-08 DIAGNOSIS — M25551 Pain in right hip: Secondary | ICD-10-CM | POA: Diagnosis not present

## 2017-07-08 DIAGNOSIS — M6281 Muscle weakness (generalized): Secondary | ICD-10-CM | POA: Diagnosis not present

## 2017-07-15 DIAGNOSIS — M6281 Muscle weakness (generalized): Secondary | ICD-10-CM | POA: Diagnosis not present

## 2017-07-15 DIAGNOSIS — M7061 Trochanteric bursitis, right hip: Secondary | ICD-10-CM | POA: Diagnosis not present

## 2017-07-15 DIAGNOSIS — M25551 Pain in right hip: Secondary | ICD-10-CM | POA: Diagnosis not present

## 2017-08-02 DIAGNOSIS — H26492 Other secondary cataract, left eye: Secondary | ICD-10-CM | POA: Diagnosis not present

## 2017-08-03 DIAGNOSIS — M7061 Trochanteric bursitis, right hip: Secondary | ICD-10-CM | POA: Diagnosis not present

## 2017-08-03 DIAGNOSIS — M6281 Muscle weakness (generalized): Secondary | ICD-10-CM | POA: Diagnosis not present

## 2017-08-03 DIAGNOSIS — M25551 Pain in right hip: Secondary | ICD-10-CM | POA: Diagnosis not present

## 2017-08-06 DIAGNOSIS — H26492 Other secondary cataract, left eye: Secondary | ICD-10-CM | POA: Diagnosis not present

## 2017-08-24 ENCOUNTER — Telehealth: Payer: Self-pay | Admitting: Family Medicine

## 2017-08-24 DIAGNOSIS — R05 Cough: Secondary | ICD-10-CM | POA: Diagnosis not present

## 2017-08-24 DIAGNOSIS — J309 Allergic rhinitis, unspecified: Secondary | ICD-10-CM | POA: Diagnosis not present

## 2017-08-24 DIAGNOSIS — R928 Other abnormal and inconclusive findings on diagnostic imaging of breast: Secondary | ICD-10-CM

## 2017-08-24 DIAGNOSIS — J329 Chronic sinusitis, unspecified: Secondary | ICD-10-CM | POA: Diagnosis not present

## 2017-08-24 NOTE — Telephone Encounter (Signed)
Ok to do?-Ascencion Coye V Roda Lauture, RMA  

## 2017-08-24 NOTE — Telephone Encounter (Signed)
Pt states she has a nomogram 6 months ago and [facility] is requesting a follow up on her right breast.  Pt states [facility] is requesting an order to have the additional view done.  CB#[phone] or [phone]

## 2017-08-24 NOTE — Telephone Encounter (Signed)
Order for Diagnostic mammogram placed.Patient advised.

## 2017-08-24 NOTE — Telephone Encounter (Signed)
ok 

## 2017-08-26 DIAGNOSIS — J301 Allergic rhinitis due to pollen: Secondary | ICD-10-CM | POA: Diagnosis not present

## 2017-08-27 DIAGNOSIS — M5136 Other intervertebral disc degeneration, lumbar region: Secondary | ICD-10-CM | POA: Diagnosis not present

## 2017-08-27 DIAGNOSIS — M1611 Unilateral primary osteoarthritis, right hip: Secondary | ICD-10-CM | POA: Diagnosis not present

## 2017-08-27 DIAGNOSIS — M47816 Spondylosis without myelopathy or radiculopathy, lumbar region: Secondary | ICD-10-CM | POA: Diagnosis not present

## 2017-09-03 DIAGNOSIS — H26491 Other secondary cataract, right eye: Secondary | ICD-10-CM | POA: Diagnosis not present

## 2017-09-13 ENCOUNTER — Other Ambulatory Visit: Payer: Self-pay | Admitting: Family Medicine

## 2017-09-13 ENCOUNTER — Telehealth: Payer: Self-pay

## 2017-09-13 MED ORDER — AZITHROMYCIN 250 MG PO TABS: ORAL_TABLET | ORAL | 0 refills | Status: DC

## 2017-09-13 NOTE — Telephone Encounter (Signed)
Patient is traveling and requests Zpak just in case. Per dr Rosanna Randy this is ok but not to take this unless really has to.-Karyss Frese V Fabrice Dyal, RMA

## 2017-09-14 DIAGNOSIS — R05 Cough: Secondary | ICD-10-CM | POA: Diagnosis not present

## 2017-09-14 DIAGNOSIS — J309 Allergic rhinitis, unspecified: Secondary | ICD-10-CM | POA: Diagnosis not present

## 2017-09-14 DIAGNOSIS — R51 Headache: Secondary | ICD-10-CM | POA: Diagnosis not present

## 2017-09-30 ENCOUNTER — Other Ambulatory Visit: Payer: Self-pay | Admitting: Family Medicine

## 2017-09-30 ENCOUNTER — Ambulatory Visit
Admission: RE | Admit: 2017-09-30 | Discharge: 2017-09-30 | Disposition: A | Discharge status: Home / Self Care | Payer: PPO | Source: Ambulatory Visit | Attending: Family Medicine | Admitting: Family Medicine

## 2017-09-30 DIAGNOSIS — R928 Other abnormal and inconclusive findings on diagnostic imaging of breast: Secondary | ICD-10-CM

## 2017-09-30 DIAGNOSIS — R921 Mammographic calcification found on diagnostic imaging of breast: Secondary | ICD-10-CM | POA: Diagnosis not present

## 2017-10-10 ENCOUNTER — Other Ambulatory Visit: Payer: Self-pay | Admitting: Family Medicine

## 2017-10-25 ENCOUNTER — Ambulatory Visit: Payer: PPO | Admitting: Family Medicine

## 2017-10-25 VITALS — BP 142/76 | HR 76 | Resp 16 | Wt 178.0 lb

## 2017-10-25 DIAGNOSIS — R058 Other specified cough: Secondary | ICD-10-CM

## 2017-10-25 DIAGNOSIS — I10 Essential (primary) hypertension: Secondary | ICD-10-CM | POA: Diagnosis not present

## 2017-10-25 DIAGNOSIS — R05 Cough: Secondary | ICD-10-CM | POA: Diagnosis not present

## 2017-10-25 DIAGNOSIS — E7800 Pure hypercholesterolemia, unspecified: Secondary | ICD-10-CM | POA: Diagnosis not present

## 2017-10-25 DIAGNOSIS — T464X5A Adverse effect of angiotensin-converting-enzyme inhibitors, initial encounter: Secondary | ICD-10-CM | POA: Diagnosis not present

## 2017-10-25 NOTE — Progress Notes (Signed)
Rita Cruz  MRN: [id number] DOB: Feb 03, 1941  Subjective:  HPI  Patient is here for follow up on b/p and dry cough. Patient has been off Lisinopril for about 2 months. Cough is much better then it was while she was on the medication. Does have a cough from drainage when laying down on her back sometimes. She has not been checking her b/p at home.  BP Readings from Last 3 Encounters:  10/25/17 (!) 142/76  06/07/17 124/78  04/01/17 116/64   Wt Readings from Last 3 Encounters:  10/25/17 178 lb (80.7 kg)  06/07/17 180 lb (81.6 kg)  04/01/17 177 lb (80.3 kg)    Patient Active Problem List   Diagnosis Date Noted  . Allergic rhinitis 05/23/2015  . Arthropathy, lower leg 05/23/2015  . Benign neoplasm of colon 05/23/2015  . Clinical depression 05/23/2015  . Essential (primary) hypertension 05/23/2015  . Acid reflux 05/23/2015  . Anxiety, generalized 05/23/2015  . Hypercholesteremia 05/23/2015  . Blood glucose elevated 05/23/2015  . Symptomatic menopausal or female climacteric states 05/23/2015  . Arthritis, degenerative 05/23/2015  . Mixed incontinence 03/24/2013    Past Medical History:  Diagnosis Date  . Adenomatous polyps   . Anxiety   . Arthritis    neck  . Chest pain    Adenosine Myoview in 2006 done for atypical chest pain, showed no eviden e for ischemia or infarct  . Colon polyps   . GERD (gastroesophageal reflux disease)   . History of colonic polyps   . HTN (hypertension)   . Hyperlipidemia   . PUD (peptic ulcer disease)    with hx of H. Pylori    Social History   Socioeconomic History  . Marital status: Married    Spouse name: Not on file  . Number of children: Not on file  . Years of education: Not on file  . Highest education level: Not on file  Social Needs  . Financial resource strain: Not on file  . Food insecurity - worry: Not on file  . Food insecurity - inability: Not on file  . Transportation needs - medical: Not on file  .  Transportation needs - non-medical: Not on file  Occupational History  . Not on file  Tobacco Use  . Smoking status: Never Smoker  . Smokeless tobacco: Never Used  . Tobacco comment: does not smoke  Substance and Sexual Activity  . Alcohol use: Yes    Alcohol/week: 0.0 oz    Comment: rarely - wine  . Drug use: No  . Sexual activity: Not on file  Other Topics Concern  . Not on file  Social History Narrative   Married-2nd marriage. She has 2 children and her husband has 2 children.     Outpatient Encounter Medications as of 10/25/2017  Medication Sig Note  . ALPRAZolam (XANAX) 0.5 MG tablet TAKE ONE TABLET BY MOUTH EVERY 12 HOURS AS NEEDED   . aspirin (ASPIRIN EC LO-DOSE) 81 MG EC tablet Take 81 mg by mouth daily.  05/23/2015: Received from: [facility] [facility]  . azithromycin (ZITHROMAX) 250 MG tablet As directed   . diltiazem (DILACOR XR) 180 MG 24 hr capsule TAKE ONE CAPSULE BY MOUTH ONCE DAILY   . meloxicam (MOBIC) 7.5 MG tablet Take 1 tablet (7.5 mg total) by mouth 2 (two) times daily as needed for pain.   Marland Kitchen omeprazole (PRILOSEC) 20 MG capsule TAKE ONE CAPSULE BY MOUTH ONCE DAILY   . sertraline (ZOLOFT) 50 MG tablet TAKE ONE  TABLET BY MOUTH ONCE DAILY   . [DISCONTINUED] lisinopril (PRINIVIL,ZESTRIL) 40 MG tablet Take 1 tablet (40 mg total) by mouth daily.    No facility-administered encounter medications on file as of 10/25/2017.     Allergies  Allergen Reactions  . Percocet  [Oxycodone-Acetaminophen] Other (See Comments)    Hallucinations Hallucinations  . Levaquin [Levofloxacin In D5w] Diarrhea    Vaginal itching  . Lisinopril     cough  . Meloxicam     Upset stomach  . Penicillins Itching    Review of Systems  Constitutional: Negative.   Eyes: Negative.   Respiratory: Positive for cough. Negative for hemoptysis, sputum production, shortness of breath and wheezing.   Cardiovascular: Negative.   Gastrointestinal: Negative.   Musculoskeletal: Negative.     Skin: Negative.   Neurological: Negative.   Endo/Heme/Allergies: Negative.   Psychiatric/Behavioral: Negative.     Objective:  BP (!) 142/76   Pulse 76   Resp 16   Wt 178 lb (80.7 kg)   BMI 26.29 kg/m   Physical Exam  Constitutional: She is oriented to person, place, and time and well-developed, well-nourished, and in no distress.  HENT:  Head: Normocephalic and atraumatic.  Right Ear: External ear normal.  Left Ear: External ear normal.  Nose: Nose normal.  Eyes: Conjunctivae are normal. No scleral icterus.  Neck: No thyromegaly present.  Cardiovascular: Normal rate, regular rhythm and normal heart sounds.  Pulmonary/Chest: Breath sounds normal.  Abdominal: Soft.  Neurological: She is alert and oriented to person, place, and time.  Skin: Skin is warm and dry.  Psychiatric: Mood, memory, affect and judgment normal.    Assessment and Plan :  Cough secondary to ACEI GERD HTN Anxiety  I have done the exam and reviewed the chart and it is accurate to the best of my knowledge. Development worker, community has been used and  any errors in dictation or transcription are unintentional. Miguel Aschoff M.D. [facility]

## 2017-11-18 DIAGNOSIS — M7751 Other enthesopathy of right foot: Secondary | ICD-10-CM | POA: Diagnosis not present

## 2017-11-18 DIAGNOSIS — M7989 Other specified soft tissue disorders: Secondary | ICD-10-CM | POA: Diagnosis not present

## 2017-11-18 DIAGNOSIS — M79671 Pain in right foot: Secondary | ICD-10-CM | POA: Diagnosis not present

## 2017-11-26 DIAGNOSIS — M1611 Unilateral primary osteoarthritis, right hip: Secondary | ICD-10-CM | POA: Diagnosis not present

## 2017-12-03 DIAGNOSIS — M7751 Other enthesopathy of right foot: Secondary | ICD-10-CM | POA: Diagnosis not present

## 2017-12-03 DIAGNOSIS — M7989 Other specified soft tissue disorders: Secondary | ICD-10-CM | POA: Diagnosis not present

## 2017-12-06 ENCOUNTER — Ambulatory Visit: Payer: Self-pay | Admitting: Podiatry

## 2017-12-07 ENCOUNTER — Ambulatory Visit (INDEPENDENT_AMBULATORY_CARE_PROVIDER_SITE_OTHER): Payer: PPO

## 2017-12-07 ENCOUNTER — Telehealth: Payer: Self-pay

## 2017-12-07 VITALS — BP 152/58 | HR 84 | Temp 98.3°F | Ht 69.0 in | Wt 175.4 lb

## 2017-12-07 DIAGNOSIS — Z0000 Encounter for general adult medical examination without abnormal findings: Secondary | ICD-10-CM | POA: Diagnosis not present

## 2017-12-07 MED ORDER — LOSARTAN POTASSIUM 50 MG PO TABS: 50.0000 mg | ORAL_TABLET | Freq: Every day | ORAL | 3 refills | Status: DC

## 2017-12-07 NOTE — Progress Notes (Signed)
Subjective:   Rita Cruz is a 77 y.o. female who presents for Medicare Annual (Subsequent) preventive examination.  Review of Systems:  N/A   Cardiac Risk Factors include: advanced age (>70men, >67 women);dyslipidemia;hypertension     Objective:     Vitals: BP (!) 152/58 (BP Location: Right Arm)   Pulse 84   Temp 98.3 F (36.8 C) (Oral)   Ht 5\' 9"  (1.753 m)   Wt 175 lb 6.4 oz (79.6 kg)   BMI 25.90 kg/m   Body mass index is 25.9 kg/m.  Advanced Directives 12/07/2017 12/08/2016 12/08/2016 10/06/2016  Does Patient Have a Medical Advance Directive? Yes Yes Yes No  Type of Paramedic of [place];Living will - Living will;Healthcare Power of Attorney -  Copy of [place] in Chart? Yes - No - copy requested -  Would patient like information on creating a medical advance directive? - - - No - patient declined information    Tobacco Social History   Tobacco Use  Smoking Status Never Smoker  Smokeless Tobacco Never Used  Tobacco Comment   does not smoke     Counseling given: Not Answered Comment: does not smoke   Clinical Intake:  Pre-visit preparation completed: Yes  Pain : No/denies pain Pain Score: 0-No pain     Nutritional Status: BMI 25 -29 Overweight Nutritional Risks: None  How often do you need to have someone help you when you read instructions, pamphlets, or other written materials from your doctor or pharmacy?: 1 - Never     Information entered by :: [facility], LPN  Past Medical History:  Diagnosis Date  . Adenomatous polyps   . Anxiety   . Arthritis    neck  . Capsulitis of toe of right foot   . Chest pain    Adenosine Myoview in 2006 done for atypical chest pain, showed no eviden e for ischemia or infarct  . Colon polyps   . GERD (gastroesophageal reflux disease)   . History of colonic polyps   . HTN (hypertension)   . Hyperlipidemia   . PUD (peptic ulcer disease)    with hx of H.  Pylori   Past Surgical History:  Procedure Laterality Date  . ABDOMINAL HYSTERECTOMY    . BREAST BIOPSY Right 2011   benign mass done in Dr. [place] office  . BREAST BIOPSY Right 06/29/2015   top hat marker. PROLIFERATIVE FIBROCYSTIC CHANGE WITH ASSOCIATED   . CERVICAL DISCECTOMY    . CHOLECYSTECTOMY    . COLONOSCOPY WITH PROPOFOL N/A 10/06/2016   Procedure: COLONOSCOPY WITH PROPOFOL;  Surgeon: Lollie Sails, MD;  Location: [facility] ENDOSCOPY;  Service: Endoscopy;  Laterality: N/A;  . EYE SURGERY    . hysterectomy-unspecified area     Family History  Problem Relation Age of Onset  . Kidney failure Father   . Heart failure Father   . Diabetes Father   . COPD Mother   . Colon cancer Mother   . Heart attack Mother   . Ulcers Mother   . Osteoporosis Mother   . Macular degeneration Mother   . Hypertension Brother   . Kidney Stones Son   . Bipolar disorder Son   . Melanoma Son        stage IV  . Hypertension Son   . Kidney cancer Other    Social History   Socioeconomic History  . Marital status: Married    Spouse name: None  . Number of children: 2  .  Years of education: None  . Highest education level: Some college, no degree  Social Needs  . Financial resource strain: Not hard at all  . Food insecurity - worry: Never true  . Food insecurity - inability: Never true  . Transportation needs - medical: No  . Transportation needs - non-medical: No  Occupational History  . Occupation: retired  Tobacco Use  . Smoking status: Never Smoker  . Smokeless tobacco: Never Used  . Tobacco comment: does not smoke  Substance and Sexual Activity  . Alcohol use: Yes    Alcohol/week: 0.0 oz    Comment: rarely - wine  . Drug use: No  . Sexual activity: None  Other Topics Concern  . None  Social History Narrative   Married-2nd marriage. She has 2 children and her husband has 2 children.     Outpatient Encounter Medications as of 12/07/2017  Medication Sig  . ALPRAZolam  (XANAX) 0.5 MG tablet TAKE ONE TABLET BY MOUTH EVERY 12 HOURS AS NEEDED  . aspirin (ASPIRIN EC LO-DOSE) 81 MG EC tablet Take 81 mg by mouth daily.   Marland Kitchen diltiazem (DILACOR XR) 180 MG 24 hr capsule TAKE ONE CAPSULE BY MOUTH ONCE DAILY  . omeprazole (PRILOSEC) 20 MG capsule TAKE ONE CAPSULE BY MOUTH ONCE DAILY  . sertraline (ZOLOFT) 50 MG tablet TAKE ONE TABLET BY MOUTH ONCE DAILY  . [DISCONTINUED] azithromycin (ZITHROMAX) 250 MG tablet As directed  . [DISCONTINUED] meloxicam (MOBIC) 7.5 MG tablet Take 1 tablet (7.5 mg total) by mouth 2 (two) times daily as needed for pain.   No facility-administered encounter medications on file as of 12/07/2017.     Activities of Daily Living In your present state of health, do you have any difficulty performing the following activities: 12/07/2017 12/08/2016  Hearing? N N  Vision? N N  Difficulty concentrating or making decisions? N N  Walking or climbing stairs? Y Y  Comment Due to SOB. right leg pain  Dressing or bathing? N N  Doing errands, shopping? N N  Preparing Food and eating ? N N  Using the Toilet? N N  In the past six months, have you accidently leaked urine? Y Y  Comment At night time, pt wears depends. when coughs, wears protection  Do you have problems with loss of bowel control? N N  Managing your Medications? N N  Managing your Finances? N N  Housekeeping or managing your Housekeeping? N N  Some recent data might be hidden    Patient Care Team: Jerrol Banana., MD as PCP - General (Family Medicine) Birder Robson, MD as Referring Physician (Ophthalmology) Sharlet Salina, MD as Referring Physician (Physical Medicine and Rehabilitation)    Assessment:   This is a routine wellness examination for Rita Cruz.  Exercise Activities and Dietary recommendations Current Exercise Habits: The patient does not participate in regular exercise at present  Goals    . DIET - INCREASE WATER INTAKE     Recommend increasing water  intake 6-8 glasses of water a day.        Fall Risk Fall Risk  12/07/2017 12/08/2016 07/22/2015  Falls in the past year? No No No   Is the patient's home free of loose throw rugs in walkways, pet beds, electrical cords, etc?   yes      Grab bars in the bathroom? no      Handrails on the stairs?   yes      Adequate lighting?   yes  Timed  Get Up and Go performed: N/A  Depression Screen PHQ 2/9 Scores 12/07/2017 12/08/2016 07/22/2015  PHQ - 2 Score 0 1 0     Cognitive Function     6CIT Screen 12/07/2017 12/08/2016  What Year? 0 points 0 points  What month? 0 points 0 points  What time? 0 points 0 points  Count back from 20 0 points 0 points  Months in reverse 0 points 0 points  Repeat phrase 0 points 0 points  Total Score 0 0    Immunization History  Administered Date(s) Administered  . Influenza, High Dose Seasonal PF 08/25/2017  . Pneumococcal Conjugate-13 05/17/2014  . Pneumococcal Polysaccharide-23 04/11/2012  . Td 04/08/2010  . Tdap 04/11/2012  . Zoster 02/04/2009    Qualifies for Shingles Vaccine? Due for Shingles vaccine. Declined my offer to administer today. Education has been provided regarding the importance of this vaccine. Pt has been advised to call her insurance company to determine her out of pocket expense. Advised she may also receive this vaccine at her local pharmacy or Health Dept. Verbalized acceptance and understanding.  Screening Tests Health Maintenance  Topic Date Due  . COLONOSCOPY  10/07/2019  . TETANUS/TDAP  04/11/2022  . INFLUENZA VACCINE  Completed  . DEXA SCAN  Completed  . PNA vac Low Risk Adult  Completed    Cancer Screenings: Lung: Low Dose CT Chest recommended if Age 32-80 years, 30 pack-year currently smoking OR have quit w/in 15years. Patient does not qualify. Breast:  Up to date on Mammogram? Yes   Up to date of Bone Density/Dexa? Yes Colorectal: Up to date  Additional Screenings:  Hepatitis B/HIV/Syphillis: Pt declines today.   Hepatitis C Screening: Pt declines today.      Plan:  I have personally reviewed and addressed the Medicare Annual Wellness questionnaire and have noted the following in the patient's chart:  A. Medical and social history B. Use of alcohol, tobacco or illicit drugs  C. Current medications and supplements D. Functional ability and status E.  Nutritional status F.  Physical activity G. Advance directives H. List of other physicians I.  Hospitalizations, surgeries, and ER visits in previous 12 months J.  [place] such as hearing and vision if needed, cognitive and depression L. Referrals and appointments - none  In addition, I have reviewed and discussed with patient certain preventive protocols, quality metrics, and best practice recommendations. A written personalized care plan for preventive services as well as general preventive health recommendations were provided to patient.  See attached scanned questionnaire for additional information.   Signed,  Fabio Neighbors, LPN Nurse Health Advisor   Nurse Recommendations: None.

## 2017-12-07 NOTE — Telephone Encounter (Signed)
Patient was seen for Wellness visit this morning and b/p was elevated and it has been since stopping Lisinopril. Stopped Lisinopril due to the cough and cough is better. RX for Losartan sent in to [facility], [facility]

## 2017-12-07 NOTE — Patient Instructions (Signed)
Rita Cruz , Thank you for taking time to come for your Medicare Wellness Visit. I appreciate your ongoing commitment to your health goals. Please review the following plan we discussed and let me know if I can assist you in the future.   Screening recommendations/referrals: Colonoscopy: Up to date Mammogram: Up to date Bone Density: Up to date Recommended yearly ophthalmology/optometry visit for glaucoma screening and checkup Recommended yearly dental visit for hygiene and checkup  Vaccinations: Influenza vaccine: Up to date Pneumococcal vaccine: Up to date Tdap vaccine: Up to date Shingles vaccine: Pt declines today.      Advanced directives: .Already on file.   Conditions/risks identified: Recommend increasing water intake 6-8 glasses of water a day.   Next appointment: 01/24/18   Preventive Care 65 Years and Older, Female Preventive care refers to lifestyle choices and visits with your health care provider that can promote health and wellness. What does preventive care include?  A yearly physical exam. This is also called an annual well check.  Dental exams once or twice a year.  Routine eye exams. Ask your health care provider how often you should have your eyes checked.  Personal lifestyle choices, including:  Daily care of your teeth and gums.  Regular physical activity.  Eating a healthy diet.  Avoiding tobacco and drug use.  Limiting alcohol use.  Practicing safe sex.  Taking low-dose aspirin every day.  Taking vitamin and mineral supplements as recommended by your health care provider. What happens during an annual well check? The services and screenings done by your health care provider during your annual well check will depend on your age, overall health, lifestyle risk factors, and family history of disease. Counseling  Your health care provider may ask you questions about your:  Alcohol use.  Tobacco use.  Drug use.  Emotional  well-being.  Home and relationship well-being.  Sexual activity.  Eating habits.  History of falls.  Memory and ability to understand (cognition).  Work and work Statistician.  Reproductive health. Screening  You may have the following tests or measurements:  Height, weight, and BMI.  Blood pressure.  Lipid and cholesterol levels. These may be checked every 5 years, or more frequently if you are over 84 years old.  Skin check.  Lung cancer screening. You may have this screening every year starting at age 53 if you have a 30-pack-year history of smoking and currently smoke or have quit within the past 15 years.  Fecal occult blood test (FOBT) of the stool. You may have this test every year starting at age 37.  Flexible sigmoidoscopy or colonoscopy. You may have a sigmoidoscopy every 5 years or a colonoscopy every 10 years starting at age 27.  Hepatitis C blood test.  Hepatitis B blood test.  Sexually transmitted disease (STD) testing.  Diabetes screening. This is done by checking your blood sugar (glucose) after you have not eaten for a while (fasting). You may have this done every 1-3 years.  Bone density scan. This is done to screen for osteoporosis. You may have this done starting at age 8.  Mammogram. This may be done every 1-2 years. Talk to your health care provider about how often you should have regular mammograms. Talk with your health care provider about your test results, treatment options, and if necessary, the need for more tests. Vaccines  Your health care provider may recommend certain vaccines, such as:  Influenza vaccine. This is recommended every year.  Tetanus, diphtheria, and acellular pertussis (  Tdap, Td) vaccine. You may need a Td booster every 10 years.  Zoster vaccine. You may need this after age 13.  Pneumococcal 13-valent conjugate (PCV13) vaccine. One dose is recommended after age 62.  Pneumococcal polysaccharide (PPSV23) vaccine. One  dose is recommended after age 76. Talk to your health care provider about which screenings and vaccines you need and how often you need them. This information is not intended to replace advice given to you by your health care provider. Make sure you discuss any questions you have with your health care provider. Document Released: 12/06/2015 Document Revised: 07/29/2016 Document Reviewed: 09/10/2015 Elsevier Interactive Patient Education  2017 [place] Prevention in the Home Falls can cause injuries. They can happen to people of all ages. There are many things you can do to make your home safe and to help prevent falls. What can I do on the outside of my home?  Regularly fix the edges of walkways and driveways and fix any cracks.  Remove anything that might make you trip as you walk through a door, such as a raised step or threshold.  Trim any bushes or trees on the path to your home.  Use bright outdoor lighting.  Clear any walking paths of anything that might make someone trip, such as rocks or tools.  Regularly check to see if handrails are loose or broken. Make sure that both sides of any steps have handrails.  Any raised decks and porches should have guardrails on the edges.  Have any leaves, snow, or ice cleared regularly.  Use sand or salt on walking paths during winter.  Clean up any spills in your garage right away. This includes oil or grease spills. What can I do in the bathroom?  Use night lights.  Install grab bars by the toilet and in the tub and shower. Do not use towel bars as grab bars.  Use non-skid mats or decals in the tub or shower.  If you need to sit down in the shower, use a plastic, non-slip stool.  Keep the floor dry. Clean up any water that spills on the floor as soon as it happens.  Remove soap buildup in the tub or shower regularly.  Attach bath mats securely with double-sided non-slip rug tape.  Do not have throw rugs and other  things on the floor that can make you trip. What can I do in the bedroom?  Use night lights.  Make sure that you have a light by your bed that is easy to reach.  Do not use any sheets or blankets that are too big for your bed. They should not hang down onto the floor.  Have a firm chair that has side arms. You can use this for support while you get dressed.  Do not have throw rugs and other things on the floor that can make you trip. What can I do in the kitchen?  Clean up any spills right away.  Avoid walking on wet floors.  Keep items that you use a lot in easy-to-reach places.  If you need to reach something above you, use a strong step stool that has a grab bar.  Keep electrical cords out of the way.  Do not use floor polish or wax that makes floors slippery. If you must use wax, use non-skid floor wax.  Do not have throw rugs and other things on the floor that can make you trip. What can I do with my stairs?  Do  not leave any items on the stairs.  Make sure that there are handrails on both sides of the stairs and use them. Fix handrails that are broken or loose. Make sure that handrails are as long as the stairways.  Check any carpeting to make sure that it is firmly attached to the stairs. Fix any carpet that is loose or worn.  Avoid having throw rugs at the top or bottom of the stairs. If you do have throw rugs, attach them to the floor with carpet tape.  Make sure that you have a light switch at the top of the stairs and the bottom of the stairs. If you do not have them, ask someone to add them for you. What else can I do to help prevent falls?  Wear shoes that:  Do not have high heels.  Have rubber bottoms.  Are comfortable and fit you well.  Are closed at the toe. Do not wear sandals.  If you use a stepladder:  Make sure that it is fully opened. Do not climb a closed stepladder.  Make sure that both sides of the stepladder are locked into place.  Ask  someone to hold it for you, if possible.  Clearly mark and make sure that you can see:  Any grab bars or handrails.  First and last steps.  Where the edge of each step is.  Use tools that help you move around (mobility aids) if they are needed. These include:  Canes.  Walkers.  Scooters.  Crutches.  Turn on the lights when you go into a dark area. Replace any light bulbs as soon as they burn out.  Set up your furniture so you have a clear path. Avoid moving your furniture around.  If any of your floors are uneven, fix them.  If there are any pets around you, be aware of where they are.  Review your medicines with your doctor. Some medicines can make you feel dizzy. This can increase your chance of falling. Ask your doctor what other things that you can do to help prevent falls. This information is not intended to replace advice given to you by your health care provider. Make sure you discuss any questions you have with your health care provider. Document Released: 09/05/2009 Document Revised: 04/16/2016 Document Reviewed: 12/14/2014 Elsevier Interactive Patient Education  2017 [employer].

## 2017-12-27 ENCOUNTER — Ambulatory Visit (INDEPENDENT_AMBULATORY_CARE_PROVIDER_SITE_OTHER): Payer: PPO | Admitting: Family Medicine

## 2017-12-27 VITALS — BP 148/80 | HR 78 | Temp 98.5°F | Resp 16 | Wt 177.0 lb

## 2017-12-27 DIAGNOSIS — R300 Dysuria: Secondary | ICD-10-CM | POA: Diagnosis not present

## 2017-12-27 LAB — POCT URINALYSIS DIPSTICK
Bilirubin, UA: NEGATIVE
Blood, UA: NEGATIVE
Glucose, UA: NEGATIVE
Ketones, UA: NEGATIVE
Leukocytes, UA: NEGATIVE
Nitrite, UA: NEGATIVE
Protein, UA: NEGATIVE
Spec Grav, UA: 1.01 (ref 1.010–1.025)
Urobilinogen, UA: 0.2 E.U./dL
pH, UA: 5 (ref 5.0–8.0)

## 2017-12-27 NOTE — Progress Notes (Addendum)
Patient: Rita Cruz, Female    DOB: Nov 14, 1941, 77 y.o.   MRN: [id number] Visit Date: 12/27/2017  Today's Provider: Wilhemena Durie, MD   Chief Complaint  Patient presents with  . Annual Exam   Subjective:  Rita Cruz is a 77 y.o. female who presents today for health maintenance and complete physical. She feels fairly well. She reports exercising none. She reports she is sleeping well.  Immunization History  Administered Date(s) Administered  . Influenza, High Dose Seasonal PF 08/25/2017  . Pneumococcal Conjugate-13 05/17/2014  . Pneumococcal Polysaccharide-23 04/11/2012  . Td 04/08/2010  . Tdap 04/11/2012  . Zoster 02/04/2009   10/06/16 Colonoscopy, [place]-Multiple Tubular adenoma, all negative for high grade dysplasia 03/18/17 screening mammogram with follow up unilateral diagnostic on 03/30/17 and diagnostic breast tomogram unilateral- Bira d 3. Probable benign, f/u 1 yr.   Review of Systems  Constitutional: Positive for diaphoresis and fatigue.  HENT: Positive for postnasal drip and rhinorrhea.   Eyes: Positive for photophobia.  Respiratory: Positive for shortness of breath.   Cardiovascular: Positive for palpitations.  Gastrointestinal: Negative.   Endocrine: Positive for heat intolerance.  Genitourinary: Positive for enuresis and urgency.  Musculoskeletal: Positive for arthralgias, back pain, myalgias, neck pain and neck stiffness.  Skin: Negative.   Allergic/Immunologic: Negative.   Neurological: Positive for headaches.  Hematological: Bruises/bleeds easily.  Psychiatric/Behavioral: Negative.     Social History   Socioeconomic History  . Marital status: Married    Spouse name: Not on file  . Number of children: 2  . Years of education: Not on file  . Highest education level: Some college, no degree  Social Needs  . Financial resource strain: Not hard at all  . Food insecurity - worry: Never true  . Food insecurity - inability: Never true   . Transportation needs - medical: No  . Transportation needs - non-medical: No  Occupational History  . Occupation: retired  Tobacco Use  . Smoking status: Never Smoker  . Smokeless tobacco: Never Used  . Tobacco comment: does not smoke  Substance and Sexual Activity  . Alcohol use: Yes    Alcohol/week: 0.0 oz    Comment: rarely - wine  . Drug use: No  . Sexual activity: Not on file  Other Topics Concern  . Not on file  Social History Narrative   Married-2nd marriage. She has 2 children and her husband has 2 children.     Patient Active Problem List   Diagnosis Date Noted  . Allergic rhinitis 05/23/2015  . Arthropathy, lower leg 05/23/2015  . Benign neoplasm of colon 05/23/2015  . Clinical depression 05/23/2015  . Essential (primary) hypertension 05/23/2015  . Acid reflux 05/23/2015  . Anxiety, generalized 05/23/2015  . Hypercholesteremia 05/23/2015  . Blood glucose elevated 05/23/2015  . Symptomatic menopausal or female climacteric states 05/23/2015  . Arthritis, degenerative 05/23/2015  . Mixed incontinence 03/24/2013    Past Surgical History:  Procedure Laterality Date  . ABDOMINAL HYSTERECTOMY    . BREAST BIOPSY Right 2011   benign mass done in Dr. Curly [place] office  . BREAST BIOPSY Right 06/29/2015   top hat marker. PROLIFERATIVE FIBROCYSTIC CHANGE WITH ASSOCIATED   . CERVICAL DISCECTOMY    . CHOLECYSTECTOMY    . COLONOSCOPY WITH PROPOFOL N/A 10/06/2016   Procedure: COLONOSCOPY WITH PROPOFOL;  Surgeon: Lollie Sails, MD;  Location: [facility] ENDOSCOPY;  Service: Endoscopy;  Laterality: N/A;  . EYE SURGERY    . hysterectomy-unspecified area      Her  family history includes Bipolar disorder in her son; COPD in her mother; Colon cancer in her mother; Diabetes in her father; Heart attack in her mother; Heart failure in her father; Hypertension in her brother and son; Kidney Stones in her son; Kidney cancer in her other; Kidney failure in her father; Macular  degeneration in her mother; Melanoma in her son; Osteoporosis in her mother; Ulcers in her mother.     Outpatient Encounter Medications as of 12/27/2017  Medication Sig Note  . ALPRAZolam (XANAX) 0.5 MG tablet TAKE ONE TABLET BY MOUTH EVERY 12 HOURS AS NEEDED   . aspirin (ASPIRIN EC LO-DOSE) 81 MG EC tablet Take 81 mg by mouth daily.  05/23/2015: Received from: [facility]  . diltiazem (DILACOR XR) 180 MG 24 hr capsule TAKE ONE CAPSULE BY MOUTH ONCE DAILY   . losartan (COZAAR) 50 MG tablet Take 1 tablet (50 mg total) by mouth daily.   Marland Kitchen omeprazole (PRILOSEC) 20 MG capsule TAKE ONE CAPSULE BY MOUTH ONCE DAILY   . sertraline (ZOLOFT) 50 MG tablet TAKE ONE TABLET BY MOUTH ONCE DAILY    No facility-administered encounter medications on file as of 12/27/2017.     Patient Care Team: Jerrol Banana., MD as PCP - General (Family Medicine) Birder Robson, MD as Referring Physician (Ophthalmology) Sharlet Salina, MD as Referring Physician (Physical Medicine and Rehabilitation)      Objective:   Vitals:  Vitals:   12/27/17 1426  BP: (!) 148/80  Pulse: 78  Resp: 16  Temp: 98.5 F (36.9 C)  TempSrc: Oral  Weight: 177 lb (80.3 kg)    Physical Exam  Constitutional: She is oriented to person, place, and time. She appears well-developed and well-nourished.  HENT:  Head: Normocephalic and atraumatic.  Right Ear: External ear normal.  Left Ear: External ear normal.  Nose: Nose normal.  Mouth/Throat: Oropharynx is clear and moist.  Eyes: Conjunctivae and EOM are normal. Pupils are equal, round, and reactive to light.  Neck: Normal range of motion. Neck supple.  Cardiovascular: Normal rate, regular rhythm, normal heart sounds and intact distal pulses.  Pulmonary/Chest: Effort normal and breath sounds normal.  Abdominal: Soft. Bowel sounds are normal.  Musculoskeletal: Normal range of motion.  Neurological: She is alert and oriented to person, place, and time.   Skin: Skin is warm and dry.  Psychiatric: She has a normal mood and affect. Her behavior is normal. Judgment and thought content normal.   Home Exercise  12/07/2017 12/08/2016  Current Exercise Habits The patient does not participate in regular exercise at present Home exercise routine  Type of exercise - walking  Time (Minutes) - 20  Frequency (Times/Week) - 3  Weekly Exercise (Minutes/Week) - 60  Intensity - Mild  Exercise limited by: (No Data) -   Fall Risk  12/07/2017 12/08/2016 07/22/2015  Falls in the past year? No No No    Depression Screen PHQ 2/9 Scores 12/07/2017 12/08/2016 07/22/2015  PHQ - 2 Score 0 1 0      Assessment & Plan:     Routine Health Maintenance and Physical Exam  Exercise Activities and Dietary recommendations Goals    . DIET - INCREASE WATER INTAKE     Recommend increasing water intake 6-8 glasses of water a day.        Immunization History  Administered Date(s) Administered  . Influenza, High Dose Seasonal PF 08/25/2017  . Pneumococcal Conjugate-13 05/17/2014  . Pneumococcal Polysaccharide-23 04/11/2012  . Td 04/08/2010  . Tdap  04/11/2012  . Zoster 02/04/2009    Health Maintenance  Topic Date Due  . COLONOSCOPY  10/07/2019  . TETANUS/TDAP  04/11/2022  . INFLUENZA VACCINE  Completed  . DEXA SCAN  Completed  . PNA vac Low Risk Adult  Completed   Dry Macular Degeneration Tubular adenoma--repeat 2020. Discussed health benefits of physical activity, and encouraged her to engage in regular exercise appropriate for her age and condition.   I have done the exam and reviewed the chart and it is accurate to the best of my knowledge. Development worker, community has been used and  any errors in dictation or transcription are unintentional. Miguel Aschoff M.D. [facility]

## 2017-12-28 ENCOUNTER — Other Ambulatory Visit: Payer: Self-pay | Admitting: Family Medicine

## 2018-01-19 ENCOUNTER — Encounter: Payer: Self-pay | Admitting: *Deleted

## 2018-01-24 ENCOUNTER — Ambulatory Visit: Payer: PPO | Admitting: Family Medicine

## 2018-02-02 ENCOUNTER — Ambulatory Visit (INDEPENDENT_AMBULATORY_CARE_PROVIDER_SITE_OTHER): Payer: PPO | Admitting: Family Medicine

## 2018-02-02 ENCOUNTER — Encounter: Payer: Self-pay | Admitting: Family Medicine

## 2018-02-02 VITALS — BP 110/60 | HR 82 | Temp 98.6°F | Resp 16

## 2018-02-02 DIAGNOSIS — R197 Diarrhea, unspecified: Secondary | ICD-10-CM

## 2018-02-02 DIAGNOSIS — J40 Bronchitis, not specified as acute or chronic: Secondary | ICD-10-CM | POA: Diagnosis not present

## 2018-02-02 MED ORDER — DOXYCYCLINE HYCLATE 100 MG PO TABS: 100.0000 mg | ORAL_TABLET | Freq: Two times a day (BID) | ORAL | 0 refills | Status: AC

## 2018-02-02 NOTE — Patient Instructions (Signed)
Start taking OTC probiotics every day  Use an albuterol inhaler every 4-6 hours  Call if not much better within a week  You can take OTC Mucinex for chest congestion

## 2018-02-02 NOTE — Progress Notes (Signed)
Patient: Rita Cruz Female    DOB: 08-23-1941   77 y.o.   MRN: [id number] Visit Date: 02/02/2018  Today's Provider: Lelon Huh, MD   Chief Complaint  Patient presents with  . Cough   Subjective:    Patient has had cough and congestion for about 5 days. Cough is productive. Patient stated she began having diarrhea yesterday. Other symptoms include: headache, nasal congestion, scratchy throat, and wheezing. Patient has been taking mucinex and tylenol.     Cough  This is a new problem. The current episode started in the past 7 days (5 days). The problem has been gradually worsening. The cough is productive of sputum. Associated symptoms include headaches, nasal congestion, a sore throat and wheezing. Pertinent negatives include no chest pain, chills, ear congestion, ear pain, fever, heartburn, hemoptysis, myalgias, postnasal drip, rash, rhinorrhea, shortness of breath, sweats or weight loss.  Her throat was scratchy for a few days but better today. Highest temperature was about 100.5. Has taken Mucinex DM, but no tylenol, ibuprofen or other fever reducing medications today.  Has headache a few days which was severe, but much better now.    Allergies  Allergen Reactions  . Percocet  [Oxycodone-Acetaminophen] Other (See Comments)    Hallucinations Hallucinations  . Levaquin [Levofloxacin In D5w] Diarrhea    Vaginal itching  . Lisinopril     cough  . Meloxicam     Upset stomach  . Penicillins Itching  . Preservision Areds 2 [Multiple Vitamins-Minerals] Nausea Only     Current Outpatient Medications:  .  ALPRAZolam (XANAX) 0.5 MG tablet, TAKE ONE TABLET BY MOUTH EVERY 12 HOURS AS NEEDED, Disp: 55 tablet, Rfl: 3 .  aspirin (ASPIRIN EC LO-DOSE) 81 MG EC tablet, Take 81 mg by mouth daily. , Disp: , Rfl:  .  DILT-XR 180 MG 24 hr capsule, TAKE ONE CAPSULE BY MOUTH ONCE DAILY, Disp: 90 capsule, Rfl: 3 .  diltiazem (DILACOR XR) 180 MG 24 hr capsule, TAKE ONE CAPSULE  BY MOUTH ONCE DAILY, Disp: 30 capsule, Rfl: 11 .  losartan (COZAAR) 50 MG tablet, Take 1 tablet (50 mg total) by mouth daily., Disp: 90 tablet, Rfl: 3 .  omeprazole (PRILOSEC) 20 MG capsule, TAKE ONE CAPSULE BY MOUTH ONCE DAILY, Disp: 90 capsule, Rfl: 3 .  sertraline (ZOLOFT) 50 MG tablet, TAKE ONE TABLET BY MOUTH ONCE DAILY, Disp: 90 tablet, Rfl: 3  Review of Systems  Constitutional: Negative for appetite change, chills, fatigue, fever and weight loss.  HENT: Positive for congestion and sore throat. Negative for ear pain, postnasal drip and rhinorrhea.   Respiratory: Positive for cough and wheezing. Negative for hemoptysis, chest tightness and shortness of breath.   Cardiovascular: Negative for chest pain and palpitations.  Gastrointestinal: Positive for diarrhea. Negative for abdominal pain, heartburn, nausea and vomiting.  Musculoskeletal: Negative for myalgias.  Skin: Negative for rash.  Neurological: Positive for headaches. Negative for dizziness and weakness.    Social History   Tobacco Use  . Smoking status: Never Smoker  . Smokeless tobacco: Never Used  . Tobacco comment: does not smoke  Substance Use Topics  . Alcohol use: Yes    Alcohol/week: 0.0 oz    Comment: rarely - wine   Objective:   BP 110/60 (BP Location: Right Arm, Patient Position: Sitting, Cuff Size: Large)   Pulse 82   Temp 98.6 F (37 C) (Oral)   Resp 16   SpO2 94%     Physical Exam  General Appearance:    Alert, cooperative, no distress  HENT:   ENT exam normal, no neck nodes or sinus tenderness, neck without nodes, sinuses nontender and nasal mucosa congested  Eyes:    PERRL, conjunctiva/corneas clear, EOM's intact       Lungs:     Extensive expiratory wheezing, respirations unlabored, no rales.   Heart:    Regular rate and rhythm  Neurologic:   Awake, alert, oriented x 3. No apparent focal neurological           defect.           Assessment & Plan:     1. Bronchitis She does not tolerate  levofloxacin or penicillins.  - doxycycline (VIBRA-TABS) 100 MG tablet; Take 1 tablet (100 mg total) by mouth 2 (two) times daily for 7 days.  Dispense: 14 tablet; Refill: 0  She has an albuterol inhaler at home which she can use. Continue Mucinex. Call if symptoms change or if not rapidly improving.     2. Diarrhea, unspecified type Recommend she use OTC probiotics until resolved.   Call if symptoms change or if not rapidly improving.           Lelon Huh, MD  [facility]

## 2018-02-03 ENCOUNTER — Encounter: Payer: Self-pay | Admitting: General Surgery

## 2018-02-03 ENCOUNTER — Ambulatory Visit
Admission: RE | Admit: 2018-02-03 | Discharge: 2018-02-03 | Disposition: A | Discharge status: Home / Self Care | Payer: PPO | Source: Ambulatory Visit | Attending: General Surgery | Admitting: General Surgery

## 2018-02-03 ENCOUNTER — Ambulatory Visit (INDEPENDENT_AMBULATORY_CARE_PROVIDER_SITE_OTHER): Payer: PPO | Admitting: General Surgery

## 2018-02-03 ENCOUNTER — Other Ambulatory Visit: Payer: Self-pay

## 2018-02-03 VITALS — BP 136/74 | HR 113 | Resp 16 | Ht 69.0 in | Wt 174.0 lb

## 2018-02-03 DIAGNOSIS — R920 Mammographic microcalcification found on diagnostic imaging of breast: Secondary | ICD-10-CM

## 2018-02-03 DIAGNOSIS — R059 Cough, unspecified: Secondary | ICD-10-CM

## 2018-02-03 DIAGNOSIS — R0989 Other specified symptoms and signs involving the circulatory and respiratory systems: Secondary | ICD-10-CM | POA: Insufficient documentation

## 2018-02-03 DIAGNOSIS — R05 Cough: Secondary | ICD-10-CM

## 2018-02-03 DIAGNOSIS — J40 Bronchitis, not specified as acute or chronic: Secondary | ICD-10-CM | POA: Diagnosis not present

## 2018-02-03 MED ORDER — AZITHROMYCIN 250 MG PO TABS: ORAL_TABLET | ORAL | 0 refills | Status: DC

## 2018-02-03 NOTE — Progress Notes (Signed)
Patient ID: Rita Cruz, female   DOB: 03/30/1941, 77 y.o.   MRN: [id number]  Chief Complaint  Patient presents with  . Breast Problem    HPI Rita Cruz is a 77 y.o. female.  who presents for a breast evaluation. The most recent mammogram was done on 09-30-17. She states they recommended a 3 month follow up. She admits to tenderness underneath both breast she thinks form her bra. She thinks the right breast feels fuller. Patient does perform regular self breast checks and gets regular mammograms done.   She admits to bronchitis that started Sunday and is on doxycycline since yesterday. She states the antibiotic is upsetting her stomach. She is here with her husband, [name] of 43 years and daughter, Suanne Marker.  HPI  Past Medical History:  Diagnosis Date  . Adenomatous polyps   . Anxiety   . Arthritis    neck  . Capsulitis of toe of right foot   . Chest pain    Adenosine Myoview in 2006 done for atypical chest pain, showed no eviden e for ischemia or infarct  . Colon polyps   . GERD (gastroesophageal reflux disease)   . History of colonic polyps   . HTN (hypertension)   . Hyperlipidemia   . PUD (peptic ulcer disease)    with hx of H. Pylori    Past Surgical History:  Procedure Laterality Date  . ABDOMINAL HYSTERECTOMY    . BREAST BIOPSY Right 2011   benign mass done in Dr. Curly [place] office  . BREAST BIOPSY Right 06/29/2015   top hat marker. PROLIFERATIVE FIBROCYSTIC CHANGE WITH ASSOCIATED   . CERVICAL DISCECTOMY    . CHOLECYSTECTOMY    . COLONOSCOPY WITH PROPOFOL N/A 10/06/2016   Procedure: COLONOSCOPY WITH PROPOFOL;  Surgeon: Lollie Sails, MD;  Location: [facility] ENDOSCOPY;  Service: Endoscopy;  Laterality: N/A;  . EYE SURGERY    . hysterectomy-unspecified area      Family History  Problem Relation Age of Onset  . Kidney failure Father   . Heart failure Father   . Diabetes Father   . COPD Mother   . Colon cancer Mother   . Heart attack Mother   .  Ulcers Mother   . Osteoporosis Mother   . Macular degeneration Mother   . Hypertension Brother   . Kidney Stones Son   . Bipolar disorder Son   . Melanoma Son        stage IV  . Hypertension Son   . Kidney cancer Other     Social History Social History   Tobacco Use  . Smoking status: Never Smoker  . Smokeless tobacco: Never Used  . Tobacco comment: does not smoke  Substance Use Topics  . Alcohol use: Yes    Alcohol/week: 0.0 oz    Comment: rarely - wine  . Drug use: No    Allergies  Allergen Reactions  . Percocet  [Oxycodone-Acetaminophen] Other (See Comments)    Hallucinations Hallucinations  . Levaquin [Levofloxacin In D5w] Diarrhea    Vaginal itching  . Lisinopril     cough  . Meloxicam     Upset stomach  . Penicillins Itching  . Preservision Areds 2 [Multiple Vitamins-Minerals] Nausea Only    Current Outpatient Medications  Medication Sig Dispense Refill  . ALPRAZolam (XANAX) 0.5 MG tablet TAKE ONE TABLET BY MOUTH EVERY 12 HOURS AS NEEDED 55 tablet 3  . aspirin (ASPIRIN EC LO-DOSE) 81 MG EC tablet Take 81 mg by mouth daily.     Marland Kitchen  DILT-XR 180 MG 24 hr capsule TAKE ONE CAPSULE BY MOUTH ONCE DAILY 90 capsule 3  . diltiazem (DILACOR XR) 180 MG 24 hr capsule TAKE ONE CAPSULE BY MOUTH ONCE DAILY 30 capsule 11  . doxycycline (VIBRA-TABS) 100 MG tablet Take 1 tablet (100 mg total) by mouth 2 (two) times daily for 7 days. 14 tablet 0  . losartan (COZAAR) 50 MG tablet Take 1 tablet (50 mg total) by mouth daily. 90 tablet 3  . omeprazole (PRILOSEC) 20 MG capsule TAKE ONE CAPSULE BY MOUTH ONCE DAILY 90 capsule 3  . sertraline (ZOLOFT) 50 MG tablet TAKE ONE TABLET BY MOUTH ONCE DAILY 90 tablet 3  . azithromycin (ZITHROMAX) 250 MG tablet Take two tablets for one day, then one everyday until gone. 6 tablet 0   No current facility-administered medications for this visit.     Review of Systems Review of Systems  Constitutional: Negative.   Respiratory: Positive for  cough.   Cardiovascular: Negative.     Blood pressure 136/74, pulse (!) 113, resp. rate 16, height 5\' 9"  (1.753 m), weight 174 lb (78.9 kg), SpO2 96 %.  Physical Exam Physical Exam  Constitutional: She is oriented to person, place, and time. She appears well-developed and well-nourished.  HENT:  Mouth/Throat: Oropharynx is clear and moist.  Eyes: Conjunctivae are normal. No scleral icterus.  Neck: Neck supple.  Cardiovascular: Normal rate, regular rhythm and normal heart sounds.  Pulmonary/Chest: She has rhonchi in the right upper field and the left upper field. Right breast exhibits no inverted nipple, no mass, no nipple discharge, no skin change and no tenderness. Left breast exhibits no inverted nipple, no mass, no nipple discharge, no skin change and no tenderness.  Expiratory rhonchi are noted throughout all lung fields except for the right lower lobe.  No inspiratory wheezing.  Lymphadenopathy:    She has no cervical adenopathy.    She has no axillary adenopathy.  Neurological: She is alert and oriented to person, place, and time.  Skin: Skin is warm and dry.  Psychiatric: Her behavior is normal.    Data Reviewed Chest x-ray completed after the patient's visit showed no acute cardiopulmonary disease.  Assessment    Nausea from oral doxycycline.  Benign breast exam.    Plan         Discussed with Dr Rosanna Randy, plan for changing antibiotics to Zithromax.  Will obtain a chest x-ray today.   The patient has been asked to have bilateral diagnostic mammogram and call the office when she has the mammogram done for results.    HPI, Physical Exam, Assessment and Plan have been scribed under the direction and in the presence of Robert Bellow, MD. Karie Fetch, RN  I have completed the exam and reviewed the above documentation for accuracy and completeness.  I agree with the above.  Haematologist has been used and any errors in dictation or transcription are  unintentional.  Hervey Ard, M.D., F.A.C.S. [facility] Parveen Freehling 02/04/2018, 7:33 PM  Patient has been sent to [place] to have a CXR PA and lateral done today.   The patient has also been scheduled for a bilateral diagnostic mammogram at the [facility] on 03-21-18 at 1:40 pm. The patient is aware of date, time, and instructions. She has been asked to call the office day of exam for results. No office visit follow up will be required per Dr. Bary Castilla.   Dominga Ferry, CMA

## 2018-02-03 NOTE — Patient Instructions (Addendum)
The patient is aware to call back for any questions or concerns.  Recommend Chest Xray Stop antibiotic doxycycline  Start new Zithromax antibiotic  The patient has been asked to have bilateral diagnostic mammogram and call the office when she has the mammogram done for results.

## 2018-02-04 ENCOUNTER — Telehealth: Payer: Self-pay

## 2018-02-04 DIAGNOSIS — J40 Bronchitis, not specified as acute or chronic: Secondary | ICD-10-CM | POA: Insufficient documentation

## 2018-02-04 DIAGNOSIS — R05 Cough: Secondary | ICD-10-CM | POA: Insufficient documentation

## 2018-02-04 DIAGNOSIS — R059 Cough, unspecified: Secondary | ICD-10-CM | POA: Insufficient documentation

## 2018-02-04 DIAGNOSIS — R920 Mammographic microcalcification found on diagnostic imaging of breast: Secondary | ICD-10-CM | POA: Insufficient documentation

## 2018-02-04 HISTORY — DX: Bronchitis, not specified as acute or chronic: J40

## 2018-02-04 NOTE — Telephone Encounter (Signed)
Notified patient as instructed, patient pleased. Will finish her antibiotics.

## 2018-02-04 NOTE — Telephone Encounter (Signed)
-----   Message from Robert Bellow, MD sent at 02/03/2018  8:40 PM EDT ----- Please notify the patient her CXR was OK. Complete antibiotic as sent prescribed by Dr. Rosanna Randy. Thanks.

## 2018-02-08 ENCOUNTER — Other Ambulatory Visit: Payer: Self-pay | Admitting: Physician Assistant

## 2018-02-08 DIAGNOSIS — F418 Other specified anxiety disorders: Secondary | ICD-10-CM

## 2018-02-10 ENCOUNTER — Telehealth: Payer: Self-pay | Admitting: Family Medicine

## 2018-02-10 ENCOUNTER — Other Ambulatory Visit: Payer: Self-pay | Admitting: Physician Assistant

## 2018-02-10 DIAGNOSIS — F418 Other specified anxiety disorders: Secondary | ICD-10-CM

## 2018-02-10 NOTE — Telephone Encounter (Signed)
Patient states that Rita Cruz says that they did not get this when you sent it on 02/08/2018

## 2018-02-10 NOTE — Telephone Encounter (Signed)
Patient is requesting a refill for a zpack.  She states that she is still really congested in her head, ears are stopped up and some cough.  She uses Paediatric nurse on [address].

## 2018-02-10 NOTE — Telephone Encounter (Signed)
Not a good idea. Most are viral URI.

## 2018-02-10 NOTE — Telephone Encounter (Signed)
LMTCB

## 2018-02-11 NOTE — Telephone Encounter (Signed)
Dr. Caryn Section, I don't think Dr Rosanna Randy realized you had just seen her for this.  Could you please review and let me know what you think. Thanks [employer]

## 2018-02-11 NOTE — Telephone Encounter (Signed)
She was prescribed  doxycycline bronchitis and then changed to zpack on 3/14 for bronchitis. Is she better, worse, same? Is this the same problem or something new. Need more info.Marland Kitchen

## 2018-02-11 NOTE — Telephone Encounter (Signed)
Please send again.

## 2018-02-11 NOTE — Telephone Encounter (Signed)
Spoke to husband as wife was sleeping.  He states she is getting better but still coughing. Not really having other symptoms at this point. She had x-ray by Dr Bary Castilla when she was in seeing him and it was negative.  She is going to give it over the weekend and let us know if she does not continue to improve or worsen.  Thanks, ED

## 2018-03-14 ENCOUNTER — Encounter: Payer: Self-pay | Admitting: General Surgery

## 2018-03-15 ENCOUNTER — Encounter: Payer: Self-pay | Admitting: General Surgery

## 2018-03-16 ENCOUNTER — Telehealth: Payer: Self-pay

## 2018-03-16 NOTE — Telephone Encounter (Signed)
Patient called office requesting a call back from [place], patient states that yesterday afternoon her husband had pulled off a tick found on her right underarm with tweezers. Patient reports area is red and raised and she states that she has a old prescription for doxycyline and wants to know if Jiles Garter would advise her to take it. Patient states that she has a wedding to go to by the weekend, she has declined appt for now until she hears back from [place]. KW

## 2018-03-16 NOTE — Telephone Encounter (Signed)
Patient's description does not sound like there is any infection.  She will watch the site and if it starts looking infected she is to make an appointment to be seen.

## 2018-03-16 NOTE — Telephone Encounter (Signed)
LMTCB ED 

## 2018-03-16 NOTE — Telephone Encounter (Signed)
Pt returning call

## 2018-03-21 ENCOUNTER — Other Ambulatory Visit: Payer: PPO

## 2018-03-24 ENCOUNTER — Ambulatory Visit (INDEPENDENT_AMBULATORY_CARE_PROVIDER_SITE_OTHER): Payer: PPO | Admitting: Family Medicine

## 2018-03-24 ENCOUNTER — Encounter: Payer: Self-pay | Admitting: Family Medicine

## 2018-03-24 VITALS — BP 144/86 | HR 75 | Temp 98.4°F | Resp 16

## 2018-03-24 DIAGNOSIS — M62838 Other muscle spasm: Secondary | ICD-10-CM

## 2018-03-24 DIAGNOSIS — G44209 Tension-type headache, unspecified, not intractable: Secondary | ICD-10-CM | POA: Diagnosis not present

## 2018-03-24 MED ORDER — BACLOFEN 10 MG PO TABS: 10.0000 mg | ORAL_TABLET | Freq: Three times a day (TID) | ORAL | 0 refills | Status: DC | PRN

## 2018-03-24 NOTE — Patient Instructions (Signed)

## 2018-03-24 NOTE — Progress Notes (Signed)
Patient: Rita Cruz Female    DOB: 08-May-1941   77 y.o.   MRN: [id number] Visit Date: 03/24/2018  Today's Provider: [name], MD   Chief Complaint  Patient presents with  . Headache   Subjective:    Headache   This is a new problem. Episode onset: couple of weeks since granddaughter's wedding. The problem occurs intermittently. Pain location: starts in neck bilaterally. The pain is mild. Associated symptoms include facial sweating (last night), muscle aches, nausea, neck pain, tinnitus and vomiting. Pertinent negatives include no blurred vision, dizziness, ear pain, hearing loss, loss of balance, numbness, phonophobia, photophobia, rhinorrhea, seizures, sinus pressure, visual change or weakness. Treatments tried: massage therapy, excedrin PM. The treatment provided moderate relief. There is no history of migraine headaches.   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 Also has h/o sinus pressure with headaches.  History also includes cervical discectomy several years ago with hardware in place.  She has had difficulty with neck pain intermittently since that time.  Getting massages regularly for this area does seem to keep this at [place].  She noticed about 10 days ago that she was having increasing neck pain that was radiating from the back of her head around to her forehead bilaterally.  She had one headache that felt similar to a migraine with photophobia, phonophobia, nausea, vomiting.  The pain felt like a pressure in her head.  She tried Tylenol which did not help.  She ended up going to sleep and sleeping off the migraine.  Since that time, she has been worried that she will get 1 of these again.  She has been taking Excedrin Migraine pills intermittently which seem to help.  Heating pad on her neck also helps.  Her massage therapist noted that the muscles on both sides of her neck are tighter than usual.  She was recommended to see a  chiropractor.  She does admit to a significant amount of stress recently which she thinks may have been worsening her neck pain and headaches.    Allergies  Allergen Reactions  . Percocet  [Oxycodone-Acetaminophen] Other (See Comments)    Hallucinations Hallucinations  . Levaquin [Levofloxacin In D5w] Diarrhea    Vaginal itching  . Lisinopril     cough  . Meloxicam     Upset stomach  . Penicillins Itching  . Preservision Areds 2 [Multiple Vitamins-Minerals] Nausea Only     Current Outpatient Medications:  .  ALPRAZolam (XANAX) 0.5 MG tablet, TAKE 1 TABLET BY MOUTH EVERY 12 HOURS AS NEEDED, Disp: 55 tablet, Rfl: 3 .  aspirin (ASPIRIN EC LO-DOSE) 81 MG EC tablet, Take 81 mg by mouth daily. , Disp: , Rfl:  .  azithromycin (ZITHROMAX) 250 MG tablet, Take two tablets for one day, then one everyday until gone., Disp: 6 tablet, Rfl: 0 .  DILT-XR 180 MG 24 hr capsule, TAKE ONE CAPSULE BY MOUTH ONCE DAILY, Disp: 90 capsule, Rfl: 3 .  diltiazem (DILACOR XR) 180 MG 24 hr capsule, TAKE ONE CAPSULE BY MOUTH ONCE DAILY, Disp: 30 capsule, Rfl: 11 .  losartan (COZAAR) 50 MG tablet, Take 1 tablet (50 mg total) by mouth daily., Disp: 90 tablet, Rfl: 3 .  omeprazole (PRILOSEC) 20 MG capsule, TAKE ONE CAPSULE BY MOUTH ONCE DAILY, Disp: 90 capsule, Rfl: 3 .  sertraline (ZOLOFT) 50 MG tablet, TAKE ONE TABLET BY MOUTH ONCE DAILY, Disp: 90 tablet, Rfl: 3  Review of Systems  Constitutional:  Negative.   HENT: Positive for tinnitus. Negative for congestion, dental problem, ear pain, facial swelling, hearing loss, rhinorrhea and sinus pressure.   Eyes: Negative for blurred vision and photophobia.  Respiratory: Negative.   Cardiovascular: Negative.   Gastrointestinal: Positive for nausea and vomiting.  Genitourinary: Negative.   Musculoskeletal: Positive for neck pain. Negative for arthralgias and myalgias.  Skin: Negative.   Neurological: Positive for headaches. Negative for dizziness, tremors, seizures,  syncope, facial asymmetry, speech difficulty, weakness, light-headedness, numbness and loss of balance.  Psychiatric/Behavioral: Negative.     Social History   Tobacco Use  . Smoking status: Never Smoker  . Smokeless tobacco: Never Used  . Tobacco comment: does not smoke  Substance Use Topics  . Alcohol use: Yes    Alcohol/week: 0.0 oz    Comment: rarely - wine   Objective:   BP (!) 144/86 (BP Location: Left Arm, Patient Position: Sitting, Cuff Size: Normal)   Pulse 75   Temp 98.4 F (36.9 C) (Oral)   Resp 16   SpO2 95%  Vitals:   03/24/18 1400  BP: (!) 144/86  Pulse: 75  Resp: 16  Temp: 98.4 F (36.9 C)  TempSrc: Oral  SpO2: 95%     Physical Exam  Constitutional: She is oriented to person, place, and time. She appears well-developed and well-nourished. She does not appear ill. No distress.  HENT:  Head: Normocephalic and atraumatic.  Eyes: Pupils are equal, round, and reactive to light. No scleral icterus.  Neck: Neck supple. No neck rigidity.  Cardiovascular: Normal rate, regular rhythm, normal heart sounds and intact distal pulses.  No murmur heard. Pulmonary/Chest: Effort normal and breath sounds normal. No respiratory distress.  Musculoskeletal:       Cervical back: She exhibits decreased range of motion and deformity (dowager hump). She exhibits no bony tenderness, no swelling and no edema.  Spasms of neck musculature.  No midline tenderness.  Lymphadenopathy:    She has no cervical adenopathy.  Neurological: She is alert and oriented to person, place, and time. She has normal strength. She is not disoriented. No cranial nerve deficit or sensory deficit. Coordination and gait normal.  Skin: Skin is warm and dry. Capillary refill takes less than 2 seconds.  Psychiatric: Her behavior is normal. Her mood appears anxious. She is not agitated.  Vitals reviewed.     Assessment & Plan:   1. Tension headache 2. Muscle spasms of neck -Patient with several  tension headaches with one possible migrainous headache -Neuro intact today without any red flag findings - Discussed possibility of rebound headaches using Excedrin Migraine as well as that it has aspirin and then she is Artie taking aspirin - Suspect that her neck spasms are contributing to her tension headaches as well as her stress - Discussed importance of D stressing and worrying about herself rather than other people all the time -Can use baclofen as needed for muscle spasms as this is safer in her age group and other muscle relaxants -Discussed avoiding other hypnotics such as her Xanax when taking baclofen -Discussed return precautions -Advised against chiropractic care given her previous surgical interventions with hardware of her neck -Continue massage and heat as these have been helpful   Meds ordered this encounter  Medications  . baclofen (LIORESAL) 10 MG tablet    Sig: Take 1 tablet (10 mg total) by mouth 3 (three) times daily as needed for muscle spasms.    Dispense:  60 each    Refill:  0  Return if symptoms worsen or fail to improve.   The entirety of the information documented in the History of Present Illness, Review of Systems and Physical Exam were personally obtained by me. Portions of this information were initially documented by Raquel Sarna Ratchford, CMA and reviewed by me for thoroughness and accuracy.    Virginia Crews, MD, MPH [facility] 03/24/2018 3:08 PM

## 2018-03-25 ENCOUNTER — Ambulatory Visit
Admission: RE | Admit: 2018-03-25 | Discharge: 2018-03-25 | Disposition: A | Discharge status: Home / Self Care | Payer: PPO | Source: Ambulatory Visit | Attending: General Surgery | Admitting: General Surgery

## 2018-03-25 DIAGNOSIS — R921 Mammographic calcification found on diagnostic imaging of breast: Secondary | ICD-10-CM | POA: Insufficient documentation

## 2018-03-25 DIAGNOSIS — R920 Mammographic microcalcification found on diagnostic imaging of breast: Secondary | ICD-10-CM

## 2018-04-20 DIAGNOSIS — L578 Other skin changes due to chronic exposure to nonionizing radiation: Secondary | ICD-10-CM | POA: Diagnosis not present

## 2018-04-20 DIAGNOSIS — I781 Nevus, non-neoplastic: Secondary | ICD-10-CM | POA: Diagnosis not present

## 2018-04-20 DIAGNOSIS — Z86018 Personal history of other benign neoplasm: Secondary | ICD-10-CM | POA: Diagnosis not present

## 2018-04-20 DIAGNOSIS — Z872 Personal history of diseases of the skin and subcutaneous tissue: Secondary | ICD-10-CM | POA: Diagnosis not present

## 2018-04-20 DIAGNOSIS — D485 Neoplasm of uncertain behavior of skin: Secondary | ICD-10-CM | POA: Diagnosis not present

## 2018-04-20 DIAGNOSIS — L821 Other seborrheic keratosis: Secondary | ICD-10-CM | POA: Diagnosis not present

## 2018-06-24 DIAGNOSIS — H01003 Unspecified blepharitis right eye, unspecified eyelid: Secondary | ICD-10-CM | POA: Diagnosis not present

## 2018-06-27 ENCOUNTER — Ambulatory Visit: Payer: PPO | Admitting: Family Medicine

## 2018-07-07 ENCOUNTER — Ambulatory Visit (INDEPENDENT_AMBULATORY_CARE_PROVIDER_SITE_OTHER): Payer: PPO | Admitting: Family Medicine

## 2018-07-07 ENCOUNTER — Encounter: Payer: Self-pay | Admitting: Family Medicine

## 2018-07-07 VITALS — BP 136/78 | HR 86 | Temp 98.6°F | Resp 16 | Wt 187.0 lb

## 2018-07-07 DIAGNOSIS — I10 Essential (primary) hypertension: Secondary | ICD-10-CM | POA: Diagnosis not present

## 2018-07-07 DIAGNOSIS — E7800 Pure hypercholesterolemia, unspecified: Secondary | ICD-10-CM | POA: Diagnosis not present

## 2018-07-07 DIAGNOSIS — H6590 Unspecified nonsuppurative otitis media, unspecified ear: Secondary | ICD-10-CM | POA: Diagnosis not present

## 2018-07-07 DIAGNOSIS — F411 Generalized anxiety disorder: Secondary | ICD-10-CM

## 2018-07-07 NOTE — Progress Notes (Signed)
Patient: Rita Cruz Female    DOB: Mar 20, 1941   77 y.o.   MRN: [id number] Visit Date: 07/07/2018  Today's Provider: Wilhemena Durie, MD   Chief Complaint  Patient presents with  . Hypertension   Subjective:    HPI Patient comes in today for a follow up. She was last seen in the office 6 months ago. She feels well with minor complaints. She is anxious as her husband is having bypass surgery next week at [facility]. She has a tender spot on the back of her head that is very tender that she is concerned about.     Allergies  Allergen Reactions  . Percocet  [Oxycodone-Acetaminophen] Other (See Comments)    Hallucinations Hallucinations  . Levaquin [Levofloxacin In D5w] Diarrhea    Vaginal itching  . Lisinopril     cough  . Meloxicam     Upset stomach  . Penicillins Itching  . Preservision Areds 2 [Multiple Vitamins-Minerals] Nausea Only     Current Outpatient Medications:  .  ALPRAZolam (XANAX) 0.5 MG tablet, TAKE 1 TABLET BY MOUTH EVERY 12 HOURS AS NEEDED, Disp: 55 tablet, Rfl: 3 .  aspirin (ASPIRIN EC LO-DOSE) 81 MG EC tablet, Take 81 mg by mouth daily. , Disp: , Rfl:  .  baclofen (LIORESAL) 10 MG tablet, Take 1 tablet (10 mg total) by mouth 3 (three) times daily as needed for muscle spasms., Disp: 60 each, Rfl: 0 .  diltiazem (DILACOR XR) 180 MG 24 hr capsule, TAKE ONE CAPSULE BY MOUTH ONCE DAILY, Disp: 30 capsule, Rfl: 11 .  azithromycin (ZITHROMAX) 250 MG tablet, Take two tablets for one day, then one everyday until gone. (Patient not taking: Reported on 07/07/2018), Disp: 6 tablet, Rfl: 0 .  losartan (COZAAR) 50 MG tablet, Take 1 tablet (50 mg total) by mouth daily., Disp: 90 tablet, Rfl: 3 .  omeprazole (PRILOSEC) 20 MG capsule, TAKE ONE CAPSULE BY MOUTH ONCE DAILY, Disp: 90 capsule, Rfl: 3 .  sertraline (ZOLOFT) 50 MG tablet, TAKE ONE TABLET BY MOUTH ONCE DAILY, Disp: 90 tablet, Rfl: 3  Review of Systems  Constitutional: Negative.   HENT: Negative.        Small ,mildly tender,nonfluctuant left occipital lymph node.  Eyes: Negative.   Respiratory: Negative.   Cardiovascular: Negative.   Gastrointestinal: Negative.   Endocrine: Negative.   Allergic/Immunologic: Negative.   Neurological: Negative.   Psychiatric/Behavioral: The patient is nervous/anxious.     Social History   Tobacco Use  . Smoking status: Never Smoker  . Smokeless tobacco: Never Used  . Tobacco comment: does not smoke  Substance Use Topics  . Alcohol use: Yes    Alcohol/week: 0.0 standard drinks    Comment: rarely - wine   Objective:   BP 136/78   Pulse 86   Temp 98.6 F (37 C)   Resp 16   Wt 187 lb (84.8 kg)   SpO2 95%   BMI 27.62 kg/m  Vitals:   07/07/18 1433  BP: 136/78  Pulse: 86  Resp: 16  Temp: 98.6 F (37 C)  SpO2: 95%  Weight: 187 lb (84.8 kg)     Physical Exam  Constitutional: She is oriented to person, place, and time. She appears well-developed and well-nourished.  HENT:  Head: Normocephalic and atraumatic.  Right Ear: External ear normal.  Left Ear: External ear normal.  Mouth/Throat: Oropharynx is clear and moist.  Eyes: Conjunctivae are normal. No scleral icterus.  Neck: No thyromegaly  present.  Cardiovascular: Normal rate, regular rhythm, normal heart sounds and intact distal pulses.  Pulmonary/Chest: Effort normal and breath sounds normal.  Lymphadenopathy:    She has no cervical adenopathy.  Neurological: She is alert and oriented to person, place, and time.  Skin: Skin is warm and dry.  Psychiatric: She has a normal mood and affect. Her behavior is normal. Judgment and thought content normal.        Assessment & Plan:     1. Anxiety, generalized Chronic. Pt has counseling through church.  2. Essential (primary) hypertension   3. Hypercholesteremia   4. Serous otitis media, unspecified chronicity, unspecified laterality Supportive care.       I have done the exam and reviewed the above chart and it is  accurate to the best of my knowledge. Development worker, community has been used in this note in any air is in the dictation or transcription are unintentional.  Wilhemena Durie, MD  [place]

## 2018-08-19 DIAGNOSIS — H43813 Vitreous degeneration, bilateral: Secondary | ICD-10-CM | POA: Diagnosis not present

## 2018-09-09 DIAGNOSIS — Z86010 Personal history of colonic polyps: Secondary | ICD-10-CM | POA: Diagnosis not present

## 2018-09-12 DIAGNOSIS — M1611 Unilateral primary osteoarthritis, right hip: Secondary | ICD-10-CM | POA: Diagnosis not present

## 2018-10-03 ENCOUNTER — Other Ambulatory Visit: Payer: Self-pay | Admitting: Physician Assistant

## 2018-10-03 DIAGNOSIS — F418 Other specified anxiety disorders: Secondary | ICD-10-CM

## 2018-10-05 NOTE — Telephone Encounter (Signed)
Pt is needing this Rx filled.  Has been waiting for 3 days  ALPRAZolam (XANAX) 0.5 MG tablet  Please fill at: [facility] [address] [place] [phone] (Phone) [phone] (Fax    Thanks, [facility]

## 2018-10-17 DIAGNOSIS — Z872 Personal history of diseases of the skin and subcutaneous tissue: Secondary | ICD-10-CM | POA: Diagnosis not present

## 2018-10-17 DIAGNOSIS — Z1283 Encounter for screening for malignant neoplasm of skin: Secondary | ICD-10-CM | POA: Diagnosis not present

## 2018-10-17 DIAGNOSIS — L578 Other skin changes due to chronic exposure to nonionizing radiation: Secondary | ICD-10-CM | POA: Diagnosis not present

## 2018-10-17 DIAGNOSIS — Z86018 Personal history of other benign neoplasm: Secondary | ICD-10-CM | POA: Diagnosis not present

## 2018-10-17 DIAGNOSIS — L570 Actinic keratosis: Secondary | ICD-10-CM | POA: Diagnosis not present

## 2018-10-18 ENCOUNTER — Ambulatory Visit (INDEPENDENT_AMBULATORY_CARE_PROVIDER_SITE_OTHER): Payer: PPO | Admitting: Family Medicine

## 2018-10-18 ENCOUNTER — Encounter: Payer: Self-pay | Admitting: Family Medicine

## 2018-10-18 VITALS — BP 146/72 | HR 76 | Temp 98.7°F | Resp 16 | Wt 182.0 lb

## 2018-10-18 DIAGNOSIS — I10 Essential (primary) hypertension: Secondary | ICD-10-CM | POA: Diagnosis not present

## 2018-10-18 DIAGNOSIS — F411 Generalized anxiety disorder: Secondary | ICD-10-CM | POA: Diagnosis not present

## 2018-10-18 NOTE — Progress Notes (Signed)
Patient: Rita Cruz Female    DOB: [birth year]   77 y.o.   MRN: [id number] Visit Date: 10/18/2018  Today's Provider: Wilhemena Durie, MD   Chief Complaint  Patient presents with  . Follow-up   Subjective:    HPI Anxiety, generalized: Patient was last seen for this problem 3 months ago and no changes were made. Patient reports good compliance with treatment, good tolerance and good symptom control.  Does not express anxiety today, she expresses anger.  Basically she is fearful about her husband's health status.   Hypertension, follow-up:  BP Readings from Last 3 Encounters:  10/18/18 (!) 146/72  07/07/18 136/78  03/24/18 (!) 144/86    She was last seen for hypertension 3 months ago.  BP at that visit was 136/78. Management since that visit includes no changes. She reports good compliance with treatment. She is not having side effects.  She is exercising. She is not adherent to low salt diet.   Outside blood pressures are not being checked. She is experiencing none.  Patient denies chest pain, chest pressure/discomfort, claudication, dyspnea, exertional chest pressure/discomfort, fatigue, irregular heart beat, lower extremity edema, near-syncope, orthopnea, palpitations, paroxysmal nocturnal dyspnea, syncope and tachypnea.   Cardiovascular risk factors include advanced age (older than 4 for men, 58 for women) and hypertension.  Use of agents associated with hypertension: NSAIDS.     Weight trend: stable Wt Readings from Last 3 Encounters:  10/18/18 182 lb (82.6 kg)  07/07/18 187 lb (84.8 kg)  02/03/18 174 lb (78.9 kg)    Current diet: in general, a "healthy" diet    ------------------------------------------------------------------------     Allergies  Allergen Reactions  . Percocet  [Oxycodone-Acetaminophen] Other (See Comments)    Hallucinations Hallucinations  . Levaquin [Levofloxacin In D5w] Diarrhea    Vaginal itching  . Lisinopril      cough  . Meloxicam     Upset stomach  . Penicillins Itching  . Preservision Areds 2 [Multiple Vitamins-Minerals] Nausea Only     Current Outpatient Medications:  .  ALPRAZolam (XANAX) 0.5 MG tablet, TAKE 1 TABLET BY MOUTH EVERY 12 HOURS AS NEEDED, Disp: 60 tablet, Rfl: 3 .  aspirin (ASPIRIN EC LO-DOSE) 81 MG EC tablet, Take 81 mg by mouth daily. , Disp: , Rfl:  .  diltiazem (DILACOR XR) 180 MG 24 hr capsule, TAKE ONE CAPSULE BY MOUTH ONCE DAILY, Disp: 30 capsule, Rfl: 11 .  losartan (COZAAR) 50 MG tablet, Take 1 tablet (50 mg total) by mouth daily., Disp: 90 tablet, Rfl: 3 .  omeprazole (PRILOSEC) 20 MG capsule, TAKE ONE CAPSULE BY MOUTH ONCE DAILY, Disp: 90 capsule, Rfl: 3 .  sertraline (ZOLOFT) 50 MG tablet, TAKE ONE TABLET BY MOUTH ONCE DAILY, Disp: 90 tablet, Rfl: 3 .  baclofen (LIORESAL) 10 MG tablet, Take 1 tablet (10 mg total) by mouth 3 (three) times daily as needed for muscle spasms. (Patient not taking: Reported on 10/18/2018), Disp: 60 each, Rfl: 0  Review of Systems  Constitutional: Negative for appetite change, chills, fatigue and fever.  HENT: Negative.   Eyes: Negative.   Respiratory: Negative for chest tightness and shortness of breath.   Cardiovascular: Negative for chest pain and palpitations.  Gastrointestinal: Negative for abdominal pain, nausea and vomiting.  Endocrine: Negative.   Allergic/Immunologic: Negative.   Neurological: Negative for dizziness and weakness.  Psychiatric/Behavioral: Negative.     Social History   Tobacco Use  . Smoking status: Never Smoker  .  Smokeless tobacco: Never Used  . Tobacco comment: does not smoke  Substance Use Topics  . Alcohol use: Yes    Alcohol/week: 0.0 standard drinks    Comment: rarely - wine   Objective:   BP (!) 146/72 (BP Location: Left Arm, Patient Position: Sitting, Cuff Size: Large)   Pulse 76   Temp 98.7 F (37.1 C) (Oral)   Resp 16   Wt 182 lb (82.6 kg)   SpO2 96% Comment: room air  BMI 26.88  kg/m  Vitals:   10/18/18 1432  BP: (!) 146/72  Pulse: 76  Resp: 16  Temp: 98.7 F (37.1 C)  TempSrc: Oral  SpO2: 96%  Weight: 182 lb (82.6 kg)     Physical Exam  Constitutional: She is oriented to person, place, and time. She appears well-developed and well-nourished.  HENT:  Head: Normocephalic and atraumatic.  Eyes: Conjunctivae are normal. No scleral icterus.  Neck: No thyromegaly present.  Cardiovascular: Normal rate, regular rhythm and normal heart sounds.  Pulmonary/Chest: Effort normal and breath sounds normal.  Abdominal: Soft.  Musculoskeletal: She exhibits no edema.  Neurological: She is alert and oriented to person, place, and time.  Skin: Skin is warm and dry.        Assessment & Plan:     1. Essential (primary) hypertension   2. Anxiety, generalized Major issue for this patient.  Recommend increasing Zoloft.  Patient would benefit from counseling.      I have done the exam and reviewed the above chart and it is accurate to the best of my knowledge. Development worker, community has been used in this note in any air is in the dictation or transcription are unintentional.  Wilhemena Durie, MD  [place]

## 2018-10-21 ENCOUNTER — Other Ambulatory Visit: Payer: Self-pay | Admitting: Family Medicine

## 2018-11-02 ENCOUNTER — Ambulatory Visit (INDEPENDENT_AMBULATORY_CARE_PROVIDER_SITE_OTHER): Payer: PPO | Admitting: Physician Assistant

## 2018-11-02 ENCOUNTER — Encounter: Payer: Self-pay | Admitting: Physician Assistant

## 2018-11-02 VITALS — BP 157/77 | HR 67 | Temp 98.0°F | Resp 16 | Wt 184.0 lb

## 2018-11-02 DIAGNOSIS — R300 Dysuria: Secondary | ICD-10-CM | POA: Diagnosis not present

## 2018-11-02 DIAGNOSIS — N8110 Cystocele, unspecified: Secondary | ICD-10-CM | POA: Diagnosis not present

## 2018-11-02 DIAGNOSIS — J0140 Acute pansinusitis, unspecified: Secondary | ICD-10-CM | POA: Diagnosis not present

## 2018-11-02 LAB — POCT URINALYSIS DIPSTICK
Bilirubin, UA: NEGATIVE
Blood, UA: NEGATIVE
Glucose, UA: NEGATIVE
Ketones, UA: NEGATIVE
Leukocytes, UA: NEGATIVE
Nitrite, UA: NEGATIVE
Protein, UA: NEGATIVE
Spec Grav, UA: 1.01 (ref 1.010–1.025)
Urobilinogen, UA: 0.2 E.U./dL
pH, UA: 6 (ref 5.0–8.0)

## 2018-11-02 MED ORDER — DOXYCYCLINE HYCLATE 100 MG PO TABS: 100.0000 mg | ORAL_TABLET | Freq: Two times a day (BID) | ORAL | 0 refills | Status: DC

## 2018-11-02 NOTE — Progress Notes (Signed)
Patient: Rita Cruz Female    DOB: 1941-09-20   77 y.o.   MRN: [id number] Visit Date: 11/02/2018  Today's Provider: Mar Daring, PA-C   Chief Complaint  Patient presents with  . URI   Subjective:    HPI Upper Respiratory Infection: Patient complains of symptoms of a URI. Symptoms include sore throat. Onset of symptoms was several weeks ago, unchanged since that time. She also c/o non productive cough and post nasal drip for the past 1 week .  She is drinking plenty of fluids. Evaluation to date: none. Treatment to date: none.  Urinary Tract Infection: Patient complains of frequency She has had symptoms for a few days. Patient also complains of pressure. Patient denies fever. Patient does not have a history of recurrent UTI.  Patient does not have a history of pyelonephritis. Took AZO over the last couple of days and symptoms are improving.      Allergies  Allergen Reactions  . Percocet  [Oxycodone-Acetaminophen] Other (See Comments)    Hallucinations Hallucinations  . Levaquin [Levofloxacin In D5w] Diarrhea    Vaginal itching  . Lisinopril     cough  . Meloxicam     Upset stomach  . Penicillins Itching  . Preservision Areds 2 [Multiple Vitamins-Minerals] Nausea Only     Current Outpatient Medications:  .  ALPRAZolam (XANAX) 0.5 MG tablet, TAKE 1 TABLET BY MOUTH EVERY 12 HOURS AS NEEDED, Disp: 60 tablet, Rfl: 3 .  aspirin (ASPIRIN EC LO-DOSE) 81 MG EC tablet, Take 81 mg by mouth daily. , Disp: , Rfl:  .  diltiazem (DILACOR XR) 180 MG 24 hr capsule, TAKE ONE CAPSULE BY MOUTH ONCE DAILY, Disp: 30 capsule, Rfl: 11 .  losartan (COZAAR) 50 MG tablet, Take 1 tablet (50 mg total) by mouth daily., Disp: 90 tablet, Rfl: 3 .  omeprazole (PRILOSEC) 20 MG capsule, TAKE 1 CAPSULE BY MOUTH ONCE DAILY, Disp: 90 capsule, Rfl: 3 .  sertraline (ZOLOFT) 50 MG tablet, TAKE ONE TABLET BY MOUTH ONCE DAILY, Disp: 90 tablet, Rfl: 3 .  baclofen (LIORESAL) 10 MG tablet,  Take 1 tablet (10 mg total) by mouth 3 (three) times daily as needed for muscle spasms. (Patient not taking: Reported on 10/18/2018), Disp: 60 each, Rfl: 0  Review of Systems  Constitutional: Negative.   HENT: Positive for congestion, postnasal drip, rhinorrhea, sore throat and voice change.   Respiratory: Positive for cough.   Cardiovascular: Negative.   Genitourinary: Positive for pelvic pain.    Social History   Tobacco Use  . Smoking status: Never Smoker  . Smokeless tobacco: Never Used  . Tobacco comment: does not smoke  Substance Use Topics  . Alcohol use: Yes    Alcohol/week: 0.0 standard drinks    Comment: rarely - wine   Objective:   BP (!) 157/77 (BP Location: Left Arm, Patient Position: Sitting, Cuff Size: Normal)   Pulse 67   Temp 98 F (36.7 C) (Oral)   Resp 16   Wt 184 lb (83.5 kg)   SpO2 95%   BMI 27.17 kg/m  Vitals:   11/02/18 1817  BP: (!) 157/77  Pulse: 67  Resp: 16  Temp: 98 F (36.7 C)  TempSrc: Oral  SpO2: 95%  Weight: 184 lb (83.5 kg)     Physical Exam Vitals signs reviewed.  Constitutional:      General: She is not in acute distress.    Appearance: She is well-developed. She is not diaphoretic.  HENT:     Head: Normocephalic and atraumatic.     Right Ear: Hearing, tympanic membrane, ear canal and external ear normal.     Left Ear: Hearing, tympanic membrane, ear canal and external ear normal.     Nose:     Right Sinus: Maxillary sinus tenderness and frontal sinus tenderness present.     Left Sinus: Maxillary sinus tenderness and frontal sinus tenderness present.     Mouth/Throat:     Pharynx: Uvula midline. No oropharyngeal exudate.  Neck:     Musculoskeletal: Normal range of motion and neck supple.     Thyroid: No thyromegaly.     Trachea: No tracheal deviation.  Cardiovascular:     Rate and Rhythm: Normal rate and regular rhythm.     Heart sounds: Normal heart sounds. No murmur. No friction rub. No gallop.   Pulmonary:      Effort: Pulmonary effort is normal. No respiratory distress.     Breath sounds: Normal breath sounds. No stridor. No wheezing or rales.  Abdominal:     General: Bowel sounds are normal. There is no distension.     Palpations: Abdomen is soft.     Tenderness: There is no abdominal tenderness. There is no right CVA tenderness or left CVA tenderness.  Lymphadenopathy:     Cervical: No cervical adenopathy.        Assessment & Plan:     1. Acute non-recurrent pansinusitis Worsening symptoms that have not responded to OTC medications. Will give Doxycycline as below. Continue allergy medications. Stay well hydrated and get plenty of rest. Call if no symptom improvement or if symptoms worsen. - doxycycline (VIBRA-TABS) 100 MG tablet; Take 1 tablet (100 mg total) by mouth 2 (two) times daily.  Dispense: 20 tablet; Refill: 0  2. Dysuria UA normal. Will send for culture since it has been a while since she has had UTI to make sure no bacterial growth. Patient started AZO tabs and symptoms improved.  - POCT urinalysis dipstick - Urine Culture  3. 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 Does report some pelvic/vaginal pain with BM as well. Was told she had cystocele and most likely rectocele at her annual physical. Discussed conservative management with pelvic floor therapy (patient reports she did this many years ago), pessary placement and OAB medications (risks discussed). She is considering pessary management and will call if she decides to go this route. Will refer to GYN once patient decides.        Mar Daring, PA-C  [facility]

## 2018-11-02 NOTE — Patient Instructions (Signed)
Pelvic Organ Prolapse Pelvic organ prolapse is the stretching, bulging, or dropping of pelvic organs into an abnormal position. It happens when the muscles and tissues that surround and support pelvic structures are stretched or weak. Pelvic organ prolapse can involve:  Vagina (vaginal prolapse).  Uterus (uterine prolapse).  Bladder (cystocele).  Rectum (rectocele).  Intestines (enterocele).  When organs other than the vagina are involved, they often bulge into the vagina or protrude from the vagina, depending on how severe the prolapse is. What are the causes? Causes of this condition include:  Pregnancy, labor, and childbirth.  Long-lasting (chronic) cough.  Chronic constipation.  Obesity.  Past pelvic surgery.  Aging. During and after menopause, a decreased production of the hormone estrogen can weaken pelvic ligaments and muscles.  Consistently lifting more than 50 lb (23 kg).  Buildup of fluid in the abdomen due to certain diseases and other conditions.  What are the signs or symptoms? Symptoms of this condition include:  Loss of bladder control when you cough, sneeze, strain, and exercise (stress incontinence). This may be worse immediately following childbirth, and it may gradually improve over time.  Feeling pressure in your pelvis or vagina. This pressure may increase when you cough or when you are having a bowel movement.  A bulge that protrudes from the opening of your vagina or against your vaginal wall. If your uterus protrudes through the opening of your vagina and rubs against your clothing, you may also experience soreness, ulcers, infection, pain, and bleeding.  Increased effort to have a bowel movement or urinate.  Pain in your low back.  Pain, discomfort, or disinterest in sexual intercourse.  Repeated bladder infections (urinary tract infections).  Difficulty inserting or inability to insert a tampon or applicator.  In some people, this  condition does not cause any symptoms. How is this diagnosed? Your health care provider may perform an internal and external vaginal and rectal exam. During the exam, you may be asked to cough and strain while you are lying down, sitting, and standing up. Your health care provider will determine if other tests are required, such as bladder function tests. How is this treated? In most cases, this condition needs to be treated only if it produces symptoms. No treatment is guaranteed to correct the prolapse or relieve the symptoms completely. Treatment may include:  Lifestyle changes, such as: ? Avoiding drinking beverages that contain caffeine. ? Increasing your intake of high-fiber foods. This can help to decrease constipation and straining during bowel movements. ? Emptying your bladder at scheduled times (bladder training therapy). This can help to reduce or avoid urinary incontinence. ? Losing weight if you are overweight or obese.  Estrogen. Estrogen may help mild prolapse by increasing the strength and tone of pelvic floor muscles.  Kegel exercises. These may help mild cases of prolapse by strengthening and tightening the muscles of the pelvic floor.  Pessary insertion. A pessary is a soft, flexible device that is placed into your vagina by your health care provider to help support the vaginal walls and keep pelvic organs in place.  Surgery. This is often the only form of treatment for severe prolapse. Different types of surgeries are available.  Follow these instructions at home:  Wear a sanitary pad or absorbent product if you have urinary incontinence.  Avoid heavy lifting and straining with exercise and work. Do not hold your breath when you perform mild to moderate lifting and exercise activities. Limit your activities as directed by your health care   provider.  Take medicines only as directed by your health care provider.  Perform Kegel exercises as directed by your health care  provider.  If you have a pessary, take care of it as directed by your health care provider. Contact a health care provider if:  Your symptoms interfere with your daily activities or sex life.  You need medicine to help with the discomfort.  You notice bleeding from the vagina that is not related to your period.  You have a fever.  You have pain or bleeding when you urinate.  You have bleeding when you have a bowel movement.  You lose urine when you have sex.  You have chronic constipation.  You have a pessary that falls out.  You have vaginal discharge that has a bad smell.  You have low abdominal pain or cramping that is unusual for you. This information is not intended to replace advice given to you by your health care provider. Make sure you discuss any questions you have with your health care provider. Document Released: 06/06/2014 Document Revised: 04/16/2016 Document Reviewed: 01/22/2014 Elsevier Interactive Patient Education  2018 [employer]  

## 2018-11-05 ENCOUNTER — Other Ambulatory Visit: Payer: Self-pay | Admitting: Family Medicine

## 2018-11-06 ENCOUNTER — Other Ambulatory Visit: Payer: Self-pay | Admitting: Family Medicine

## 2018-11-06 LAB — URINE CULTURE

## 2018-11-07 ENCOUNTER — Telehealth: Payer: Self-pay | Admitting: *Deleted

## 2018-11-07 NOTE — Telephone Encounter (Signed)
No answer and no vm

## 2018-11-07 NOTE — Telephone Encounter (Signed)
-----   Message from Mar Daring, PA-C sent at 11/07/2018  8:19 AM EST ----- Urine culture negative.

## 2018-11-10 NOTE — Telephone Encounter (Signed)
Tried calling patient, no answer. Not able to leave a VM. Will try again later.

## 2018-11-14 NOTE — Telephone Encounter (Signed)
Advised patient of results.  

## 2018-12-20 ENCOUNTER — Ambulatory Visit (INDEPENDENT_AMBULATORY_CARE_PROVIDER_SITE_OTHER): Payer: PPO

## 2018-12-20 VITALS — BP 132/64 | HR 83 | Temp 98.4°F | Ht 69.0 in | Wt 181.4 lb

## 2018-12-20 DIAGNOSIS — Z0000 Encounter for general adult medical examination without abnormal findings: Secondary | ICD-10-CM | POA: Diagnosis not present

## 2018-12-20 DIAGNOSIS — Z13820 Encounter for screening for osteoporosis: Secondary | ICD-10-CM | POA: Diagnosis not present

## 2018-12-20 NOTE — Progress Notes (Signed)
Subjective:   Rita Cruz is a 78 y.o. female who presents for Medicare Annual (Subsequent) preventive examination.  Review of Systems:  N/A  Cardiac Risk Factors include: advanced age (>64men, >59 women);hypertension     Objective:     Vitals: BP 132/64 (BP Location: Right Arm)   Pulse 83   Temp 98.4 F (36.9 C) (Oral)   Ht 5\' 9"  (1.753 m)   Wt 181 lb 6.4 oz (82.3 kg)   BMI 26.79 kg/m   Body mass index is 26.79 kg/m.  Advanced Directives 12/20/2018 12/07/2017 12/08/2016 12/08/2016 10/06/2016  Does Patient Have a Medical Advance Directive? Yes Yes Yes Yes No  Type of Paramedic of [place];Living will [place];Living will - Living will;Healthcare Power of Attorney -  Copy of [place] in Chart? Yes - validated most recent copy scanned in chart (See row information) Yes - No - copy requested -  Would patient like information on creating a medical advance directive? - - - - No - patient declined information    Tobacco Social History   Tobacco Use  Smoking Status Never Smoker  Smokeless Tobacco Never Used  Tobacco Comment   does not smoke     Counseling given: Not Answered Comment: does not smoke   Clinical Intake:  Pre-visit preparation completed: Yes  Pain : 0-10 Pain Score: 5  Pain Type: Chronic pain Pain Location: Neck(Also has pain in the lower back down the right side. ) Pain Orientation: Right Pain Descriptors / Indicators: Sore Pain Frequency: Constant     Nutritional Status: BMI 25 -29 Overweight Nutritional Risks: Nausea/ vomitting/ diarrhea(nausea and vomiting occasionally due to migraines. Has diarrhea occasionally. Colonoscopy scheduled for 12/2018.) Diabetes: No  How often do you need to have someone help you when you read instructions, pamphlets, or other written materials from your doctor or pharmacy?: 1 - Never  Interpreter Needed?: No  Information entered by ::  [facility], LPN  Past Medical History:  Diagnosis Date  . Adenomatous polyps   . Anxiety   . Arthritis    neck  . Capsulitis of toe of right foot   . Chest pain    Adenosine Myoview in 2006 done for atypical chest pain, showed no eviden e for ischemia or infarct  . Colon polyps   . GERD (gastroesophageal reflux disease)   . History of colonic polyps   . HTN (hypertension)   . Hyperlipidemia   . PUD (peptic ulcer disease)    with hx of H. Pylori   Past Surgical History:  Procedure Laterality Date  . ABDOMINAL HYSTERECTOMY    . BREAST BIOPSY Right 2011   benign mass done in Dr. [place] office  . BREAST BIOPSY Right 06/29/2015   top hat marker. PROLIFERATIVE FIBROCYSTIC CHANGE WITH ASSOCIATED   . CERVICAL DISCECTOMY    . CHOLECYSTECTOMY    . COLONOSCOPY WITH PROPOFOL N/A 10/06/2016   Procedure: COLONOSCOPY WITH PROPOFOL;  Surgeon: Lollie Sails, MD;  Location: [facility] ENDOSCOPY;  Service: Endoscopy;  Laterality: N/A;  . EYE SURGERY    . hysterectomy-unspecified area     Family History  Problem Relation Age of Onset  . Kidney failure Father   . Heart failure Father   . Diabetes Father   . COPD Mother   . Colon cancer Mother   . Heart attack Mother   . Ulcers Mother   . Osteoporosis Mother   . Macular degeneration Mother   . Hypertension  Brother   . Irritable bowel syndrome Daughter   . Colon polyps Daughter   . Kidney Stones Son   . Bipolar disorder Son   . Melanoma Son        stage IV  . Hypertension Son   . Kidney cancer Other    Social History   Socioeconomic History  . Marital status: Married    Spouse name: Not on file  . Number of children: 2  . Years of education: Not on file  . Highest education level: Some college, no degree  Occupational History  . Occupation: retired  Scientific laboratory technician  . Financial resource strain: Not hard at all  . Food insecurity:    Worry: Never true    Inability: Never true  . Transportation needs:    Medical: No     Non-medical: No  Tobacco Use  . Smoking status: Never Smoker  . Smokeless tobacco: Never Used  . Tobacco comment: does not smoke  Substance and Sexual Activity  . Alcohol use: Yes    Alcohol/week: 0.0 standard drinks    Comment: rarely - wine  . Drug use: No  . Sexual activity: Not on file  Lifestyle  . Physical activity:    Days per week: 0 days    Minutes per session: 0 min  . Stress: Very much  Relationships  . Social connections:    Talks on phone: Patient refused    Gets together: Patient refused    Attends religious service: Patient refused    Active member of club or organization: Patient refused    Attends meetings of clubs or organizations: Patient refused    Relationship status: Patient refused  Other Topics Concern  . Not on file  Social History Narrative   Married-2nd marriage. She has 2 children and her husband has 2 children.     Outpatient Encounter Medications as of 12/20/2018  Medication Sig  . ALPRAZolam (XANAX) 0.5 MG tablet TAKE 1 TABLET BY MOUTH EVERY 12 HOURS AS NEEDED  . aspirin (ASPIRIN EC LO-DOSE) 81 MG EC tablet Take 81 mg by mouth daily.   . baclofen (LIORESAL) 10 MG tablet Take 1 tablet (10 mg total) by mouth 3 (three) times daily as needed for muscle spasms.  Marland Kitchen diltiazem (DILACOR XR) 180 MG 24 hr capsule TAKE ONE CAPSULE BY MOUTH ONCE DAILY  . losartan (COZAAR) 50 MG tablet TAKE 1 TABLET BY MOUTH ONCE DAILY  . Multiple Vitamins-Minerals (PRESERVISION AREDS 2 PO) Take 1 tablet by mouth 2 (two) times daily.  Marland Kitchen omeprazole (PRILOSEC) 20 MG capsule TAKE 1 CAPSULE BY MOUTH ONCE DAILY  . sertraline (ZOLOFT) 50 MG tablet TAKE 1 TABLET BY MOUTH ONCE DAILY  . doxycycline (VIBRA-TABS) 100 MG tablet Take 1 tablet (100 mg total) by mouth 2 (two) times daily. (Patient not taking: Reported on 12/20/2018)   No facility-administered encounter medications on file as of 12/20/2018.     Activities of Daily Living In your present state of health, do you have any  difficulty performing the following activities: 12/20/2018  Hearing? N  Vision? N  Comment Due to MD.  Difficulty concentrating or making decisions? Y  Walking or climbing stairs? N  Dressing or bathing? N  Doing errands, shopping? N  Preparing Food and eating ? N  Using the Toilet? N  In the past six months, have you accidently leaked urine? Y  Comment Wears protection at night.   Do you have problems with loss of bowel control? Darreld Mclean  Comment Rarely due to possible IBS?  Managing your Medications? N  Managing your Finances? N  Housekeeping or managing your Housekeeping? N  Some recent data might be hidden    Patient Care Team: Jerrol Banana., MD as PCP - General (Family Medicine) Birder Robson, MD as Referring Physician (Ophthalmology) Sharlet Salina, MD as Referring Physician (Physical Medicine and Rehabilitation) Margaretha Sheffield, MD (Otolaryngology) Lollie Sails, MD as Consulting Physician (Gastroenterology) Brita Romp Dionne Bucy, MD as Consulting Physician (Family Medicine)    Assessment:   This is a routine wellness examination for Jasmine Awe.  Exercise Activities and Dietary recommendations Current Exercise Habits: The patient does not participate in regular exercise at present, Exercise limited by: orthopedic condition(s)  Goals    . DIET - DECREASE SODA OR JUICE INTAKE     Recommend to cut back to 1 soda a day and increase water intake to 4-6 8 oz glasses a day.     Marland Kitchen DIET - INCREASE WATER INTAKE     Recommend increasing water intake 6-8 glasses of water a day.     . Increase water intake     Starting 12/08/16, I will work to increase my water intake to 4-5 glasses a day.       Fall Risk Fall Risk  12/20/2018 12/07/2017 12/08/2016 07/22/2015  Falls in the past year? 0 No No No   FALL RISK PREVENTION PERTAINING TO THE HOME:  Any stairs in or around the home WITH handrails? Yes  Home free of loose throw rugs in walkways, pet beds, electrical cords,  etc? Yes  Adequate lighting in your home to reduce risk of falls? Yes   ASSISTIVE DEVICES UTILIZED TO PREVENT FALLS:  Life alert? No  Use of a cane, walker or w/c? No Grab bars in the bathroom? No  Shower chair or bench in shower? No  Elevated toilet seat or a handicapped toilet? No    TIMED UP AND GO:  Was the test performed? No .     Depression Screen PHQ 2/9 Scores 12/20/2018 12/20/2018 12/07/2017 12/08/2016  PHQ - 2 Score 1 1 0 1  PHQ- 9 Score 2 - - -     Cognitive Function     6CIT Screen 12/20/2018 12/07/2017 12/08/2016  What Year? 0 points 0 points 0 points  What month? 0 points 0 points 0 points  What time? 0 points 0 points 0 points  Count back from 20 0 points 0 points 0 points  Months in reverse 0 points 0 points 0 points  Repeat phrase 2 points 0 points 0 points  Total Score 2 0 0    Immunization History  Administered Date(s) Administered  . Influenza, High Dose Seasonal PF 08/25/2017, 08/13/2018  . Pneumococcal Conjugate-13 05/17/2014  . Pneumococcal Polysaccharide-23 04/11/2012  . Td 04/08/2010  . Tdap 04/11/2012  . Zoster 02/04/2009    Qualifies for Shingles Vaccine? Yes  Zostavax completed 02/04/09. Due for Shingrix. Education has been provided regarding the importance of this vaccine. Pt has been advised to call insurance company to determine out of pocket expense. Advised may also receive vaccine at local pharmacy or Health Dept. Verbalized acceptance and understanding.  Tdap: Up to date  Flu Vaccine: Up to date  Pneumococcal Vaccine: Up to date   Screening Tests Health Maintenance  Topic Date Due  . DEXA SCAN  09/26/2015  . COLONOSCOPY  10/07/2019  . TETANUS/TDAP  04/11/2022  . INFLUENZA VACCINE  Completed  . PNA vac Low Risk  Adult  Completed    Cancer Screenings:  Colorectal Screening: Completed 10/06/16. Repeat every 3 years.  Mammogram: Completed 03/25/18.   Bone Density: Completed 09/25/10. Results reflect OSTEOPENIA. Repeat  every 5 years. Ordered today. Pt aware the office will call re: appt.  Lung Cancer Screening: (Low Dose CT Chest recommended if Age 43-80 years, 30 pack-year currently smoking OR have quit w/in 15years.) does not qualify.    Additional Screening:  Vision Screening: Recommended annual ophthalmology exams for early detection of glaucoma and other disorders of the eye.  Dental Screening: Recommended annual dental exams for proper oral hygiene  Community Resource Referral:  CRR required this visit?  No       Plan:  I have personally reviewed and addressed the Medicare Annual Wellness questionnaire and have noted the following in the patient's chart:  A. Medical and social history B. Use of alcohol, tobacco or illicit drugs  C. Current medications and supplements D. Functional ability and status E.  Nutritional status F.  Physical activity G. Advance directives H. List of other physicians I.  Hospitalizations, surgeries, and ER visits in previous 12 months J.  [place] such as hearing and vision if needed, cognitive and depression L. Referrals and appointments - none  In addition, I have reviewed and discussed with patient certain preventive protocols, quality metrics, and best practice recommendations. A written personalized care plan for preventive services as well as general preventive health recommendations were provided to patient.  See attached scanned questionnaire for additional information.   Signed,  Fabio Neighbors, LPN Nurse Health Advisor   Nurse Recommendations: None.

## 2018-12-20 NOTE — Patient Instructions (Signed)
[name] , Thank you for taking time to come for your Medicare Wellness Visit. I appreciate your ongoing commitment to your health goals. Please review the following plan we discussed and let me know if I can assist you in the future.   Screening recommendations/referrals: Colonoscopy: Up to date, due 09/2019 Mammogram: Up to date, due 10/2020 Bone Density: Ordered today. Pt aware the office will call re: appt. Recommended yearly ophthalmology/optometry visit for glaucoma screening and checkup Recommended yearly dental visit for hygiene and checkup  Vaccinations: Influenza vaccine: Up to date Pneumococcal vaccine: Completed series Tdap vaccine: Up to date, due 03/2022 Shingles vaccine: Pt declines today.     Advanced directives: Currently on file.   Conditions/risks identified: Recommend to cut back to 1 soda a day and increase water intake to 4-6 8 oz glasses a day.   Next appointment: 01/24/19 with Dr Rosanna Randy.    Preventive Care 77 Years and Older, Female Preventive care refers to lifestyle choices and visits with your health care provider that can promote health and wellness. What does preventive care include?  A yearly physical exam. This is also called an annual well check.  Dental exams once or twice a year.  Routine eye exams. Ask your health care provider how often you should have your eyes checked.  Personal lifestyle choices, including:  Daily care of your teeth and gums.  Regular physical activity.  Eating a healthy diet.  Avoiding tobacco and drug use.  Limiting alcohol use.  Practicing safe sex.  Taking low-dose aspirin every day.  Taking vitamin and mineral supplements as recommended by your health care provider. What happens during an annual well check? The services and screenings done by your health care provider during your annual well check will depend on your age, overall health, lifestyle risk factors, and family history of  disease. Counseling  Your health care provider may ask you questions about your:  Alcohol use.  Tobacco use.  Drug use.  Emotional well-being.  Home and relationship well-being.  Sexual activity.  Eating habits.  History of falls.  Memory and ability to understand (cognition).  Work and work Statistician.  Reproductive health. Screening  You may have the following tests or measurements:  Height, weight, and BMI.  Blood pressure.  Lipid and cholesterol levels. These may be checked every 5 years, or more frequently if you are over 17 years old.  Skin check.  Lung cancer screening. You may have this screening every year starting at age 69 if you have a 30-pack-year history of smoking and currently smoke or have quit within the past 15 years.  Fecal occult blood test (FOBT) of the stool. You may have this test every year starting at age 56.  Flexible sigmoidoscopy or colonoscopy. You may have a sigmoidoscopy every 5 years or a colonoscopy every 10 years starting at age 35.  Hepatitis C blood test.  Hepatitis B blood test.  Sexually transmitted disease (STD) testing.  Diabetes screening. This is done by checking your blood sugar (glucose) after you have not eaten for a while (fasting). You may have this done every 1-3 years.  Bone density scan. This is done to screen for osteoporosis. You may have this done starting at age 77.  Mammogram. This may be done every 1-2 years. Talk to your health care provider about how often you should have regular mammograms. Talk with your health care provider about your test results, treatment options, and if necessary, the need for more tests. Vaccines  Your health care provider may recommend certain vaccines, such as:  Influenza vaccine. This is recommended every year.  Tetanus, diphtheria, and acellular pertussis (Tdap, Td) vaccine. You may need a Td booster every 10 years.  Zoster vaccine. You may need this after age  77.  Pneumococcal 13-valent conjugate (PCV13) vaccine. One dose is recommended after age 19.  Pneumococcal polysaccharide (PPSV23) vaccine. One dose is recommended after age 2. Talk to your health care provider about which screenings and vaccines you need and how often you need them. This information is not intended to replace advice given to you by your health care provider. Make sure you discuss any questions you have with your health care provider. Document Released: 12/06/2015 Document Revised: 07/29/2016 Document Reviewed: 09/10/2015 Elsevier Interactive Patient Education  2017 [employer] Prevention in the Home Falls can cause injuries. They can happen to people of all ages. There are many things you can do to make your home safe and to help prevent falls. What can I do on the outside of my home?  Regularly fix the edges of walkways and driveways and fix any cracks.  Remove anything that might make you trip as you walk through a door, such as a raised step or threshold.  Trim any bushes or trees on the path to your home.  Use bright outdoor lighting.  Clear any walking paths of anything that might make someone trip, such as rocks or tools.  Regularly check to see if handrails are loose or broken. Make sure that both sides of any steps have handrails.  Any raised decks and porches should have guardrails on the edges.  Have any leaves, snow, or ice cleared regularly.  Use sand or salt on walking paths during winter.  Clean up any spills in your garage right away. This includes oil or grease spills. What can I do in the bathroom?  Use night lights.  Install grab bars by the toilet and in the tub and shower. Do not use towel bars as grab bars.  Use non-skid mats or decals in the tub or shower.  If you need to sit down in the shower, use a plastic, non-slip stool.  Keep the floor dry. Clean up any water that spills on the floor as soon as it happens.  Remove  soap buildup in the tub or shower regularly.  Attach bath mats securely with double-sided non-slip rug tape.  Do not have throw rugs and other things on the floor that can make you trip. What can I do in the bedroom?  Use night lights.  Make sure that you have a light by your bed that is easy to reach.  Do not use any sheets or blankets that are too big for your bed. They should not hang down onto the floor.  Have a firm chair that has side arms. You can use this for support while you get dressed.  Do not have throw rugs and other things on the floor that can make you trip. What can I do in the kitchen?  Clean up any spills right away.  Avoid walking on wet floors.  Keep items that you use a lot in easy-to-reach places.  If you need to reach something above you, use a strong step stool that has a grab bar.  Keep electrical cords out of the way.  Do not use floor polish or wax that makes floors slippery. If you must use wax, use non-skid floor wax.  Do  not have throw rugs and other things on the floor that can make you trip. What can I do with my stairs?  Do not leave any items on the stairs.  Make sure that there are handrails on both sides of the stairs and use them. Fix handrails that are broken or loose. Make sure that handrails are as long as the stairways.  Check any carpeting to make sure that it is firmly attached to the stairs. Fix any carpet that is loose or worn.  Avoid having throw rugs at the top or bottom of the stairs. If you do have throw rugs, attach them to the floor with carpet tape.  Make sure that you have a light switch at the top of the stairs and the bottom of the stairs. If you do not have them, ask someone to add them for you. What else can I do to help prevent falls?  Wear shoes that:  Do not have high heels.  Have rubber bottoms.  Are comfortable and fit you well.  Are closed at the toe. Do not wear sandals.  If you use a  stepladder:  Make sure that it is fully opened. Do not climb a closed stepladder.  Make sure that both sides of the stepladder are locked into place.  Ask someone to hold it for you, if possible.  Clearly mark and make sure that you can see:  Any grab bars or handrails.  First and last steps.  Where the edge of each step is.  Use tools that help you move around (mobility aids) if they are needed. These include:  Canes.  Walkers.  Scooters.  Crutches.  Turn on the lights when you go into a dark area. Replace any light bulbs as soon as they burn out.  Set up your furniture so you have a clear path. Avoid moving your furniture around.  If any of your floors are uneven, fix them.  If there are any pets around you, be aware of where they are.  Review your medicines with your doctor. Some medicines can make you feel dizzy. This can increase your chance of falling. Ask your doctor what other things that you can do to help prevent falls. This information is not intended to replace advice given to you by your health care provider. Make sure you discuss any questions you have with your health care provider. Document Released: 09/05/2009 Document Revised: 04/16/2016 Document Reviewed: 12/14/2014 Elsevier Interactive Patient Education  2017 [employer].

## 2018-12-23 ENCOUNTER — Encounter: Payer: Self-pay | Admitting: Family Medicine

## 2018-12-23 ENCOUNTER — Ambulatory Visit (INDEPENDENT_AMBULATORY_CARE_PROVIDER_SITE_OTHER): Payer: PPO | Admitting: Family Medicine

## 2018-12-23 VITALS — BP 148/80 | HR 85 | Temp 98.3°F | Wt 182.2 lb

## 2018-12-23 DIAGNOSIS — G8929 Other chronic pain: Secondary | ICD-10-CM | POA: Diagnosis not present

## 2018-12-23 DIAGNOSIS — H6981 Other specified disorders of Eustachian tube, right ear: Secondary | ICD-10-CM

## 2018-12-23 DIAGNOSIS — M7061 Trochanteric bursitis, right hip: Secondary | ICD-10-CM

## 2018-12-23 DIAGNOSIS — M5441 Lumbago with sciatica, right side: Secondary | ICD-10-CM

## 2018-12-23 DIAGNOSIS — M62838 Other muscle spasm: Secondary | ICD-10-CM

## 2018-12-23 MED ORDER — BACLOFEN 10 MG PO TABS: 10.0000 mg | ORAL_TABLET | Freq: Three times a day (TID) | ORAL | 3 refills | Status: DC | PRN

## 2018-12-23 MED ORDER — METHYLPREDNISOLONE ACETATE 40 MG/ML IJ SUSP
40.0000 mg | Freq: Once | INTRAMUSCULAR | Status: AC
  Administered 2018-12-23: 40 mg via INTRAMUSCULAR

## 2018-12-23 MED ORDER — LIDOCAINE HCL (PF) 1 % IJ SOLN
4.0000 mL | Freq: Once | INTRAMUSCULAR | Status: AC
  Administered 2018-12-23: 4 mL via INTRADERMAL

## 2018-12-23 NOTE — Patient Instructions (Signed)
Hip Bursitis    Hip bursitis is inflammation of a fluid-filled sac (bursa) in the hip joint. The bursa protects the bones in the hip joint from rubbing against each other. Hip bursitis can cause mild to moderate pain, and symptoms often come and go over time.  What are the causes?  This condition may be caused by:   Injury to the hip.   Overuse of the muscles that surround the hip joint.   Arthritis or gout.   Diabetes.   Thyroid disease.   Cold weather.   Infection.  In some cases, the cause may not be known.  What are the signs or symptoms?  Symptoms of this condition may include:   Mild or moderate pain in the hip area. Pain may get worse with movement.   Tenderness and swelling of the hip, especially on the outer side of the hip.  Symptoms may come and go. If the bursa becomes infected, you may have the following symptoms:   Fever.   Red skin and a feeling of warmth in the hip area.  How is this diagnosed?  This condition may be diagnosed based on:   A physical exam.   Your medical history.   X-rays.   Removal of fluid from your inflamed bursa for testing (biopsy).  You may be sent to a health care provider who specializes in bone diseases (orthopedist) or a provider who specializes in joint inflammation (rheumatologist).  How is this treated?  This condition is treated by resting, raising (elevating), and applying pressure(compression) to the injured area. In some cases, this may be enough to make your symptoms go away. Treatment may also include:   Crutches.   Antibiotic medicine.   Draining fluid out of the bursa to help relieve swelling.   Injecting medicine that helps to reduce inflammation (cortisone).  Follow these instructions at home:  Medicines   Take over-the-counter and prescription medicines only as told by your health care provider.   Do not drive or operate heavy machinery while taking prescription pain medicine, or as told by your health care provider.   If you were  prescribed an antibiotic, take it as told by your health care provider. Do not stop taking the antibiotic even if you start to feel better.  Activity   Return to your normal activities as told by your health care provider. Ask your health care provider what activities are safe for you.   Rest and protect your hip as much as possible until your pain and swelling get better.  General instructions   Wear compression wraps only as told by your health care provider.   Elevate your hip above the level of your heart as much as you can without pain. To do this, try putting a pillow under your hips while you lie down.   Do not use your hip to support your body weight until your health care provider says that you can. Use crutches as told by your health care provider.   Gently massage and stretch your injured area as often as is comfortable.   Keep all follow-up visits as told by your health care provider. This is important.  How is this prevented?   Exercise regularly, as told by your health care provider.   Warm up and stretch before being active.   Cool down and stretch after being active.   If an activity irritates your hip or causes pain, avoid the activity as much as possible.   Avoid sitting   down for long periods at a time.  Contact a health care provider if:   You have a fever.   You develop new symptoms.   You have difficulty walking or doing everyday activities.   You have pain that gets worse or does not get better with medicine.   You develop red skin or a feeling of warmth in your hip area.  Get help right away if:   You cannot move your hip.   You have severe pain.  This information is not intended to replace advice given to you by your health care provider. Make sure you discuss any questions you have with your health care provider.  Document Released: 05/01/2002 Document Revised: 04/16/2016 Document Reviewed: 06/11/2015  Elsevier Interactive Patient Education  2019 [employer]

## 2018-12-23 NOTE — Progress Notes (Signed)
Patient: Rita Cruz Female    DOB: June 11, 1941   78 y.o.   MRN: [id number] Visit Date: 12/23/2018  Today's Provider: [name], MD   Chief Complaint  Patient presents with  . Neck Pain  . Hip Pain   Subjective:    I, Tiburcio Pea, CMA, am acting as a scribe for [name], MD.    Neck Pain   This is a recurrent problem. The problem occurs constantly. The problem has been unchanged. The pain is associated with nothing. Pain location: between shoulder blades. Associated symptoms include weakness. Treatments tried: Meloxicam. The treatment provided mild relief.  Back Pain  This is a recurrent problem. Episode onset: 2 days ago. The problem occurs constantly. The problem has been gradually worsening since onset. The pain is present in the lumbar spine. The pain radiates to the right thigh. Associated symptoms include weakness. (Hip pain, neck pain, and leg pain ) Treatments tried: Steroid injective from Dr. Sharlet Salina and Meloxicam. The treatment provided moderate relief.   Baclofen helps a lot when she uses irregularly  She also complains of R ear fullness.   Allergies  Allergen Reactions  . Percocet  [Oxycodone-Acetaminophen] Other (See Comments)    Hallucinations Hallucinations  . Levaquin [Levofloxacin In D5w] Diarrhea    Vaginal itching  . Lisinopril     cough  . Meloxicam     Upset stomach  . Penicillins Itching  . Preservision Areds 2 [Multiple Vitamins-Minerals] Nausea Only     Current Outpatient Medications:  .  ALPRAZolam (XANAX) 0.5 MG tablet, TAKE 1 TABLET BY MOUTH EVERY 12 HOURS AS NEEDED, Disp: 60 tablet, Rfl: 3 .  aspirin (ASPIRIN EC LO-DOSE) 81 MG EC tablet, Take 81 mg by mouth daily. , Disp: , Rfl:  .  baclofen (LIORESAL) 10 MG tablet, Take 1 tablet (10 mg total) by mouth 3 (three) times daily as needed for muscle spasms., Disp: 60 each, Rfl: 0 .  diltiazem (DILACOR XR) 180 MG 24 hr capsule, TAKE ONE CAPSULE BY MOUTH ONCE DAILY,  Disp: 30 capsule, Rfl: 11 .  losartan (COZAAR) 50 MG tablet, TAKE 1 TABLET BY MOUTH ONCE DAILY, Disp: 90 tablet, Rfl: 0 .  Multiple Vitamins-Minerals (PRESERVISION AREDS 2 PO), Take 1 tablet by mouth 2 (two) times daily., Disp: , Rfl:  .  omeprazole (PRILOSEC) 20 MG capsule, TAKE 1 CAPSULE BY MOUTH ONCE DAILY, Disp: 90 capsule, Rfl: 3 .  sertraline (ZOLOFT) 50 MG tablet, TAKE 1 TABLET BY MOUTH ONCE DAILY, Disp: 90 tablet, Rfl: 0  Review of Systems  Constitutional: Negative.   Respiratory: Negative.   Cardiovascular: Negative.   Musculoskeletal: Positive for back pain and neck pain.       Leg pain   Neurological: Positive for weakness.    Social History   Tobacco Use  . Smoking status: Never Smoker  . Smokeless tobacco: Never Used  . Tobacco comment: does not smoke  Substance Use Topics  . Alcohol use: Yes    Alcohol/week: 0.0 standard drinks    Comment: rarely - wine      Objective:   BP (!) 148/80 (BP Location: Right Arm, Patient Position: Sitting, Cuff Size: Normal)   Pulse 85   Temp 98.3 F (36.8 C) (Oral)   Wt 182 lb 3.2 oz (82.6 kg)   SpO2 97%   BMI 26.91 kg/m  Vitals:   12/23/18 1041  BP: (!) 148/80  Pulse: 85  Temp: 98.3 F (36.8 C)  TempSrc: Oral  SpO2: 97%  Weight: 182 lb 3.2 oz (82.6 kg)     Physical Exam Vitals signs reviewed.  Constitutional:      General: She is not in acute distress.    Appearance: Normal appearance. She is not diaphoretic.  HENT:     Head: Normocephalic and atraumatic.     Right Ear: Tympanic membrane, ear canal and external ear normal.     Left Ear: Tympanic membrane, ear canal and external ear normal.     Nose: Congestion present.     Mouth/Throat:     Mouth: Mucous membranes are moist.     Pharynx: No oropharyngeal exudate.  Eyes:     General: No scleral icterus.    Conjunctiva/sclera: Conjunctivae normal.     Pupils: Pupils are equal, round, and reactive to light.  Neck:     Musculoskeletal: Neck supple.    Cardiovascular:     Rate and Rhythm: Normal rate and regular rhythm.     Pulses: Normal pulses.     Heart sounds: Normal heart sounds. No murmur.  Pulmonary:     Effort: Pulmonary effort is normal. No respiratory distress.     Breath sounds: Normal breath sounds. No wheezing or rhonchi.  Abdominal:     General: There is no distension.     Palpations: Abdomen is soft.     Tenderness: There is no abdominal tenderness.  Musculoskeletal:     Right lower leg: No edema.     Left lower leg: No edema.     Comments: Back: No midline TTP over spinous processes.  Some R SI joint TTP. Negative SLR bilaterally.  Loss of rotational ROM in neck. Tightness of neck muscles. Dowager hump.  TTP over R greater trochanter and IT band  Lymphadenopathy:     Cervical: No cervical adenopathy.  Skin:    General: Skin is warm and dry.     Capillary Refill: Capillary refill takes less than 2 seconds.     Findings: No rash.  Neurological:     Mental Status: She is alert and oriented to person, place, and time. Mental status is at baseline.     Cranial Nerves: No cranial nerve deficit.     Sensory: No sensory deficit.     Motor: No weakness.     Gait: Gait normal.  Psychiatric:        Mood and Affect: Mood normal.        Behavior: Behavior normal.         Assessment & Plan   1. Neck muscle spasm - chronic and contributes to tension headaches and neck pain - discussed using Baclofen which has helped in the past - heating pad may help as well  2. Trochanteric bursitis of right hip - symptoms c/w trochanteric bursitis - steroid injection preformed INJECTION: Patient was given informed consent. Appropriate time out was taken. Area prepped and draped in usual sterile fashion. 1 cc of depomedrol 40 mg/ml plus  4 cc of 1% lidocaine without epinephrine was injected into the R great trochanteric bursa using a(n) lateral approach. The patient tolerated the procedure well. There were no complications. Post  procedure instructions were given.  - methylPREDNISolone acetate (DEPO-MEDROL) injection 40 mg - lidocaine (PF) (XYLOCAINE) 1 % injection 4 mL  3. Chronic right-sided low back pain with right-sided sciatica - may be contributed by abnormal gait from trochanteric bursitis - baclofen may help as well - will also f/u with Dr Sharlet Salina  4. Dysfunction of right eustachian tube -  discussed importance of flonase use regularly    Meds ordered this encounter  Medications  . baclofen (LIORESAL) 10 MG tablet    Sig: Take 1 tablet (10 mg total) by mouth 3 (three) times daily as needed for muscle spasms.    Dispense:  60 each    Refill:  3  . methylPREDNISolone acetate (DEPO-MEDROL) injection 40 mg  . lidocaine (PF) (XYLOCAINE) 1 % injection 4 mL     Return in about 4 weeks (around 01/20/2019) for CPE.   The entirety of the information documented in the History of Present Illness, Review of Systems and Physical Exam were personally obtained by me. Portions of this information were initially documented by Tiburcio Pea, CMA and reviewed by me for thoroughness and accuracy.    Virginia Crews, MD, MPH [facility] 12/23/2018 1:19 PM

## 2018-12-30 ENCOUNTER — Encounter
Admission: RE | Disposition: A | Discharge status: Home / Self Care | Payer: Self-pay | Source: Home / Self Care | Attending: Gastroenterology

## 2018-12-30 ENCOUNTER — Ambulatory Visit: Payer: PPO | Admitting: Anesthesiology

## 2018-12-30 ENCOUNTER — Ambulatory Visit
Admission: RE | Admit: 2018-12-30 | Discharge: 2018-12-30 | Disposition: A | Discharge status: Home / Self Care | Payer: PPO | Attending: Gastroenterology | Admitting: Gastroenterology

## 2018-12-30 ENCOUNTER — Encounter: Payer: Self-pay | Admitting: Anesthesiology

## 2018-12-30 DIAGNOSIS — Z888 Allergy status to other drugs, medicaments and biological substances status: Secondary | ICD-10-CM | POA: Diagnosis not present

## 2018-12-30 DIAGNOSIS — F329 Major depressive disorder, single episode, unspecified: Secondary | ICD-10-CM | POA: Insufficient documentation

## 2018-12-30 DIAGNOSIS — Z1211 Encounter for screening for malignant neoplasm of colon: Secondary | ICD-10-CM | POA: Diagnosis not present

## 2018-12-30 DIAGNOSIS — Z800 Family history of malignant neoplasm of digestive organs: Secondary | ICD-10-CM | POA: Insufficient documentation

## 2018-12-30 DIAGNOSIS — D124 Benign neoplasm of descending colon: Secondary | ICD-10-CM | POA: Diagnosis not present

## 2018-12-30 DIAGNOSIS — Z86010 Personal history of colonic polyps: Secondary | ICD-10-CM | POA: Insufficient documentation

## 2018-12-30 DIAGNOSIS — F418 Other specified anxiety disorders: Secondary | ICD-10-CM | POA: Diagnosis not present

## 2018-12-30 DIAGNOSIS — Z885 Allergy status to narcotic agent status: Secondary | ICD-10-CM | POA: Diagnosis not present

## 2018-12-30 DIAGNOSIS — Z79899 Other long term (current) drug therapy: Secondary | ICD-10-CM | POA: Insufficient documentation

## 2018-12-30 DIAGNOSIS — D125 Benign neoplasm of sigmoid colon: Secondary | ICD-10-CM | POA: Diagnosis not present

## 2018-12-30 DIAGNOSIS — F419 Anxiety disorder, unspecified: Secondary | ICD-10-CM | POA: Diagnosis not present

## 2018-12-30 DIAGNOSIS — Z880 Allergy status to penicillin: Secondary | ICD-10-CM | POA: Diagnosis not present

## 2018-12-30 DIAGNOSIS — K219 Gastro-esophageal reflux disease without esophagitis: Secondary | ICD-10-CM | POA: Diagnosis not present

## 2018-12-30 DIAGNOSIS — D122 Benign neoplasm of ascending colon: Secondary | ICD-10-CM | POA: Diagnosis not present

## 2018-12-30 DIAGNOSIS — K635 Polyp of colon: Secondary | ICD-10-CM | POA: Diagnosis not present

## 2018-12-30 DIAGNOSIS — D374 Neoplasm of uncertain behavior of colon: Secondary | ICD-10-CM | POA: Diagnosis not present

## 2018-12-30 DIAGNOSIS — I10 Essential (primary) hypertension: Secondary | ICD-10-CM | POA: Insufficient documentation

## 2018-12-30 DIAGNOSIS — D175 Benign lipomatous neoplasm of intra-abdominal organs: Secondary | ICD-10-CM | POA: Insufficient documentation

## 2018-12-30 DIAGNOSIS — E785 Hyperlipidemia, unspecified: Secondary | ICD-10-CM | POA: Diagnosis not present

## 2018-12-30 DIAGNOSIS — K5730 Diverticulosis of large intestine without perforation or abscess without bleeding: Secondary | ICD-10-CM | POA: Insufficient documentation

## 2018-12-30 DIAGNOSIS — Z881 Allergy status to other antibiotic agents status: Secondary | ICD-10-CM | POA: Insufficient documentation

## 2018-12-30 DIAGNOSIS — Z7982 Long term (current) use of aspirin: Secondary | ICD-10-CM | POA: Diagnosis not present

## 2018-12-30 DIAGNOSIS — Z8711 Personal history of peptic ulcer disease: Secondary | ICD-10-CM | POA: Diagnosis not present

## 2018-12-30 DIAGNOSIS — K5790 Diverticulosis of intestine, part unspecified, without perforation or abscess without bleeding: Secondary | ICD-10-CM | POA: Diagnosis not present

## 2018-12-30 HISTORY — PX: COLONOSCOPY WITH PROPOFOL: SHX5780

## 2018-12-30 LAB — HM COLONOSCOPY

## 2018-12-30 SURGERY — COLONOSCOPY WITH PROPOFOL
Anesthesia: General

## 2018-12-30 MED ORDER — SODIUM CHLORIDE 0.9 % IV SOLN
INTRAVENOUS | Status: AC | PRN
  Administered 2018-12-30: .001 mL via INTRAMUSCULAR

## 2018-12-30 MED ORDER — PROPOFOL 500 MG/50ML IV EMUL
INTRAVENOUS | Status: DC | PRN
  Administered 2018-12-30: 120 ug/kg/min via INTRAVENOUS

## 2018-12-30 MED ORDER — EPHEDRINE SULFATE 50 MG/ML IJ SOLN
INTRAMUSCULAR | Status: DC | PRN
  Administered 2018-12-30: 5 mg via INTRAVENOUS

## 2018-12-30 MED ORDER — SODIUM CHLORIDE 0.9 % IV SOLN
INTRAVENOUS | Status: DC
  Administered 2018-12-30: 08:00:00 via INTRAVENOUS

## 2018-12-30 MED ORDER — PROPOFOL 500 MG/50ML IV EMUL
INTRAVENOUS | Status: AC
  Filled 2018-12-30: qty 50

## 2018-12-30 MED ORDER — PROPOFOL 10 MG/ML IV BOLUS
INTRAVENOUS | Status: DC | PRN
  Administered 2018-12-30 (×3): 30 mg via INTRAVENOUS
  Administered 2018-12-30: 50 mg via INTRAVENOUS
  Administered 2018-12-30: 30 mg via INTRAVENOUS
  Administered 2018-12-30: 50 mg via INTRAVENOUS
  Administered 2018-12-30 (×4): 30 mg via INTRAVENOUS

## 2018-12-30 MED ORDER — SPOT INK MARKER SYRINGE KIT
PACK | SUBMUCOSAL | Status: DC | PRN
  Administered 2018-12-30: 2 mL via SUBMUCOSAL

## 2018-12-30 NOTE — Op Note (Signed)
[facility] Gastroenterology Patient Name: Rita Cruz Procedure Date: 12/30/2018 7:37 AM MRN: [id number] Account #: [id number] Date of Birth: [birth year] Admit Type: Outpatient Age: 78 Room: [facility] ENDO ROOM 2 Gender: Female Note Status: Finalized Procedure:            Colonoscopy Indications:          Family history of colon cancer in a first-degree                        relative, Personal history of colonic polyps Providers:            Lollie Sails, MD Referring MD:         Dionne Bucy. Bacigalupo (Referring MD) Medicines:            Monitored Anesthesia Care Complications:        No immediate complications. Procedure:            Pre-Anesthesia Assessment:                       - ASA Grade Assessment: III - A patient with severe                        systemic disease.                       After obtaining informed consent, the colonoscope was                        passed under direct vision. Throughout the procedure,                        the patient's blood pressure, pulse, and oxygen                        saturations were monitored continuously. The                        Colonoscope was introduced through the anus and                        advanced to the the cecum, identified by appendiceal                        orifice and ileocecal valve. The colonoscopy was                        performed without difficulty. The patient tolerated the                        procedure well. The quality of the bowel preparation                        was good. Findings:      A 3 mm polyp was found in the mid sigmoid colon. The polyp was sessile.       The polyp was removed with a cold snare. Resection and retrieval were       complete.      Four sessile polyps were found in the proximal descending colon. The       polyps were 2 to 3 mm in size. These polyps were  removed with a cold       snare. Resection and retrieval were complete.      Two sessile  polyps were found in the proximal descending colon. The       polyps were 1 to 2 mm in size. These polyps were removed with a cold       biopsy forceps. Resection and retrieval were complete.      A 45 mm polyp was found in the proximal ascending colon. The polyp was       carpet-like and sessile. Biopsies were taken with a cold forceps for       histology. Area was tattooed with an injection of 2 mL of Niger ink.      Multiple small-mouthed diverticula were found in the sigmoid colon and       descending colon.      There was a small lipoma, 20 mm in diameter, in the distal ascending       colon, positive pillow sign. Impression:           - One 3 mm polyp in the mid sigmoid colon, removed with                        a cold snare. Resected and retrieved.                       - Four 2 to 3 mm polyps in the proximal descending                        colon, removed with a cold snare. Resected and                        retrieved.                       - Two 1 to 2 mm polyps in the proximal descending                        colon, removed with a cold biopsy forceps. Resected and                        retrieved.                       - One 45 mm polyp in the proximal ascending colon.                        Biopsied. Tattooed.                       - Mild diverticulosis in the sigmoid colon and in the                        descending colon. There was no evidence of diverticular                        bleeding. Recommendation:       - Discharge patient to home.                       - Soft diet today, then advance as tolerated to advance  diet as tolerated.                       - Await pathology results. Procedure Code(s):    --- Professional ---                       [phone], Colonoscopy, flexible; with removal of tumor(s),                        polyp(s), or other lesion(s) by snare technique                       45380, 58, Colonoscopy, flexible; with biopsy, single                         or multiple                       45381, Colonoscopy, flexible; with directed submucosal                        injection(s), any substance Diagnosis Code(s):    --- Professional ---                       D12.5, Benign neoplasm of sigmoid colon                       D12.2, Benign neoplasm of ascending colon                       D12.4, Benign neoplasm of descending colon                       Z80.0, Family history of malignant neoplasm of                        digestive organs                       Z86.010, Personal history of colonic polyps                       K57.30, Diverticulosis of large intestine without                        perforation or abscess without bleeding CPT copyright 2018 American Medical Association. All rights reserved. The codes documented in this report are preliminary and upon coder review may  be revised to meet current compliance requirements. Lollie Sails, MD 12/30/2018 8:47:35 AM This report has been signed electronically. Number of Addenda: 0 Note Initiated On: 12/30/2018 7:37 AM Scope Withdrawal Time: 0 hours 36 minutes 6 seconds  Total Procedure Duration: 0 hours 49 minutes 12 seconds       [facility]

## 2018-12-30 NOTE — H&P (Signed)
Outpatient short stay form Pre-procedure 12/30/2018 7:31 AM Lollie Sails MD  Primary Physician: Miguel Aschoff, MD  Reason for visit: Colonoscopy  History of present illness: Patient is a 78 year old female presenting today for colonoscopy in regards her personal history of colon polyps.  Her last colonoscopy was actually 10/06/2016.  At that time there were an number of tubular adenomas removed.  She is presenting today for reevaluation.  Tolerated her prep well.  She takes no current aspirin or blood thinning agent with the exception of 81 mg aspirin that is been held for couple days.  It is of note patient had a short episode of some left lower quadrant discomfort last week that quickly went away.   No current facility-administered medications for this encounter.   Medications Prior to Admission  Medication Sig Dispense Refill Last Dose  . diltiazem (DILACOR XR) 180 MG 24 hr capsule TAKE ONE CAPSULE BY MOUTH ONCE DAILY 30 capsule 11 12/30/2018 at Unknown time  . losartan (COZAAR) 50 MG tablet TAKE 1 TABLET BY MOUTH ONCE DAILY 90 tablet 0 12/30/2018 at Unknown time  . ALPRAZolam (XANAX) 0.5 MG tablet TAKE 1 TABLET BY MOUTH EVERY 12 HOURS AS NEEDED 60 tablet 3 Taking  . aspirin (ASPIRIN EC LO-DOSE) 81 MG EC tablet Take 81 mg by mouth daily.    Taking  . baclofen (LIORESAL) 10 MG tablet Take 1 tablet (10 mg total) by mouth 3 (three) times daily as needed for muscle spasms. 60 each 3   . Multiple Vitamins-Minerals (PRESERVISION AREDS 2 PO) Take 1 tablet by mouth 2 (two) times daily.   Taking  . omeprazole (PRILOSEC) 20 MG capsule TAKE 1 CAPSULE BY MOUTH ONCE DAILY 90 capsule 3 Taking  . sertraline (ZOLOFT) 50 MG tablet TAKE 1 TABLET BY MOUTH ONCE DAILY 90 tablet 0 Taking     Allergies  Allergen Reactions  . Percocet  [Oxycodone-Acetaminophen] Other (See Comments)    Hallucinations Hallucinations  . Levaquin [Levofloxacin In D5w] Diarrhea    Vaginal itching  . Lisinopril     cough  .  Meloxicam     Upset stomach  . Penicillins Itching  . Preservision Areds 2 [Multiple Vitamins-Minerals] Nausea Only     Past Medical History:  Diagnosis Date  . Adenomatous polyps   . Anxiety   . Arthritis    neck  . Capsulitis of toe of right foot   . Chest pain    Adenosine Myoview in 2006 done for atypical chest pain, showed no eviden e for ischemia or infarct  . Colon polyps   . GERD (gastroesophageal reflux disease)   . History of colonic polyps   . HTN (hypertension)   . Hyperlipidemia   . PUD (peptic ulcer disease)    with hx of H. Pylori    Review of systems:      Physical Exam    Heart and lungs: Regular rate and rhythm without rub or gallop, lungs are bilaterally clear.    HEENT: Normocephalic atraumatic anicteric    Other:    Pertinant exam for procedure: Soft nontender nondistended bowel sounds positive normoactive.    Planned proceedures: Colonoscopy and indicated procedures. I have discussed the risks benefits and complications of procedures to include not limited to bleeding, infection, perforation and the risk of sedation and the patient wishes to proceed.    Lollie Sails, MD Gastroenterology 12/30/2018  7:31 AM

## 2018-12-30 NOTE — Anesthesia Preprocedure Evaluation (Signed)
Anesthesia Evaluation  Patient identified by MRN, date of birth, ID band Patient awake    Reviewed: Allergy & Precautions, H&P , NPO status , Patient's Chart, lab work & pertinent test results, reviewed documented beta blocker date and time   Airway Mallampati: II   Neck ROM: full    Dental  (+) Poor Dentition   Pulmonary neg pulmonary ROS,    Pulmonary exam normal        Cardiovascular Exercise Tolerance: Good hypertension, On Medications negative cardio ROS Normal cardiovascular exam Rhythm:regular Rate:Normal     Neuro/Psych PSYCHIATRIC DISORDERS Anxiety Depression negative neurological ROS     GI/Hepatic Neg liver ROS, PUD, GERD  ,  Endo/Other  negative endocrine ROS  Renal/GU negative Renal ROS  negative genitourinary   Musculoskeletal   Abdominal   Peds  Hematology negative hematology ROS (+)   Anesthesia Other Findings Past Medical History: No date: Adenomatous polyps No date: Anxiety No date: Arthritis     Comment:  neck No date: Capsulitis of toe of right foot No date: Chest pain     Comment:  Adenosine Myoview in 2006 done for atypical chest pain,               showed no eviden e for ischemia or infarct No date: Colon polyps No date: GERD (gastroesophageal reflux disease) No date: History of colonic polyps No date: HTN (hypertension) No date: Hyperlipidemia No date: PUD (peptic ulcer disease)     Comment:  with hx of H. Pylori Past Surgical History: No date: ABDOMINAL HYSTERECTOMY 2011: BREAST BIOPSY; Right     Comment:  benign mass done in Dr. Curly [place] office 06/29/2015: BREAST BIOPSY; Right     Comment:  top hat marker. PROLIFERATIVE FIBROCYSTIC CHANGE WITH               ASSOCIATED  No date: CERVICAL DISCECTOMY No date: CHOLECYSTECTOMY 10/06/2016: COLONOSCOPY WITH PROPOFOL; N/A     Comment:  Procedure: COLONOSCOPY WITH PROPOFOL;  Surgeon: Lollie Sails, MD;  Location:  [facility] ENDOSCOPY;  Service:               Endoscopy;  Laterality: N/A; No date: EYE SURGERY No date: hysterectomy-unspecified area BMI    Body Mass Index:  25.84 kg/m     Reproductive/Obstetrics negative OB ROS                             Anesthesia Physical Anesthesia Plan  ASA: III  Anesthesia Plan: General   Post-op Pain Management:    Induction:   PONV Risk Score and Plan:   Airway Management Planned:   Additional Equipment:   Intra-op Plan:   Post-operative Plan:   Informed Consent: I have reviewed the patients History and Physical, chart, labs and discussed the procedure including the risks, benefits and alternatives for the proposed anesthesia with the patient or authorized representative who has indicated his/her understanding and acceptance.     Dental Advisory Given  Plan Discussed with: CRNA  Anesthesia Plan Comments:         Anesthesia Quick Evaluation

## 2018-12-30 NOTE — Anesthesia Post-op Follow-up Note (Signed)
Anesthesia QCDR form completed.        

## 2018-12-30 NOTE — Transfer of Care (Signed)
Immediate Anesthesia Transfer of Care Note  Patient: Rita Cruz  Procedure(s) Performed: COLONOSCOPY WITH PROPOFOL (N/A )  Patient Location: PACU and Endoscopy Unit  Anesthesia Type:General  Level of Consciousness: drowsy and responds to stimulation  Airway & Oxygen Therapy: Patient Spontanous Breathing and Patient connected to nasal cannula oxygen  Post-op Assessment: Report given to RN and Post -op Vital signs reviewed and stable  Post vital signs: Reviewed and stable  Last Vitals:  Vitals Value Taken Time  BP 100/52 12/30/2018  8:42 AM  Temp 36.3 C 12/30/2018  8:41 AM  Pulse 70 12/30/2018  8:44 AM  Resp 27 12/30/2018  8:44 AM  SpO2 99 % 12/30/2018  8:44 AM  Vitals shown include unvalidated device data.  Last Pain:  Vitals:   12/30/18 0841  TempSrc: Tympanic  PainSc: 0-No pain         Complications: No apparent anesthesia complications

## 2018-12-31 NOTE — Anesthesia Postprocedure Evaluation (Signed)
Anesthesia Post Note  Patient: Rita Cruz  Procedure(s) Performed: COLONOSCOPY WITH PROPOFOL (N/A )  Patient location during evaluation: PACU Anesthesia Type: General Level of consciousness: awake and alert Pain management: pain level controlled Vital Signs Assessment: post-procedure vital signs reviewed and stable Respiratory status: spontaneous breathing, nonlabored ventilation, respiratory function stable and patient connected to nasal cannula oxygen Cardiovascular status: blood pressure returned to baseline and stable Postop Assessment: no apparent nausea or vomiting Anesthetic complications: no     Last Vitals:  Vitals:   12/30/18 0841 12/30/18 0843  BP: (!) 100/52 110/60  Pulse: (!) 58   Resp: 16   Temp: (!) 36.3 C   SpO2: 98%     Last Pain:  Vitals:   12/30/18 0923  TempSrc:   PainSc: 0-No pain                 Molli Barrows

## 2019-01-01 ENCOUNTER — Other Ambulatory Visit: Payer: Self-pay | Admitting: Family Medicine

## 2019-01-02 ENCOUNTER — Encounter: Payer: Self-pay | Admitting: Gastroenterology

## 2019-01-02 LAB — SURGICAL PATHOLOGY

## 2019-01-17 ENCOUNTER — Other Ambulatory Visit: Payer: Self-pay

## 2019-01-17 ENCOUNTER — Ambulatory Visit (INDEPENDENT_AMBULATORY_CARE_PROVIDER_SITE_OTHER): Payer: PPO | Admitting: General Surgery

## 2019-01-17 ENCOUNTER — Encounter: Payer: Self-pay | Admitting: General Surgery

## 2019-01-17 VITALS — BP 172/84 | HR 82 | Temp 97.7°F | Resp 16 | Ht 69.0 in | Wt 176.0 lb

## 2019-01-17 DIAGNOSIS — D122 Benign neoplasm of ascending colon: Secondary | ICD-10-CM | POA: Diagnosis not present

## 2019-01-17 MED ORDER — METOCLOPRAMIDE HCL 5 MG PO TABS: 5.0000 mg | ORAL_TABLET | Freq: Four times a day (QID) | ORAL | 0 refills | Status: DC | PRN

## 2019-01-17 MED ORDER — METRONIDAZOLE 500 MG PO TABS: ORAL_TABLET | ORAL | 0 refills | Status: DC

## 2019-01-17 MED ORDER — POLYETHYLENE GLYCOL 3350 17 GM/SCOOP PO POWD: ORAL | 0 refills | Status: DC

## 2019-01-17 MED ORDER — NEOMYCIN SULFATE 500 MG PO TABS: ORAL_TABLET | ORAL | 0 refills | Status: DC

## 2019-01-17 NOTE — Progress Notes (Signed)
Patient ID: Rita Cruz, female   DOB: 21-[name]-1941, 78 y.o.   MRN: [id number]  Chief Complaint  Patient presents with  . Colon Polyps    ref Dr.Skulskie unremoval polyp found in colonoscpoy    HPI Rita Cruz is a 78 y.o. female.  Here for evaluation of a colon polyp referred by Dr Donnella Sham. Her colonoscopy was 12-30-18. She is here with her husband, [name] and daughter, Suanne Marker.  HPI  Past Medical History:  Diagnosis Date  . Adenomatous polyps   . Anxiety   . Arthritis    neck  . Capsulitis of toe of right foot   . Chest pain    Adenosine Myoview in 2006 done for atypical chest pain, showed no eviden e for ischemia or infarct  . Colon polyps   . GERD (gastroesophageal reflux disease)   . History of colonic polyps   . HTN (hypertension)   . Hyperlipidemia   . PUD (peptic ulcer disease)    with hx of H. Pylori    Past Surgical History:  Procedure Laterality Date  . ABDOMINAL HYSTERECTOMY    . BREAST BIOPSY Right 2011   benign mass done in Dr. Curly [place] office  . BREAST BIOPSY Right 06/29/2015   top hat marker. PROLIFERATIVE FIBROCYSTIC CHANGE WITH ASSOCIATED   . CERVICAL DISCECTOMY    . CHOLECYSTECTOMY    . COLONOSCOPY WITH PROPOFOL N/A 10/06/2016   Procedure: COLONOSCOPY WITH PROPOFOL;  Surgeon: Lollie Sails, MD;  Location: [facility] ENDOSCOPY;  Service: Endoscopy;  Laterality: N/A;  . COLONOSCOPY WITH PROPOFOL N/A 12/30/2018   Procedure: COLONOSCOPY WITH PROPOFOL;  Surgeon: Lollie Sails, MD;  Location: [facility] ENDOSCOPY;  Service: Endoscopy;  Laterality: N/A;  . EYE SURGERY    . hysterectomy-unspecified area      Family History  Problem Relation Age of Onset  . Kidney failure Father   . Heart failure Father   . Diabetes Father   . COPD Mother   . Colon cancer Mother 64  . Heart attack Mother   . Ulcers Mother   . Osteoporosis Mother   . Macular degeneration Mother   . Hypertension Brother   . Irritable bowel syndrome Daughter   . Colon polyps  Daughter   . Kidney Stones Son   . Bipolar disorder Son   . Melanoma Son        stage IV  . Hypertension Son   . Kidney cancer Other     Social History Social History   Tobacco Use  . Smoking status: Never Smoker  . Smokeless tobacco: Never Used  . Tobacco comment: does not smoke  Substance Use Topics  . Alcohol use: Yes    Alcohol/week: 0.0 standard drinks    Comment: rarely - wine  . Drug use: No    Allergies  Allergen Reactions  . Percocet  [Oxycodone-Acetaminophen] Other (See Comments)    Hallucinations Hallucinations  . Levaquin [Levofloxacin In D5w] Diarrhea    Vaginal itching  . Lisinopril     cough  . Meloxicam     Upset stomach  . Penicillins Itching  . Preservision Areds 2 [Multiple Vitamins-Minerals] Nausea Only    Current Outpatient Medications  Medication Sig Dispense Refill  . ALPRAZolam (XANAX) 0.5 MG tablet TAKE 1 TABLET BY MOUTH EVERY 12 HOURS AS NEEDED (Patient taking differently: Take 0.5 mg by mouth 2 (two) times daily. ) 60 tablet 3  . aspirin (ASPIRIN EC LO-DOSE) 81 MG EC tablet Take 81 mg by mouth at  bedtime.     . baclofen (LIORESAL) 10 MG tablet Take 1 tablet (10 mg total) by mouth 3 (three) times daily as needed for muscle spasms. 60 each 3  . DILT-XR 180 MG 24 hr capsule TAKE 1 CAPSULE BY MOUTH ONCE DAILY (Patient taking differently: Take 180 mg by mouth daily. ) 90 capsule 3  . losartan (COZAAR) 50 MG tablet TAKE 1 TABLET BY MOUTH ONCE DAILY (Patient taking differently: Take 50 mg by mouth daily. ) 90 tablet 0  . omeprazole (PRILOSEC) 20 MG capsule TAKE 1 CAPSULE BY MOUTH ONCE DAILY (Patient taking differently: Take 20 mg by mouth daily. ) 90 capsule 3  . sertraline (ZOLOFT) 50 MG tablet TAKE 1 TABLET BY MOUTH ONCE DAILY (Patient taking differently: Take 50 mg by mouth at bedtime. ) 90 tablet 0  . acetaminophen (TYLENOL) 325 MG tablet Take 650 mg by mouth every 6 (six) hours as needed for moderate pain.    . fluticasone (FLONASE) 50 MCG/ACT  nasal spray Place 2 sprays into both nostrils daily as needed for allergies.    Marland Kitchen ketotifen (ZADITOR) 0.025 % ophthalmic solution Place 1 drop into both eyes daily as needed (allergies).    . metoCLOPramide (REGLAN) 5 MG tablet Take 1 tablet (5 mg total) by mouth every 6 (six) hours as needed for nausea. Take your first dose 30 minutes prior to staring your Miralax prep 2 tablet 0  . metroNIDAZOLE (FLAGYL) 500 MG tablet Take 1 tablet at 6 pm and take 1 tablet at 11 pm the evening prior to surgery. 2 tablet 0  . Multiple Vitamins-Minerals (PRESERVISION AREDS 2 PO) Take 1 tablet by mouth 2 (two) times daily.    Marland Kitchen neomycin (MYCIFRADIN) 500 MG tablet Take 2 tablets at 6 pm, then take 2 tablets at 11 pm the evening prior to surgery. 4 tablet 0  . polyethylene glycol powder (GLYCOLAX/MIRALAX) powder Mix whole container with 64 ounces of clear liquids, No Red liquids. 255 g 0   No current facility-administered medications for this visit.     Review of Systems Review of Systems  Constitutional: Negative.   HENT: Negative.   Eyes: Negative.   Respiratory: Negative.   Cardiovascular: Negative.   Gastrointestinal: Negative.  Negative for constipation and diarrhea.  Endocrine: Negative.   Genitourinary: Negative.   Musculoskeletal: Negative.   Allergic/Immunologic: Negative.   Neurological: Negative.   Hematological: Negative.   Psychiatric/Behavioral: Negative.     Blood pressure (!) 172/84, pulse 82, temperature 97.7 F (36.5 C), temperature source Skin, resp. rate 16, height 5\' 9"  (1.753 m), weight 176 lb (79.8 kg), SpO2 97 %.  Physical Exam Physical Exam Constitutional:      Appearance: Normal appearance.  HENT:     Mouth/Throat:     Pharynx: No oropharyngeal exudate.  Eyes:     General: No scleral icterus.    Conjunctiva/sclera: Conjunctivae normal.  Neck:     Musculoskeletal: Neck supple.  Cardiovascular:     Rate and Rhythm: Normal rate and regular rhythm.     Pulses: Normal  pulses.          Dorsalis pedis pulses are 2+ on the right side and 2+ on the left side.       Posterior tibial pulses are 2+ on the right side and 2+ on the left side.     Heart sounds: Normal heart sounds.  Pulmonary:     Effort: Pulmonary effort is normal.     Breath sounds: Normal breath sounds.  Abdominal:     General: Bowel sounds are normal.     Palpations: Abdomen is soft.  Musculoskeletal: Normal range of motion.     Right lower leg: No edema.     Left lower leg: No edema.  Lymphadenopathy:     Upper Body:     Left upper body: No supraclavicular adenopathy.     Lower Body: No right inguinal adenopathy. No left inguinal adenopathy.  Skin:    General: Skin is warm and dry.  Neurological:     Mental Status: She is alert and oriented to person, place, and time.  Psychiatric:        Mood and Affect: Mood normal.        Behavior: Behavior normal.     Data Reviewed Colonoscopy of December 30, 2018 reviewed in person with Dr. Gustavo Lah prior to today's visit.  There is a large 45 mm polyp in the proximal ascending colon.  Multiple small polyps were noted throughout the colon especially on the descending side.  DIAGNOSIS:  A. COLON POLYP, MID SIGMOID; COLD SNARE:  - TUBULAR ADENOMA.  - NEGATIVE FOR HIGH-GRADE DYSPLASIA AND MALIGNANCY.   B. COLON POLYPS X 4, PROXIMAL DESCENDING; COLD SNARE:  - TUBULAR ADENOMAS, 6 FRAGMENTS.  - NEGATIVE FOR HIGH-GRADE DYSPLASIA AND MALIGNANCY.   C. COLON POLYP, PROXIMAL ASCENDING; COLD BIOPSY:  - TUBULOVILLOUS ADENOMA, MULTIPLE FRAGMENTS.  - NEGATIVE FOR HIGH-GRADE DYSPLASIA AND MALIGNANCY, SEE COMMENT BELOW.   D. COLON POLYP X 2, PROXIMAL DESCENDING; COLD BIOPSY:  - TUBULAR ADENOMA, NEGATIVE FOR HIGH-GRADE DYSPLASIA AND MALIGNANCY.  - HYPERPLASTIC POLYP, NEGATIVE FOR DYSPLASIA AND MALIGNANCY.   Colonoscopy completed October 06, 2016 and shown multiple small polyps from the cecum through the distal transverse colon.  Pathology and  all of these lesion showed tubular adenomas.  None of these sites appeared to correlate with the present location of the large tubulovillous polyp which is developed in the last 2-1/2 years.   CBC of January 18, 2017 showed a hemoglobin of 12.4 with an MCV of 88, white blood cell count of 6400, platelet count of 241,000.  Normal differential. Comprehensive metabolic panel the same date showed a creatinine of 1.0 with an estimated GFR 56.  Normal electrolytes.  Assessment.  Large polyp of the ascending colon, tubulovillous histology on biopsy.  Candidate for right hemicolectomy.  Plan  The procedure was reviewed in detail with the patient, her husband and daughter.  She has previously undergone a laparoscopic cholecystectomy but hopefully there will be few adhesions from that procedure.  Anticipated hospital stay three 1-day.  Importance of early ambulation reviewed.  She reported some nausea with her last bowel prep for the colonoscopy earlier this month.  We will have her make use of Reglan 5 mg prior to beginning the prep to hopefully minimize recurrent symptoms.  The patient's husband is undergoing evaluation for some lung and liver nodules recently identified.  She is looking to time surgery so as not to impact his care.  She is aware that she will be able to drive for approximately 2 weeks, or until she is pain-free, which ever comes first.  HPI, assessment, plan and physical exam has been scribed under the direction and in the presence of Robert Bellow, MD. Karie Fetch, RN  I have completed the exam and reviewed the above documentation for accuracy and completeness.  I agree with the above.  Haematologist has been used and any errors in dictation or transcription are unintentional.  Dellis Filbert  Bary Castilla, M.D., F.A.C.S.  The patient is scheduled for surgery at [facility] with Dr Bary Castilla on 01/23/19. She will pre admit at the hospital prior. She will start her prep on 01/22/19 and will  have nothing to drink but water after midnight. Miralax, Reglan, and antibiotic prescriptions have been sent into the patient's pharmacy. The patient is aware of date and instructions.  Documented by Caryl-Lyn Otis Brace LPN  Forest Gleason Tabitha Tupper 01/18/2019, 10:39 AM

## 2019-01-17 NOTE — Patient Instructions (Addendum)
The patient is aware to call back for any questions or new concerns. Schedule surgery  The patient is scheduled for surgery at [facility] with Dr Bary Castilla on 01/23/19. She will pre admit at the hospital prior. She will start her prep on 01/22/19 and will have nothing to drink but water after midnight. Miralax, Reglan, and antibiotic prescriptions have been sent into the patient's pharmacy. The patient is aware of date and instructions.

## 2019-01-18 ENCOUNTER — Telehealth: Payer: Self-pay

## 2019-01-18 DIAGNOSIS — D122 Benign neoplasm of ascending colon: Secondary | ICD-10-CM | POA: Insufficient documentation

## 2019-01-18 NOTE — Telephone Encounter (Signed)
The patient is scheduled for pre admit testing at the hospital on 01/19/19 at 7:30 am in the [facility]. She is aware of date, time, and instructions.

## 2019-01-19 ENCOUNTER — Other Ambulatory Visit: Payer: Self-pay

## 2019-01-19 ENCOUNTER — Encounter
Admission: RE | Admit: 2019-01-19 | Discharge: 2019-01-19 | Disposition: A | Discharge status: Home / Self Care | Payer: PPO | Source: Ambulatory Visit | Attending: General Surgery | Admitting: General Surgery

## 2019-01-19 DIAGNOSIS — Z8619 Personal history of other infectious and parasitic diseases: Secondary | ICD-10-CM | POA: Diagnosis not present

## 2019-01-19 DIAGNOSIS — Z8379 Family history of other diseases of the digestive system: Secondary | ICD-10-CM | POA: Diagnosis not present

## 2019-01-19 DIAGNOSIS — D126 Benign neoplasm of colon, unspecified: Secondary | ICD-10-CM | POA: Diagnosis not present

## 2019-01-19 DIAGNOSIS — Z880 Allergy status to penicillin: Secondary | ICD-10-CM | POA: Diagnosis not present

## 2019-01-19 DIAGNOSIS — R001 Bradycardia, unspecified: Secondary | ICD-10-CM | POA: Diagnosis not present

## 2019-01-19 DIAGNOSIS — Z888 Allergy status to other drugs, medicaments and biological substances status: Secondary | ICD-10-CM | POA: Diagnosis not present

## 2019-01-19 DIAGNOSIS — Z885 Allergy status to narcotic agent status: Secondary | ICD-10-CM | POA: Diagnosis not present

## 2019-01-19 DIAGNOSIS — Z79899 Other long term (current) drug therapy: Secondary | ICD-10-CM | POA: Diagnosis not present

## 2019-01-19 DIAGNOSIS — Z01818 Encounter for other preprocedural examination: Secondary | ICD-10-CM | POA: Diagnosis present

## 2019-01-19 DIAGNOSIS — I10 Essential (primary) hypertension: Secondary | ICD-10-CM | POA: Insufficient documentation

## 2019-01-19 DIAGNOSIS — Z8719 Personal history of other diseases of the digestive system: Secondary | ICD-10-CM | POA: Diagnosis not present

## 2019-01-19 DIAGNOSIS — D120 Benign neoplasm of cecum: Secondary | ICD-10-CM | POA: Diagnosis not present

## 2019-01-19 DIAGNOSIS — E7800 Pure hypercholesterolemia, unspecified: Secondary | ICD-10-CM | POA: Diagnosis not present

## 2019-01-19 DIAGNOSIS — Z0000 Encounter for general adult medical examination without abnormal findings: Secondary | ICD-10-CM | POA: Diagnosis not present

## 2019-01-19 DIAGNOSIS — Z8249 Family history of ischemic heart disease and other diseases of the circulatory system: Secondary | ICD-10-CM | POA: Diagnosis not present

## 2019-01-19 DIAGNOSIS — Z7951 Long term (current) use of inhaled steroids: Secondary | ICD-10-CM | POA: Diagnosis not present

## 2019-01-19 DIAGNOSIS — Z841 Family history of disorders of kidney and ureter: Secondary | ICD-10-CM | POA: Diagnosis not present

## 2019-01-19 DIAGNOSIS — K660 Peritoneal adhesions (postprocedural) (postinfection): Secondary | ICD-10-CM | POA: Diagnosis present

## 2019-01-19 DIAGNOSIS — Z8711 Personal history of peptic ulcer disease: Secondary | ICD-10-CM | POA: Diagnosis not present

## 2019-01-19 DIAGNOSIS — Z825 Family history of asthma and other chronic lower respiratory diseases: Secondary | ICD-10-CM | POA: Diagnosis not present

## 2019-01-19 DIAGNOSIS — Z8051 Family history of malignant neoplasm of kidney: Secondary | ICD-10-CM | POA: Diagnosis not present

## 2019-01-19 DIAGNOSIS — F419 Anxiety disorder, unspecified: Secondary | ICD-10-CM | POA: Diagnosis present

## 2019-01-19 DIAGNOSIS — Z9049 Acquired absence of other specified parts of digestive tract: Secondary | ICD-10-CM | POA: Diagnosis not present

## 2019-01-19 DIAGNOSIS — Z7982 Long term (current) use of aspirin: Secondary | ICD-10-CM | POA: Diagnosis not present

## 2019-01-19 DIAGNOSIS — Z8371 Family history of colonic polyps: Secondary | ICD-10-CM | POA: Diagnosis not present

## 2019-01-19 DIAGNOSIS — K219 Gastro-esophageal reflux disease without esophagitis: Secondary | ICD-10-CM | POA: Diagnosis present

## 2019-01-19 DIAGNOSIS — R739 Hyperglycemia, unspecified: Secondary | ICD-10-CM | POA: Diagnosis not present

## 2019-01-19 DIAGNOSIS — Z800 Family history of malignant neoplasm of digestive organs: Secondary | ICD-10-CM | POA: Diagnosis not present

## 2019-01-19 DIAGNOSIS — K635 Polyp of colon: Secondary | ICD-10-CM | POA: Diagnosis not present

## 2019-01-19 DIAGNOSIS — E785 Hyperlipidemia, unspecified: Secondary | ICD-10-CM | POA: Diagnosis not present

## 2019-01-19 DIAGNOSIS — D122 Benign neoplasm of ascending colon: Secondary | ICD-10-CM | POA: Diagnosis present

## 2019-01-19 DIAGNOSIS — Z881 Allergy status to other antibiotic agents status: Secondary | ICD-10-CM | POA: Diagnosis not present

## 2019-01-19 DIAGNOSIS — Z833 Family history of diabetes mellitus: Secondary | ICD-10-CM | POA: Diagnosis not present

## 2019-01-19 LAB — CBC WITH DIFFERENTIAL/PLATELET
Abs Immature Granulocytes: 0.02 10*3/uL (ref 0.00–0.07)
Basophils Absolute: 0 10*3/uL (ref 0.0–0.1)
Basophils Relative: 0 %
Eosinophils Absolute: 0.1 10*3/uL (ref 0.0–0.5)
Eosinophils Relative: 2 %
HCT: 37.9 % (ref 36.0–46.0)
Hemoglobin: 12.3 g/dL (ref 12.0–15.0)
Immature Granulocytes: 0 %
Lymphocytes Relative: 17 %
Lymphs Abs: 1.3 10*3/uL (ref 0.7–4.0)
MCH: 28.4 pg (ref 26.0–34.0)
MCHC: 32.5 g/dL (ref 30.0–36.0)
MCV: 87.5 fL (ref 80.0–100.0)
Monocytes Absolute: 0.5 10*3/uL (ref 0.1–1.0)
Monocytes Relative: 6 %
Neutro Abs: 5.8 10*3/uL (ref 1.7–7.7)
Neutrophils Relative %: 75 %
Platelets: 250 10*3/uL (ref 150–400)
RBC: 4.33 MIL/uL (ref 3.87–5.11)
RDW: 12.9 % (ref 11.5–15.5)
WBC: 7.7 10*3/uL (ref 4.0–10.5)
nRBC: 0 % (ref 0.0–0.2)

## 2019-01-19 LAB — TYPE AND SCREEN
ABO/RH(D): B POS
Antibody Screen: NEGATIVE

## 2019-01-19 LAB — BASIC METABOLIC PANEL
Anion gap: 9 (ref 5–15)
BUN: 12 mg/dL (ref 8–23)
CO2: 23 mmol/L (ref 22–32)
Calcium: 9 mg/dL (ref 8.9–10.3)
Chloride: 107 mmol/L (ref 98–111)
Creatinine, Ser: 0.81 mg/dL (ref 0.44–1.00)
GFR calc Af Amer: >60 mL/min (ref >60)
GFR calc non Af Amer: >60 mL/min (ref >60)
Glucose, Bld: 100 mg/dL — ABNORMAL HIGH (ref 70–99)
Potassium: 3.6 mmol/L (ref 3.5–5.1)
Sodium: 139 mmol/L (ref 135–145)

## 2019-01-19 NOTE — Patient Instructions (Signed)
  Your procedure is scheduled on: Monday January 23, 2019  Report to Same Day Surgery 2nd floor [facility] Entrance-take elevator on left to 2nd floor.  Check in with surgery information desk.) To find out your arrival time, call [phone] 1:00-3:00 PM on Friday January 20, 2019  Remember: Instructions that are not followed completely may result in serious medical risk, up to and including death, or upon the discretion of your surgeon and anesthesiologist your surgery may need to be rescheduled.    __x__ 1. Do not eat food (including mints, candies, chewing gum) after midnight the night before your procedure. You may drink clear liquids up to 2 hours before you are scheduled to arrive at the hospital for your procedure.  Do not drink anything within 2 hours of your scheduled arrival to the hospital.  Approved clear liquids:  --Water or Apple juice without pulp  --Clear carbohydrate beverage such as Gatorade or Powerade  --Black Coffee or Clear Tea (No milk, no creamers, do not add anything to the coffee or tea)    __x__ 2. No Alcohol for 24 hours before or after surgery.   __x__ 3. No Smoking or e-cigarettes for 24 hours before surgery.  Do not use any chewable tobacco products for at least 6 hours before surgery.   __x__ 4. Notify your doctor if there is any change in your medical condition (cold, fever, infections).   __x__ 5. On the morning of surgery brush your teeth with toothpaste and water.  You may rinse your mouth with mouthwash if you wish.  Do not swallow any toothpaste or mouthwash.  Please read over the following fact sheets that you were given:   [facility] Preparing for Surgery and/or MRSA Information    __x__ Use CHG Soap or Sage wipes as directed on instruction sheet   Do not wear jewelry, make-up, hairpins, clips or nail polish on the day of surgery.  Do not wear lotions, powders, deodorant, or perfumes.   Do not shave below the face/neck 48 hours  prior to surgery.   Do not bring valuables to the hospital.    [facility] is not responsible for any belongings or valuables.               Contacts, dentures or bridgework may not be worn into surgery.  Leave your suitcase in the car. After surgery it may be brought to your room.  For patients admitted to the hospital, discharge time is determined by your treatment team.  __x__ Take these medicines with a small sip of water on the morning of surgery   1. Diltiazem  2. Prilosec  3. Xanax and Tylenol if needed   Skip your Losartan the morning of surgery  __x__ Follow recommendations from Cardiologist, Pulmonologist or PCP regarding stopping Aspirin, Coumadin, Plavix, Eliquis, Effient, Pradaxa, and Pletal.  __x__ TODAY: Stop Anti-inflammatories such as Advil, Ibuprofen, Motrin, Aleve, Naproxen, Naprosyn, BC/Goodies powders or aspirin products. You may continue to take Tylenol and Celebrex.   __x__ TODAY: Stop supplements until after surgery. You may continue to take Vitamin D, Vitamin B, and multivitamin.

## 2019-01-19 NOTE — Pre-Procedure Instructions (Signed)
EKG OK BY DR FITZGERALD 

## 2019-01-20 ENCOUNTER — Ambulatory Visit (INDEPENDENT_AMBULATORY_CARE_PROVIDER_SITE_OTHER): Payer: PPO | Admitting: Family Medicine

## 2019-01-20 ENCOUNTER — Encounter: Payer: Self-pay | Admitting: Family Medicine

## 2019-01-20 VITALS — BP 159/74 | HR 67 | Temp 98.3°F | Ht 69.0 in | Wt 176.6 lb

## 2019-01-20 DIAGNOSIS — Z0000 Encounter for general adult medical examination without abnormal findings: Secondary | ICD-10-CM | POA: Diagnosis not present

## 2019-01-20 DIAGNOSIS — R739 Hyperglycemia, unspecified: Secondary | ICD-10-CM

## 2019-01-20 DIAGNOSIS — E7800 Pure hypercholesterolemia, unspecified: Secondary | ICD-10-CM

## 2019-01-20 DIAGNOSIS — I10 Essential (primary) hypertension: Secondary | ICD-10-CM

## 2019-01-20 LAB — LIPID PANEL
Cholesterol: 182 mg/dL (ref 0–200)
HDL: 41 mg/dL (ref >40)
LDL Cholesterol: 114 mg/dL — ABNORMAL HIGH (ref 0–99)
Total CHOL/HDL Ratio: 4.4 RATIO
Triglycerides: 136 mg/dL (ref <150)
VLDL: 27 mg/dL (ref 0–40)

## 2019-01-20 NOTE — Progress Notes (Signed)
Patient: Rita Cruz, Female    DOB: [birth year], 78 y.o.   MRN: [id number] Visit Date: 01/21/2019  Today's Provider: [name], MD   Chief Complaint  Patient presents with  . Annual Exam   Subjective:     Patient had a AWE with McKenzie on 12/20/2018   Complete Physical Rita Cruz is a 78 y.o. female. She feels well. She reports exercising none. She reports she is sleeping well.  Another provider that has known the patient for many years was concerned for possible dementia as she did not seem to be acting like herself lately when he saw her with her husband.  MOCA testing done today and she scored 30/30 easily.  Last colonoscopy showed large polyp and patient will be undergoing L partial colectomy early next week. She is nervous about this and about taking care of everything that needs to be done ahead of time. -----------------------------------------------------------   Review of Systems  Constitutional: Positive for diaphoresis and fatigue.       Irritability  HENT: Positive for postnasal drip.   Eyes: Positive for photophobia and itching.  Respiratory: Positive for shortness of breath (Breathlessness).   Cardiovascular: Positive for palpitations.  Gastrointestinal: Negative.   Endocrine: Negative.   Genitourinary: Positive for dysuria.  Musculoskeletal: Positive for back pain, myalgias and neck stiffness.  Skin: Negative.   Allergic/Immunologic: Negative.   Neurological: Negative.   Hematological: Negative.   Psychiatric/Behavioral: Positive for agitation. The patient is nervous/anxious.     Social History   Socioeconomic History  . Marital status: Married    Spouse name: Not on file  . Number of children: 2  . Years of education: Not on file  . Highest education level: Some college, no degree  Occupational History  . Occupation: retired  Scientific laboratory technician  . Financial resource strain: Not hard at all  . Food insecurity:   Worry: Never true    Inability: Never true  . Transportation needs:    Medical: No    Non-medical: No  Tobacco Use  . Smoking status: Never Smoker  . Smokeless tobacco: Never Used  . Tobacco comment: does not smoke  Substance and Sexual Activity  . Alcohol use: Yes    Alcohol/week: 0.0 standard drinks    Comment: rarely - wine  . Drug use: No  . Sexual activity: Not on file  Lifestyle  . Physical activity:    Days per week: 0 days    Minutes per session: 0 min  . Stress: Very much  Relationships  . Social connections:    Talks on phone: Patient refused    Gets together: Patient refused    Attends religious service: Patient refused    Active member of club or organization: Patient refused    Attends meetings of clubs or organizations: Patient refused    Relationship status: Patient refused  . Intimate partner violence:    Fear of current or ex partner: No    Emotionally abused: No    Physically abused: No    Forced sexual activity: No  Other Topics Concern  . Not on file  Social History Narrative   Married-2nd marriage. She has 2 children and her husband has 2 children.     Past Medical History:  Diagnosis Date  . Adenomatous polyps   . Anxiety   . Arthritis    neck  . Capsulitis of toe of right foot   . Chest pain    Adenosine Myoview in  2006 done for atypical chest pain, showed no eviden e for ischemia or infarct  . Colon polyps   . GERD (gastroesophageal reflux disease)   . History of colonic polyps   . HTN (hypertension)   . Hyperlipidemia   . PUD (peptic ulcer disease)    with hx of H. Pylori     Patient Active Problem List   Diagnosis Date Noted  . Adenomatous polyp of ascending colon 01/18/2019  . Bronchitis 02/04/2018  . Microcalcification of both breasts on mammogram 02/04/2018  . Allergic rhinitis 05/23/2015  . Arthropathy, lower leg 05/23/2015  . Benign neoplasm of colon 05/23/2015  . Clinical depression 05/23/2015  . Essential (primary)  hypertension 05/23/2015  . Acid reflux 05/23/2015  . Anxiety, generalized 05/23/2015  . Hypercholesteremia 05/23/2015  . Blood glucose elevated 05/23/2015  . Symptomatic menopausal or female climacteric states 05/23/2015  . Arthritis, degenerative 05/23/2015  . Mixed incontinence 03/24/2013    Past Surgical History:  Procedure Laterality Date  . ABDOMINAL HYSTERECTOMY    . BREAST BIOPSY Right 2011   benign mass done in Dr. [place] office  . BREAST BIOPSY Right 06/29/2015   top hat marker. PROLIFERATIVE FIBROCYSTIC CHANGE WITH ASSOCIATED   . CERVICAL DISCECTOMY    . CHOLECYSTECTOMY    . COLONOSCOPY WITH PROPOFOL N/A 10/06/2016   Procedure: COLONOSCOPY WITH PROPOFOL;  Surgeon: Lollie Sails, MD;  Location: [facility] ENDOSCOPY;  Service: Endoscopy;  Laterality: N/A;  . COLONOSCOPY WITH PROPOFOL N/A 12/30/2018   Procedure: COLONOSCOPY WITH PROPOFOL;  Surgeon: Lollie Sails, MD;  Location: [facility] ENDOSCOPY;  Service: Endoscopy;  Laterality: N/A;  . EYE SURGERY    . hysterectomy-unspecified area      Her family history includes Bipolar disorder in her son; COPD in her mother; Colon cancer (age of onset: 43) in her mother; Colon polyps in her daughter; Diabetes in her father; Heart attack in her mother; Heart failure in her father; Hypertension in her brother and son; Irritable bowel syndrome in her daughter; Kidney Stones in her son; Kidney cancer in an other family member; Kidney failure in her father; Macular degeneration in her mother; Melanoma in her son; Osteoporosis in her mother; Ulcers in her mother.   Current Outpatient Medications:  .  acetaminophen (TYLENOL) 325 MG tablet, Take 650 mg by mouth every 6 (six) hours as needed for moderate pain., Disp: , Rfl:  .  ALPRAZolam (XANAX) 0.5 MG tablet, TAKE 1 TABLET BY MOUTH EVERY 12 HOURS AS NEEDED (Patient taking differently: Take 0.5 mg by mouth 2 (two) times daily. ), Disp: 60 tablet, Rfl: 3 .  aspirin (ASPIRIN EC LO-DOSE) 81 MG EC  tablet, Take 81 mg by mouth at bedtime. , Disp: , Rfl:  .  baclofen (LIORESAL) 10 MG tablet, Take 1 tablet (10 mg total) by mouth 3 (three) times daily as needed for muscle spasms., Disp: 60 each, Rfl: 3 .  DILT-XR 180 MG 24 hr capsule, TAKE 1 CAPSULE BY MOUTH ONCE DAILY (Patient taking differently: Take 180 mg by mouth daily. ), Disp: 90 capsule, Rfl: 3 .  fluticasone (FLONASE) 50 MCG/ACT nasal spray, Place 2 sprays into both nostrils daily as needed for allergies., Disp: , Rfl:  .  ketotifen (ZADITOR) 0.025 % ophthalmic solution, Place 1 drop into both eyes daily as needed (allergies)., Disp: , Rfl:  .  losartan (COZAAR) 50 MG tablet, TAKE 1 TABLET BY MOUTH ONCE DAILY (Patient taking differently: Take 50 mg by mouth daily. ), Disp: 90 tablet, Rfl: 0 .  metoCLOPramide (REGLAN) 5 MG tablet, Take 1 tablet (5 mg total) by mouth every 6 (six) hours as needed for nausea. Take your first dose 30 minutes prior to staring your Miralax prep, Disp: 2 tablet, Rfl: 0 .  metroNIDAZOLE (FLAGYL) 500 MG tablet, Take 1 tablet at 6 pm and take 1 tablet at 11 pm the evening prior to surgery., Disp: 2 tablet, Rfl: 0 .  Multiple Vitamins-Minerals (PRESERVISION AREDS 2 PO), Take 1 tablet by mouth 2 (two) times daily., Disp: , Rfl:  .  neomycin (MYCIFRADIN) 500 MG tablet, Take 2 tablets at 6 pm, then take 2 tablets at 11 pm the evening prior to surgery., Disp: 4 tablet, Rfl: 0 .  omeprazole (PRILOSEC) 20 MG capsule, TAKE 1 CAPSULE BY MOUTH ONCE DAILY (Patient taking differently: Take 20 mg by mouth daily. ), Disp: 90 capsule, Rfl: 3 .  polyethylene glycol powder (GLYCOLAX/MIRALAX) powder, Mix whole container with 64 ounces of clear liquids, No Red liquids., Disp: 255 g, Rfl: 0 .  sertraline (ZOLOFT) 50 MG tablet, TAKE 1 TABLET BY MOUTH ONCE DAILY (Patient taking differently: Take 50 mg by mouth at bedtime. ), Disp: 90 tablet, Rfl: 0  Patient Care Team: Virginia Crews, MD as PCP - General (Family Medicine) Birder Robson, MD as Referring Physician (Ophthalmology) Sharlet Salina, MD as Referring Physician (Physical Medicine and Rehabilitation) Margaretha Sheffield, MD (Otolaryngology) Lollie Sails, MD as Consulting Physician (Gastroenterology) Brita Romp Dionne Bucy, MD as Consulting Physician (Family Medicine)     Objective:    Vitals: BP (!) 159/74 (BP Location: Left Arm, Patient Position: Sitting, Cuff Size: Large)   Pulse 67   Temp 98.3 F (36.8 C) (Oral)   Ht 5\' 9"  (1.753 m)   Wt 176 lb 9.6 oz (80.1 kg)   SpO2 97%   BMI 26.08 kg/m   Physical Exam Vitals signs reviewed.  Constitutional:      General: She is not in acute distress.    Appearance: Normal appearance. She is well-developed. She is not diaphoretic.  HENT:     Head: Normocephalic and atraumatic.     Right Ear: Tympanic membrane, ear canal and external ear normal.     Left Ear: Tympanic membrane, ear canal and external ear normal.     Nose: Nose normal. No congestion.     Mouth/Throat:     Mouth: Mucous membranes are moist.     Pharynx: Oropharynx is clear. No oropharyngeal exudate.  Eyes:     General: No scleral icterus.    Conjunctiva/sclera: Conjunctivae normal.     Pupils: Pupils are equal, round, and reactive to light.  Neck:     Musculoskeletal: Neck supple.     Thyroid: No thyromegaly.  Cardiovascular:     Rate and Rhythm: Normal rate and regular rhythm.     Pulses: Normal pulses.     Heart sounds: Murmur present.  Pulmonary:     Effort: Pulmonary effort is normal. No respiratory distress.     Breath sounds: Normal breath sounds. No wheezing or rales.  Abdominal:     General: Bowel sounds are normal. There is no distension.     Palpations: Abdomen is soft.     Tenderness: There is no abdominal tenderness. There is no guarding or rebound.  Musculoskeletal:        General: No deformity.     Right lower leg: No edema.     Left lower leg: No edema.  Lymphadenopathy:     Cervical: No cervical adenopathy.  Skin:    General: Skin is warm and dry.     Capillary Refill: Capillary refill takes less than 2 seconds.     Findings: No rash.  Neurological:     Mental Status: She is alert and oriented to person, place, and time.  Psychiatric:        Mood and Affect: Mood normal.        Behavior: Behavior normal.        Thought Content: Thought content normal.     Activities of Daily Living In your present state of health, do you have any difficulty performing the following activities: 01/19/2019 12/20/2018  Hearing? N N  Vision? N N  Comment - Due to MD.  Difficulty concentrating or making decisions? N Y  Walking or climbing stairs? N N  Dressing or bathing? N N  Doing errands, shopping? N N  Preparing Food and eating ? - N  Using the Toilet? - N  In the past six months, have you accidently leaked urine? - Y  Comment - Wears protection at night.   Do you have problems with loss of bowel control? - Y  Comment - Rarely due to possible IBS?  Managing your Medications? - N  Managing your Finances? - N  Housekeeping or managing your Housekeeping? - N  Some recent data might be hidden    Fall Risk Assessment Fall Risk  01/17/2019 12/20/2018 12/07/2017 12/08/2016 07/22/2015  Falls in the past year? 0 0 No No No  Number falls in past yr: 0 - - - -  Follow up Falls evaluation completed - - - -     Depression Screen PHQ 2/9 Scores 12/20/2018 12/20/2018 12/07/2017 12/08/2016  PHQ - 2 Score 1 1 0 1  PHQ- 9 Score 2 - - -    6CIT Screen 12/20/2018  What Year? 0 points  What month? 0 points  What time? 0 points  Count back from 20 0 points  Months in reverse 0 points  Repeat phrase 2 points  Total Score 2      Assessment & Plan:    Annual Physical Reviewed patient's Family Medical History Reviewed and updated list of patient's medical providers Assessment of cognitive impairment was done Assessed patient's functional ability Established a written schedule for health screening  [place] Completed and Reviewed  Exercise Activities and Dietary recommendations Goals    . DIET - DECREASE SODA OR JUICE INTAKE     Recommend to cut back to 1 soda a day and increase water intake to 4-6 8 oz glasses a day.     Marland Kitchen DIET - INCREASE WATER INTAKE     Recommend increasing water intake 6-8 glasses of water a day.     . Increase water intake     Starting 12/08/16, I will work to increase my water intake to 4-5 glasses a day.       Immunization History  Administered Date(s) Administered  . Influenza, High Dose Seasonal PF 08/25/2017, 08/13/2018  . Pneumococcal Conjugate-13 05/17/2014  . Pneumococcal Polysaccharide-23 04/11/2012  . Td 04/08/2010  . Tdap 04/11/2012  . Zoster 02/04/2009    Health Maintenance  Topic Date Due  . DEXA SCAN  09/26/2015  . COLONOSCOPY  12/31/2019  . TETANUS/TDAP  04/11/2022  . INFLUENZA VACCINE  Completed  . PNA vac Low Risk Adult  Completed     Discussed health benefits of physical activity, and encouraged her to engage in regular exercise appropriate for her age  and condition.    ------------------------------------------------------------------------------------------------------------  Problem List Items Addressed This Visit      Cardiovascular and Mediastinum   Essential (primary) hypertension    Elevated today, likely 2/2 stress about upcoming surgery Will recheck at next visit No changes to current medications Reviewed metabolic panel        Other   Hypercholesteremia    Will add on lipid panel to labs from yesterday and review when available      Blood glucose elevated    Reviewed glucose on CMP and it was 100 when non fasting Discussed low carb diet       Other Visit Diagnoses    Encounter for annual physical exam    -  Primary       Return in about 6 months (around 07/21/2019) for chronic disease f/u.   The entirety of the information documented in the History of Present Illness,  Review of Systems and Physical Exam were personally obtained by me. Portions of this information were initially documented by Tiburcio Pea, CMA and reviewed by me for thoroughness and accuracy.    Virginia Crews, MD, MPH [facility] 01/21/2019 9:53 AM

## 2019-01-20 NOTE — Patient Instructions (Signed)
Preventive Care 42 Years and Older, Female Preventive care refers to lifestyle choices and visits with your health care provider that can promote health and wellness. What does preventive care include?  A yearly physical exam. This is also called an annual well check.  Dental exams once or twice a year.  Routine eye exams. Ask your health care provider how often you should have your eyes checked.  Personal lifestyle choices, including: ? Daily care of your teeth and gums. ? Regular physical activity. ? Eating a healthy diet. ? Avoiding tobacco and drug use. ? Limiting alcohol use. ? Practicing safe sex. ? Taking low-dose aspirin every day. ? Taking vitamin and mineral supplements as recommended by your health care provider. What happens during an annual well check? The services and screenings done by your health care provider during your annual well check will depend on your age, overall health, lifestyle risk factors, and family history of disease. Counseling Your health care provider may ask you questions about your:  Alcohol use.  Tobacco use.  Drug use.  Emotional well-being.  Home and relationship well-being.  Sexual activity.  Eating habits.  History of falls.  Memory and ability to understand (cognition).  Work and work Statistician.  Reproductive health.  Screening You may have the following tests or measurements:  Height, weight, and BMI.  Blood pressure.  Lipid and cholesterol levels. These may be checked every 5 years, or more frequently if you are over 30 years old.  Skin check.  Lung cancer screening. You may have this screening every year starting at age 27 if you have a 30-pack-year history of smoking and currently smoke or have quit within the past 15 years.  Colorectal cancer screening. All adults should have this screening starting at age 33 and continuing until age 46. You will have tests every 1-10 years, depending on your results and the  type of screening test. People at increased risk should start screening at an earlier age. Screening tests may include: ? Guaiac-based fecal occult blood testing. ? Fecal immunochemical test (FIT). ? Stool DNA test. ? Virtual colonoscopy. ? Sigmoidoscopy. During this test, a flexible tube with a tiny camera (sigmoidoscope) is used to examine your rectum and lower colon. The sigmoidoscope is inserted through your anus into your rectum and lower colon. ? Colonoscopy. During this test, a long, thin, flexible tube with a tiny camera (colonoscope) is used to examine your entire colon and rectum.  Hepatitis C blood test.  Hepatitis B blood test.  Sexually transmitted disease (STD) testing.  Diabetes screening. This is done by checking your blood sugar (glucose) after you have not eaten for a while (fasting). You may have this done every 1-3 years.  Bone density scan. This is done to screen for osteoporosis. You may have this done starting at age 37.  Mammogram. This may be done every 1-2 years. Talk to your health care provider about how often you should have regular mammograms. Talk with your health care provider about your test results, treatment options, and if necessary, the need for more tests. Vaccines Your health care provider may recommend certain vaccines, such as:  Influenza vaccine. This is recommended every year.  Tetanus, diphtheria, and acellular pertussis (Tdap, Td) vaccine. You may need a Td booster every 10 years.  Varicella vaccine. You may need this if you have not been vaccinated.  Zoster vaccine. You may need this after age 38.  Measles, mumps, and rubella (MMR) vaccine. You may need at least  one dose of MMR if you were born in 1957 or later. You may also need a second dose.  Pneumococcal 13-valent conjugate (PCV13) vaccine. One dose is recommended after age 24.  Pneumococcal polysaccharide (PPSV23) vaccine. One dose is recommended after age 24.  Meningococcal  vaccine. You may need this if you have certain conditions.  Hepatitis A vaccine. You may need this if you have certain conditions or if you travel or work in places where you may be exposed to hepatitis A.  Hepatitis B vaccine. You may need this if you have certain conditions or if you travel or work in places where you may be exposed to hepatitis B.  Haemophilus influenzae type b (Hib) vaccine. You may need this if you have certain conditions. Talk to your health care provider about which screenings and vaccines you need and how often you need them. This information is not intended to replace advice given to you by your health care provider. Make sure you discuss any questions you have with your health care provider. Document Released: 12/06/2015 Document Revised: 12/30/2017 Document Reviewed: 09/10/2015 Elsevier Interactive Patient Education  2019 [employer].

## 2019-01-21 NOTE — Assessment & Plan Note (Signed)
Will add on lipid panel to labs from yesterday and review when available

## 2019-01-21 NOTE — Assessment & Plan Note (Signed)
Elevated today, likely 2/2 stress about upcoming surgery Will recheck at next visit No changes to current medications Reviewed metabolic panel

## 2019-01-21 NOTE — Assessment & Plan Note (Signed)
Reviewed glucose on CMP and it was 100 when non fasting Discussed low carb diet

## 2019-01-22 ENCOUNTER — Encounter: Payer: Self-pay | Admitting: Anesthesiology

## 2019-01-23 ENCOUNTER — Encounter: Payer: Self-pay | Admitting: *Deleted

## 2019-01-23 ENCOUNTER — Inpatient Hospital Stay: Payer: PPO | Admitting: Anesthesiology

## 2019-01-23 ENCOUNTER — Inpatient Hospital Stay
Admission: RE | Admit: 2019-01-23 | Discharge: 2019-01-26 | DRG: 331 | Disposition: A | Discharge status: Home / Self Care | Payer: PPO | Attending: General Surgery | Admitting: General Surgery

## 2019-01-23 ENCOUNTER — Other Ambulatory Visit: Payer: Self-pay

## 2019-01-23 ENCOUNTER — Encounter
Admission: RE | Disposition: A | Discharge status: Home / Self Care | Payer: Self-pay | Source: Home / Self Care | Attending: General Surgery

## 2019-01-23 DIAGNOSIS — D126 Benign neoplasm of colon, unspecified: Secondary | ICD-10-CM | POA: Diagnosis present

## 2019-01-23 DIAGNOSIS — Z8379 Family history of other diseases of the digestive system: Secondary | ICD-10-CM

## 2019-01-23 DIAGNOSIS — Z880 Allergy status to penicillin: Secondary | ICD-10-CM | POA: Diagnosis not present

## 2019-01-23 DIAGNOSIS — I10 Essential (primary) hypertension: Secondary | ICD-10-CM | POA: Diagnosis present

## 2019-01-23 DIAGNOSIS — Z841 Family history of disorders of kidney and ureter: Secondary | ICD-10-CM | POA: Diagnosis not present

## 2019-01-23 DIAGNOSIS — K219 Gastro-esophageal reflux disease without esophagitis: Secondary | ICD-10-CM | POA: Diagnosis present

## 2019-01-23 DIAGNOSIS — Z833 Family history of diabetes mellitus: Secondary | ICD-10-CM

## 2019-01-23 DIAGNOSIS — Z8619 Personal history of other infectious and parasitic diseases: Secondary | ICD-10-CM

## 2019-01-23 DIAGNOSIS — Z800 Family history of malignant neoplasm of digestive organs: Secondary | ICD-10-CM

## 2019-01-23 DIAGNOSIS — Z8051 Family history of malignant neoplasm of kidney: Secondary | ICD-10-CM

## 2019-01-23 DIAGNOSIS — Z7951 Long term (current) use of inhaled steroids: Secondary | ICD-10-CM

## 2019-01-23 DIAGNOSIS — Z885 Allergy status to narcotic agent status: Secondary | ICD-10-CM | POA: Diagnosis not present

## 2019-01-23 DIAGNOSIS — Z79899 Other long term (current) drug therapy: Secondary | ICD-10-CM

## 2019-01-23 DIAGNOSIS — F419 Anxiety disorder, unspecified: Secondary | ICD-10-CM | POA: Diagnosis present

## 2019-01-23 DIAGNOSIS — Z9049 Acquired absence of other specified parts of digestive tract: Secondary | ICD-10-CM

## 2019-01-23 DIAGNOSIS — K660 Peritoneal adhesions (postprocedural) (postinfection): Secondary | ICD-10-CM | POA: Diagnosis present

## 2019-01-23 DIAGNOSIS — Z825 Family history of asthma and other chronic lower respiratory diseases: Secondary | ICD-10-CM

## 2019-01-23 DIAGNOSIS — Z8249 Family history of ischemic heart disease and other diseases of the circulatory system: Secondary | ICD-10-CM | POA: Diagnosis not present

## 2019-01-23 DIAGNOSIS — Z8719 Personal history of other diseases of the digestive system: Secondary | ICD-10-CM | POA: Diagnosis not present

## 2019-01-23 DIAGNOSIS — D122 Benign neoplasm of ascending colon: Secondary | ICD-10-CM

## 2019-01-23 DIAGNOSIS — Z888 Allergy status to other drugs, medicaments and biological substances status: Secondary | ICD-10-CM

## 2019-01-23 DIAGNOSIS — D120 Benign neoplasm of cecum: Secondary | ICD-10-CM | POA: Diagnosis not present

## 2019-01-23 DIAGNOSIS — Z881 Allergy status to other antibiotic agents status: Secondary | ICD-10-CM

## 2019-01-23 DIAGNOSIS — Z8711 Personal history of peptic ulcer disease: Secondary | ICD-10-CM

## 2019-01-23 DIAGNOSIS — Z7982 Long term (current) use of aspirin: Secondary | ICD-10-CM

## 2019-01-23 DIAGNOSIS — Z8371 Family history of colonic polyps: Secondary | ICD-10-CM | POA: Diagnosis not present

## 2019-01-23 HISTORY — PX: LAPAROSCOPIC RIGHT COLECTOMY: SHX5925

## 2019-01-23 LAB — ABO/RH: ABO/RH(D): B POS

## 2019-01-23 SURGERY — COLECTOMY, RIGHT, LAPAROSCOPIC
Anesthesia: General | Laterality: Right

## 2019-01-23 MED ORDER — ALVIMOPAN 12 MG PO CAPS
ORAL_CAPSULE | ORAL | Status: AC
  Filled 2019-01-23: qty 1

## 2019-01-23 MED ORDER — LIDOCAINE HCL (PF) 2 % IJ SOLN
INTRAMUSCULAR | Status: AC
  Filled 2019-01-23: qty 10

## 2019-01-23 MED ORDER — DEXAMETHASONE SODIUM PHOSPHATE 10 MG/ML IJ SOLN
INTRAMUSCULAR | Status: DC | PRN
  Administered 2019-01-23: 5 mg via INTRAVENOUS

## 2019-01-23 MED ORDER — FENTANYL CITRATE (PF) 100 MCG/2ML IJ SOLN
25.0000 ug | INTRAMUSCULAR | Status: DC | PRN
  Administered 2019-01-23 (×3): 25 ug via INTRAVENOUS

## 2019-01-23 MED ORDER — BACLOFEN 10 MG PO TABS
10.0000 mg | ORAL_TABLET | Freq: Three times a day (TID) | ORAL | Status: DC | PRN
  Filled 2019-01-23: qty 1

## 2019-01-23 MED ORDER — LACTATED RINGERS IV SOLN
INTRAVENOUS | Status: DC
  Administered 2019-01-23: 19:00:00 via INTRAVENOUS

## 2019-01-23 MED ORDER — PROPOFOL 10 MG/ML IV BOLUS
INTRAVENOUS | Status: DC | PRN
  Administered 2019-01-23: 80 mg via INTRAVENOUS

## 2019-01-23 MED ORDER — PHENYLEPHRINE HCL 10 MG/ML IJ SOLN
INTRAMUSCULAR | Status: DC | PRN
  Administered 2019-01-23: 200 ug via INTRAVENOUS
  Administered 2019-01-23 (×3): 100 ug via INTRAVENOUS

## 2019-01-23 MED ORDER — ACETAMINOPHEN 10 MG/ML IV SOLN
1000.0000 mg | Freq: Four times a day (QID) | INTRAVENOUS | Status: AC
  Administered 2019-01-23 – 2019-01-24 (×3): 1000 mg via INTRAVENOUS
  Filled 2019-01-23 (×2): qty 100

## 2019-01-23 MED ORDER — MORPHINE SULFATE (PF) 4 MG/ML IV SOLN
2.0000 mg | INTRAVENOUS | Status: DC | PRN
  Administered 2019-01-24: 2 mg via INTRAVENOUS
  Filled 2019-01-23: qty 1

## 2019-01-23 MED ORDER — PROMETHAZINE HCL 25 MG/ML IJ SOLN
6.2500 mg | INTRAMUSCULAR | Status: DC | PRN
  Administered 2019-01-23: 6.25 mg via INTRAVENOUS
  Filled 2019-01-23: qty 1

## 2019-01-23 MED ORDER — GABAPENTIN 300 MG PO CAPS
300.0000 mg | ORAL_CAPSULE | ORAL | Status: AC
  Administered 2019-01-23: 300 mg via ORAL

## 2019-01-23 MED ORDER — GABAPENTIN 300 MG PO CAPS
ORAL_CAPSULE | ORAL | Status: AC
  Filled 2019-01-23: qty 1

## 2019-01-23 MED ORDER — IBUPROFEN 200 MG PO TABS
200.0000 mg | ORAL_TABLET | ORAL | Status: DC | PRN
  Filled 2019-01-23: qty 1

## 2019-01-23 MED ORDER — ACETAMINOPHEN 10 MG/ML IV SOLN
INTRAVENOUS | Status: AC
  Filled 2019-01-23: qty 100

## 2019-01-23 MED ORDER — KETOTIFEN FUMARATE 0.025 % OP SOLN
1.0000 [drp] | Freq: Every day | OPHTHALMIC | Status: DC | PRN
  Filled 2019-01-23: qty 5

## 2019-01-23 MED ORDER — ACETAMINOPHEN 10 MG/ML IV SOLN
INTRAVENOUS | Status: DC | PRN
  Administered 2019-01-23: 1000 mg via INTRAVENOUS

## 2019-01-23 MED ORDER — BUPIVACAINE HCL (PF) 0.5 % IJ SOLN
INTRAMUSCULAR | Status: AC
  Filled 2019-01-23: qty 30

## 2019-01-23 MED ORDER — FENTANYL CITRATE (PF) 100 MCG/2ML IJ SOLN
INTRAMUSCULAR | Status: DC | PRN
  Administered 2019-01-23 (×2): 50 ug via INTRAVENOUS

## 2019-01-23 MED ORDER — LACTATED RINGERS IV SOLN
INTRAVENOUS | Status: DC
  Administered 2019-01-23 (×2): via INTRAVENOUS

## 2019-01-23 MED ORDER — ROCURONIUM BROMIDE 50 MG/5ML IV SOLN
INTRAVENOUS | Status: AC
  Filled 2019-01-23: qty 1

## 2019-01-23 MED ORDER — FLUTICASONE PROPIONATE 50 MCG/ACT NA SUSP
2.0000 | Freq: Every day | NASAL | Status: DC | PRN
  Filled 2019-01-23: qty 16

## 2019-01-23 MED ORDER — ONDANSETRON HCL 4 MG/2ML IJ SOLN
INTRAMUSCULAR | Status: DC | PRN
  Administered 2019-01-23: 4 mg via INTRAVENOUS

## 2019-01-23 MED ORDER — OXYCODONE HCL 5 MG PO TABS: 2.5000 mg | ORAL_TABLET | ORAL | Status: DC | PRN

## 2019-01-23 MED ORDER — FENTANYL CITRATE (PF) 250 MCG/5ML IJ SOLN
INTRAMUSCULAR | Status: AC
  Filled 2019-01-23: qty 5

## 2019-01-23 MED ORDER — MIDAZOLAM HCL 2 MG/2ML IJ SOLN
INTRAMUSCULAR | Status: AC
  Filled 2019-01-23: qty 2

## 2019-01-23 MED ORDER — FENTANYL CITRATE (PF) 100 MCG/2ML IJ SOLN
INTRAMUSCULAR | Status: AC
  Administered 2019-01-23: 25 ug via INTRAVENOUS
  Filled 2019-01-23: qty 2

## 2019-01-23 MED ORDER — ALVIMOPAN 12 MG PO CAPS
12.0000 mg | ORAL_CAPSULE | ORAL | Status: AC
  Administered 2019-01-23: 12 mg via ORAL

## 2019-01-23 MED ORDER — ASPIRIN EC 81 MG PO TBEC
81.0000 mg | DELAYED_RELEASE_TABLET | Freq: Every day | ORAL | Status: DC
  Administered 2019-01-24 – 2019-01-25 (×3): 81 mg via ORAL
  Filled 2019-01-23 (×3): qty 1

## 2019-01-23 MED ORDER — BUPIVACAINE-EPINEPHRINE (PF) 0.5% -1:200000 IJ SOLN
INTRAMUSCULAR | Status: DC | PRN
  Administered 2019-01-23: 30 mL

## 2019-01-23 MED ORDER — EPINEPHRINE PF 1 MG/ML IJ SOLN
INTRAMUSCULAR | Status: AC
  Filled 2019-01-23: qty 1

## 2019-01-23 MED ORDER — SODIUM CHLORIDE 0.9 % IV SOLN
1.0000 g | INTRAVENOUS | Status: AC
  Administered 2019-01-23: 1 g via INTRAVENOUS
  Filled 2019-01-23: qty 1

## 2019-01-23 MED ORDER — ROCURONIUM BROMIDE 100 MG/10ML IV SOLN
INTRAVENOUS | Status: DC | PRN
  Administered 2019-01-23: 20 mg via INTRAVENOUS
  Administered 2019-01-23: 40 mg via INTRAVENOUS
  Administered 2019-01-23: 10 mg via INTRAVENOUS

## 2019-01-23 MED ORDER — ONDANSETRON HCL 4 MG/2ML IJ SOLN
4.0000 mg | Freq: Once | INTRAMUSCULAR | Status: AC | PRN
  Administered 2019-01-23: 4 mg via INTRAVENOUS

## 2019-01-23 MED ORDER — LIDOCAINE HCL (CARDIAC) PF 100 MG/5ML IV SOSY
PREFILLED_SYRINGE | INTRAVENOUS | Status: DC | PRN
  Administered 2019-01-23: 60 mg via INTRAVENOUS

## 2019-01-23 MED ORDER — LACTATED RINGERS IV SOLN: INTRAVENOUS | Status: DC | PRN

## 2019-01-23 MED ORDER — NYSTATIN-TRIAMCINOLONE 100000-0.1 UNIT/GM-% EX OINT
TOPICAL_OINTMENT | Freq: Three times a day (TID) | CUTANEOUS | Status: DC
  Administered 2019-01-24 – 2019-01-25 (×5): via TOPICAL
  Administered 2019-01-25: 1 via TOPICAL
  Administered 2019-01-26: 08:00:00 via TOPICAL
  Filled 2019-01-23: qty 15

## 2019-01-23 MED ORDER — ONDANSETRON HCL 4 MG/2ML IJ SOLN
4.0000 mg | Freq: Four times a day (QID) | INTRAMUSCULAR | Status: DC
  Filled 2019-01-23: qty 2

## 2019-01-23 MED ORDER — LOSARTAN POTASSIUM 50 MG PO TABS
50.0000 mg | ORAL_TABLET | Freq: Every day | ORAL | Status: DC
  Administered 2019-01-24 – 2019-01-26 (×4): 50 mg via ORAL
  Filled 2019-01-23 (×3): qty 1

## 2019-01-23 MED ORDER — ALPRAZOLAM 0.5 MG PO TABS
0.2500 mg | ORAL_TABLET | Freq: Two times a day (BID) | ORAL | Status: DC | PRN
  Administered 2019-01-24 – 2019-01-25 (×2): 0.25 mg via ORAL
  Filled 2019-01-23 (×2): qty 1

## 2019-01-23 MED ORDER — ONDANSETRON HCL 4 MG/2ML IJ SOLN
INTRAMUSCULAR | Status: AC
  Filled 2019-01-23: qty 2

## 2019-01-23 MED ORDER — SUGAMMADEX SODIUM 200 MG/2ML IV SOLN
INTRAVENOUS | Status: DC | PRN
  Administered 2019-01-23: 160.2 mg via INTRAVENOUS

## 2019-01-23 MED ORDER — PANTOPRAZOLE SODIUM 40 MG PO TBEC
40.0000 mg | DELAYED_RELEASE_TABLET | Freq: Every day | ORAL | Status: DC
  Administered 2019-01-24 – 2019-01-26 (×3): 40 mg via ORAL
  Filled 2019-01-23 (×4): qty 1

## 2019-01-23 MED ORDER — ACETAMINOPHEN 10 MG/ML IV SOLN
INTRAVENOUS | Status: AC
  Administered 2019-01-23: 1000 mg via INTRAVENOUS
  Filled 2019-01-23: qty 100

## 2019-01-23 MED ORDER — SUGAMMADEX SODIUM 200 MG/2ML IV SOLN
INTRAVENOUS | Status: AC
  Filled 2019-01-23: qty 2

## 2019-01-23 MED ORDER — DEXAMETHASONE SODIUM PHOSPHATE 10 MG/ML IJ SOLN
INTRAMUSCULAR | Status: AC
  Filled 2019-01-23: qty 1

## 2019-01-23 MED ORDER — ENOXAPARIN SODIUM 40 MG/0.4ML ~~LOC~~ SOLN
40.0000 mg | SUBCUTANEOUS | Status: DC
  Administered 2019-01-24 – 2019-01-26 (×3): 40 mg via SUBCUTANEOUS
  Filled 2019-01-23 (×3): qty 0.4

## 2019-01-23 MED ORDER — SERTRALINE HCL 50 MG PO TABS
50.0000 mg | ORAL_TABLET | Freq: Every day | ORAL | Status: DC
  Administered 2019-01-24 – 2019-01-25 (×3): 50 mg via ORAL
  Filled 2019-01-23 (×3): qty 1

## 2019-01-23 MED ORDER — SUCCINYLCHOLINE CHLORIDE 20 MG/ML IJ SOLN
INTRAMUSCULAR | Status: AC
  Filled 2019-01-23: qty 1

## 2019-01-23 SURGICAL SUPPLY — 73 items
APPLIER CLIP ROT 10 11.4 M/L (STAPLE)
APR CLP MED LRG 11.4X10 (STAPLE)
BLADE SURG 10 STRL SS SAFETY (BLADE) ×3 IMPLANT
BLADE SURG 11 STRL SS SAFETY (MISCELLANEOUS) ×3 IMPLANT
CANISTER SUCT 1200ML W/VALVE (MISCELLANEOUS) ×3 IMPLANT
CANNULA DILATOR 10 W/SLV (CANNULA) ×2 IMPLANT
CANNULA DILATOR 10MM W/SLV (CANNULA) ×1
CHLORAPREP W/TINT 26ML (MISCELLANEOUS) ×3 IMPLANT
CLIP APPLIE ROT 10 11.4 M/L (STAPLE) IMPLANT
CLOSURE WOUND 1/2 X4 (GAUZE/BANDAGES/DRESSINGS) ×1
COVER CLAMP SIL LG PBX B (MISCELLANEOUS) IMPLANT
COVER WAND RF STERILE (DRAPES) ×1 IMPLANT
DRAPE LAP W/FLUID (DRAPES) IMPLANT
DRAPE UNDER BUTTOCK W/FLU (DRAPES) IMPLANT
DRSG OPSITE POSTOP 3X4 (GAUZE/BANDAGES/DRESSINGS) ×2 IMPLANT
DRSG OPSITE POSTOP 4X10 (GAUZE/BANDAGES/DRESSINGS) ×1 IMPLANT
DRSG OPSITE POSTOP 4X8 (GAUZE/BANDAGES/DRESSINGS) ×3 IMPLANT
DRSG TEGADERM 2-3/8X2-3/4 SM (GAUZE/BANDAGES/DRESSINGS) ×3 IMPLANT
DRSG TEGADERM 4X4.75 (GAUZE/BANDAGES/DRESSINGS) ×1 IMPLANT
DRSG TELFA 3X8 NADH (GAUZE/BANDAGES/DRESSINGS) IMPLANT
ELECT BLADE 6.5 EXT (BLADE) ×3 IMPLANT
ELECT CAUTERY BLADE 6.4 (BLADE) ×3 IMPLANT
ELECT REM PT RETURN 9FT ADLT (ELECTROSURGICAL) ×3
ELECTRODE REM PT RTRN 9FT ADLT (ELECTROSURGICAL) ×1 IMPLANT
GLOVE BIO SURGEON STRL SZ7.5 (GLOVE) ×17 IMPLANT
GLOVE INDICATOR 8.0 STRL GRN (GLOVE) ×14 IMPLANT
GOWN STRL REUS W/ TWL LRG LVL3 (GOWN DISPOSABLE) ×6 IMPLANT
GOWN STRL REUS W/TWL LRG LVL3 (GOWN DISPOSABLE) ×18
HANDLE YANKAUER SUCT BULB TIP (MISCELLANEOUS) ×3 IMPLANT
HOLDER FOLEY CATH W/STRAP (MISCELLANEOUS) ×1 IMPLANT
IRRIGATION STRYKERFLOW (MISCELLANEOUS) IMPLANT
IRRIGATOR STRYKERFLOW (MISCELLANEOUS)
IV LACTATED RINGERS 1000ML (IV SOLUTION) ×1 IMPLANT
KIT PINK PAD W/HEAD ARE REST (MISCELLANEOUS) ×3
KIT PINK PAD W/HEAD ARM REST (MISCELLANEOUS) ×1 IMPLANT
KIT TURNOVER KIT A (KITS) ×3 IMPLANT
LABEL OR SOLS (LABEL) ×3 IMPLANT
LIGASURE LAP MARYLAND 5MM 37CM (ELECTROSURGICAL) ×2 IMPLANT
NDL INSUFF ACCESS 14 VERSASTEP (NEEDLE) ×3 IMPLANT
NDL SAFETY 22GX1.5 (NEEDLE) ×3 IMPLANT
NS IRRIG 500ML POUR BTL (IV SOLUTION) ×3 IMPLANT
PACK COLON CLEAN CLOSURE (MISCELLANEOUS) ×3 IMPLANT
PACK LAP CHOLECYSTECTOMY (MISCELLANEOUS) ×3 IMPLANT
PAD DRESSING TELFA 3X8 NADH (GAUZE/BANDAGES/DRESSINGS) ×1 IMPLANT
PAD PREP 24X41 OB/GYN DISP (PERSONAL CARE ITEMS) ×3 IMPLANT
PENCIL ELECTRO HAND CTR (MISCELLANEOUS) ×3 IMPLANT
RELOAD PROXIMATE 75MM BLUE (ENDOMECHANICALS) ×3 IMPLANT
RELOAD STAPLE 75 3.8 BLU REG (ENDOMECHANICALS) IMPLANT
RETRACTOR WOUND ALXS 18CM MED (MISCELLANEOUS) IMPLANT
RTRCTR WOUND ALEXIS O 18CM MED (MISCELLANEOUS) ×3
SCISSORS METZENBAUM CVD 33 (INSTRUMENTS) ×1 IMPLANT
SET YANKAUER POOLE SUCT (MISCELLANEOUS) ×3 IMPLANT
SLEEVE ENDOPATH XCEL 5M (ENDOMECHANICALS) ×3 IMPLANT
SPONGE LAP 18X18 RF (DISPOSABLE) ×7 IMPLANT
STAPLER PROXIMATE 75MM BLUE (STAPLE) ×2 IMPLANT
STRIP CLOSURE SKIN 1/2X4 (GAUZE/BANDAGES/DRESSINGS) ×2 IMPLANT
SUT MAXON ABS #0 GS21 30IN (SUTURE) ×8 IMPLANT
SUT SILK 2 0 (SUTURE) ×3
SUT SILK 2-0 30XBRD TIE 12 (SUTURE) ×1 IMPLANT
SUT SILK 3-0 (SUTURE) ×6 IMPLANT
SUT VIC AB 0 CT1 36 (SUTURE) ×3 IMPLANT
SUT VIC AB 2-0 BRD 54 (SUTURE) ×4 IMPLANT
SUT VIC AB 2-0 CT1 27 (SUTURE) ×6
SUT VIC AB 2-0 CT1 TAPERPNT 27 (SUTURE) ×2 IMPLANT
SUT VIC AB 3-0 54X BRD REEL (SUTURE) ×2 IMPLANT
SUT VIC AB 3-0 BRD 54 (SUTURE) ×3
SUT VIC AB 3-0 SH 27 (SUTURE) ×6
SUT VIC AB 3-0 SH 27X BRD (SUTURE) ×2 IMPLANT
SUT VIC AB 4-0 FS2 27 (SUTURE) ×6 IMPLANT
SWABSTK COMLB BENZOIN TINCTURE (MISCELLANEOUS) ×3 IMPLANT
TRAY FOLEY MTR SLVR 16FR STAT (SET/KITS/TRAYS/PACK) ×3 IMPLANT
TROCAR XCEL NON-BLD 5MMX100MML (ENDOMECHANICALS) ×3 IMPLANT
TUBING EVAC SMOKE HEATED PNEUM (TUBING) ×3 IMPLANT

## 2019-01-23 NOTE — Anesthesia Post-op Follow-up Note (Signed)
Anesthesia QCDR form completed.        

## 2019-01-23 NOTE — Progress Notes (Signed)
Order for oxycodone/roxicodone 5mg  immediate release PRN. Pt has allergy to percocet (hallucinations). Spoke with Winfield Rast, pharmacist, who states it is acceptable to give oxycodone PRN

## 2019-01-23 NOTE — Anesthesia Postprocedure Evaluation (Signed)
Anesthesia Post Note  Patient: Rita Cruz  Procedure(s) Performed: LAPAROSCOPIC RIGHT COLECTOMY (Right )  Patient location during evaluation: PACU Anesthesia Type: General Level of consciousness: awake and alert Pain management: pain level controlled Vital Signs Assessment: post-procedure vital signs reviewed and stable Respiratory status: spontaneous breathing, nonlabored ventilation, respiratory function stable and patient connected to nasal cannula oxygen Cardiovascular status: blood pressure returned to baseline and stable Postop Assessment: no apparent nausea or vomiting Anesthetic complications: no     Last Vitals:  Vitals:   01/23/19 1845 01/23/19 1901  BP:    Pulse: 67 67  Resp: 14 13  Temp:    SpO2: 95% 96%    Last Pain:  Vitals:   01/23/19 1901  TempSrc:   PainSc: Asleep                 Nayda Riesen S

## 2019-01-23 NOTE — H&P (Signed)
No change in clinical history or exam. Had nausea and vomiting w/ prep in spite of Reglan. Feeling well this AM. For lap right colectomy.

## 2019-01-23 NOTE — Anesthesia Preprocedure Evaluation (Signed)
Anesthesia Evaluation  Patient identified by MRN, date of birth, ID band Patient awake    Reviewed: Allergy & Precautions, NPO status , Patient's Chart, lab work & pertinent test results, reviewed documented beta blocker date and time   Airway Mallampati: II  TM Distance: >3 FB     Dental  (+) Chipped   Pulmonary           Cardiovascular hypertension, Pt. on medications      Neuro/Psych PSYCHIATRIC DISORDERS Anxiety Depression    GI/Hepatic PUD, GERD  Controlled,  Endo/Other    Renal/GU      Musculoskeletal  (+) Arthritis ,   Abdominal   Peds  Hematology   Anesthesia Other Findings Neck movement ok. EKG ok.  Reproductive/Obstetrics                             Anesthesia Physical Anesthesia Plan  ASA: III  Anesthesia Plan: General   Post-op Pain Management:    Induction: Intravenous  PONV Risk Score and Plan:   Airway Management Planned: Oral ETT  Additional Equipment:   Intra-op Plan:   Post-operative Plan:   Informed Consent: I have reviewed the patients History and Physical, chart, labs and discussed the procedure including the risks, benefits and alternatives for the proposed anesthesia with the patient or authorized representative who has indicated his/her understanding and acceptance.       Plan Discussed with: CRNA  Anesthesia Plan Comments:         Anesthesia Quick Evaluation

## 2019-01-23 NOTE — Anesthesia Procedure Notes (Signed)
Procedure Name: Intubation Date/Time: 01/23/2019 12:41 PM Performed by: Justus Memory, CRNA Pre-anesthesia Checklist: Patient identified, Patient being monitored, Timeout performed, Emergency Drugs available and Suction available Patient Re-evaluated:Patient Re-evaluated prior to induction Oxygen Delivery Method: Circle system utilized Preoxygenation: Pre-oxygenation with 100% oxygen Induction Type: IV induction Ventilation: Mask ventilation with difficulty Laryngoscope Size: Mac and 3 Grade View: Grade II Tube type: Oral Tube size: 7.0 mm Number of attempts: 1 Airway Equipment and Method: Stylet Placement Confirmation: ETT inserted through vocal cords under direct vision,  positive ETCO2 and breath sounds checked- equal and bilateral Secured at: 21 cm Tube secured with: Tape Dental Injury: Teeth and Oropharynx as per pre-operative assessment

## 2019-01-23 NOTE — Op Note (Addendum)
Preoperative diagnosis: Polyp of the ascending colon.  Postoperative diagnosis: Same.  Operative procedure: Laparoscopically assisted right hemicolectomy.  Operating Surgeon: Hervey Ard, MD.  Assistant: Arvilla Meres, RNFA.  Anesthesia: General endotracheal; 0.5% Marcaine with 1 to 200,000 units of epinephrine, 30 cc.  Estimated blood loss: 25 cc.  Fluids: 1500 cc crystalloid.  Clinical note: This 78 year old woman recently underwent a colonoscopy and was found to have an estimated 4-5 cm polyp in the ascending colon.  This was felt to be unresectable by the endoscopist.  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  She was felt to be a candidate for right hemicolectomy.  She underwent formal bowel prep with oral antibiotics.  She received Invanz prior to the procedure.  SCD stockings for DVT prevention.  Operative note: The patient underwent general endotracheal anesthesia without difficulty.  The abdomen was prepped with ChloraPrep and draped.  In Trendelenburg position a varies needle was placed with trans-umbilical incision.  After assuring intra-abdominal location with a hanging drop test the abdomen was insufflated with CO2 of 10 mmHg pressure.  A 10 mm Step port was expanded.  Inspection showed no evidence of injury from initial port placement.  A single band of adhesions from the omentum to the anterior abdominal wall were noted.  These were subsequently taken down with LigaSure device.  2-5 mm XL ports were placed just to the left of the midline in the epigastrium and in the left upper quadrant.  The previously inked area near the ileocecal valve was identified.  The omentum was freed from the right half of the transverse colon making use of the LigaSure device.  This was then swept around the white line of Toldt.  The right colon was mobilized across the midline.  There was some scarring near the appendix and this was freed with the LigaSure.  The terminal ileum was  freely mobile.  Visualized portion of the liver was unremarkable.  At this time the abdomen was desufflated.  An 8 cm incision was made extending cephalad from the umbilical port.  This was completed after instillation of the above-mentioned local anesthetic.  The skin was incised sharply and remaining dissection completed with electrocautery.  The abdomen was entered without difficulty.  An [place] wound protector was placed.  The right colon easily mobilized through the wound.  The mesentery was scored and was divided with the LigaSure.  The right colic vessels were controlled with 2-0 Vicryl ties as was the left branch of the middle colic vessel.  A side-to-side functional end-to-end anastomosis was completed making use of the GIA stapler.  The terminal ileum and mid transverse colon were sewn together with interrupted 3-0 silk sutures.  The device was inserted along the course of the tinea and fired.  Inspection showed good hemostasis.  A transverse firing completed the anastomosis.  The ends and cross "crotch" were reinforced with 3-0 silk figure-of-eight sutures.  The mesenteric defect was closed with a running 3-0 Vicryl suture.  The abdomen was irrigated.  Small area of bleeding near the base of the transverse colon mesentery was controlled with LigaSure device.  Final irrigation was clear.  At this time surgeon's gowns gloves were changed.  The Alexis wound protector was removed and new drapes were applied.  The fascia was approximated with interrupted 0 Maxon figure-of-eight sutures.  The adipose layer was closed with a running 2-0 Vicryl suture.  The skin closed with a running 4-0 Vicryl subcuticular suture.  Port sites were closed with 4-0  Vicryl subcuticular sutures.  Benzoin and Steri-Strips followed by a honeycomb dressing, Telfa and Tegaderm to the port sites applied.  The open specimen showed a 2 x 3 cm thick polyp just above the level of the ileocecal valve.  Also noted was a 1 cm submucosal  lipoma.  Patient tolerated the procedure well and was taken to recovery in stable condition.

## 2019-01-23 NOTE — Transfer of Care (Signed)
Immediate Anesthesia Transfer of Care Note  Patient: Rita Cruz  Procedure(s) Performed: LAPAROSCOPIC RIGHT COLECTOMY (Right )  Patient Location: PACU  Anesthesia Type:General  Level of Consciousness: sedated  Airway & Oxygen Therapy: Patient Spontanous Breathing and Patient connected to face mask oxygen  Post-op Assessment: Report given to RN and Post -op Vital signs reviewed and stable  Post vital signs: Reviewed  Last Vitals:  Vitals Value Taken Time  BP    Temp    Pulse 71 01/23/2019  2:59 PM  Resp 17 01/23/2019  2:59 PM  SpO2 98 % 01/23/2019  2:59 PM  Vitals shown include unvalidated device data.  Last Pain:  Vitals:   01/23/19 1051  TempSrc: Oral  PainSc: 0-No pain         Complications: No apparent anesthesia complications

## 2019-01-24 ENCOUNTER — Encounter: Payer: Self-pay | Admitting: General Surgery

## 2019-01-24 MED ORDER — SODIUM CHLORIDE 0.9% FLUSH
3.0000 mL | Freq: Two times a day (BID) | INTRAVENOUS | Status: DC
  Administered 2019-01-24 – 2019-01-26 (×4): 3 mL via INTRAVENOUS

## 2019-01-24 MED ORDER — SODIUM CHLORIDE 0.9% FLUSH: 3.0000 mL | INTRAVENOUS | Status: DC | PRN

## 2019-01-24 MED ORDER — SODIUM CHLORIDE 0.9 % IV SOLN: 250.0000 mL | INTRAVENOUS | Status: DC | PRN

## 2019-01-24 MED ORDER — ACETAMINOPHEN 325 MG PO TABS
650.0000 mg | ORAL_TABLET | ORAL | Status: DC
  Administered 2019-01-24 – 2019-01-26 (×9): 650 mg via ORAL
  Filled 2019-01-24 (×10): qty 2

## 2019-01-24 NOTE — Progress Notes (Signed)
Tmax 99.3, VSS. Nausea resolved w/ phenergan x 1.   Pain free this AM.  Sats: 93% on RA. Ambulated around the nursing station this AM. Diltazem on hold at present. Systolic < 502.  Will follow. Diastolic < 70. Inspirex at 1250. Lungs: Clear. Cardio: RR. ABD: Non-distended, soft.  Wound: Clean. Extrem: Soft.  Plan: Advance diet as tolerated. Continue aggressive ambulation.

## 2019-01-25 NOTE — Progress Notes (Signed)
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 She will notify her mother.

## 2019-01-25 NOTE — Progress Notes (Signed)
Afebrile, vital signs stable. Reports some crampy lower abdominal pain overnight reminiscent of prior episodes related to diverticulosis.  Had several loose stools overnight, reports awakening about 2 AM with a nocturnal stool and some sweating.  No associated vital sign changes. Lungs: Clear. Inspra X: 1250 cc Cardiac: Regular rhythm. Abdomen: Nondistended, soft, nontender. Wound: Clean Extremities: Soft. She is having a little more stool frequency than I would expect on postoperative day 2, but otherwise looks well.  She had a single dose of Invanz prior to the procedure as well as oral antibiotics prior, will check a C. difficile toxin.  She is ambulating well, and anticipate she will be able to be discharged tomorrow.

## 2019-01-26 LAB — C DIFFICILE QUICK SCREEN W PCR REFLEX
C Diff antigen: NEGATIVE
C Diff interpretation: NOT DETECTED
C Diff toxin: NEGATIVE

## 2019-01-26 NOTE — Care Management Important Message (Signed)
Copy of signed Medicare IM left with patient in room. 

## 2019-01-26 NOTE — Progress Notes (Signed)
Patient cleared for discharge.      Education complete. AVS printed. Discharge instructions given. All questions answered for patient clarification.  F/u appt details given.   IV removed.  Awaiting ride home.  Will D/c to home via POV.

## 2019-01-26 NOTE — Plan of Care (Signed)
  Problem: Coping: Goal: Level of anxiety will decrease Outcome: Progressing   Problem: Elimination: Goal: Will not experience complications related to bowel motility Outcome: Progressing   Problem: Pain Managment: Goal: General experience of comfort will improve Outcome: Progressing   

## 2019-01-26 NOTE — Final Progress Note (Signed)
AVSS. Stool frequency down, firming up. Easily moving in room. Declined to walk in hall yesterday for fear of the flu. Tolerating diet well. Lungs: Clear. Cardio: RR. ABD: Soft, non-tender. Wound: Clean. Extrem: Soft. Path reviewed: benign. Home today. Discussed need to ambulate and use spirometer at home.

## 2019-01-27 ENCOUNTER — Telehealth: Payer: Self-pay

## 2019-01-27 MED ORDER — OSELTAMIVIR PHOSPHATE 75 MG PO CAPS: 75.0000 mg | ORAL_CAPSULE | Freq: Every day | ORAL | 0 refills | Status: AC

## 2019-01-27 NOTE — Telephone Encounter (Signed)
OK to Rx Tamiflu 75 mg daily x10 days #10, r0 for flu prophylaxis

## 2019-01-27 NOTE — Telephone Encounter (Signed)
Patient advised. Prescription sent into pharmacy.  

## 2019-01-27 NOTE — Telephone Encounter (Signed)
Patient and husband have both been in and out of hospital with illness and having surgeries.  Daughter has been living with them the last 1-2 weeks care for both.  She has tested positive for Flu and they want to know if they should get on Tamiflu due to their current situation.  If so can we send it in to pharmacy. Thanks

## 2019-01-30 LAB — SURGICAL PATHOLOGY

## 2019-01-30 NOTE — Discharge Summary (Signed)
Physician Discharge Summary  Patient ID: Rita Cruz MRN: [id number] DOB/AGE: 12/28/1940 78 y.o.  Admit date: 01/23/2019 Discharge date: 01/30/2019  Admission Diagnoses: Tubulovillous adenoma of the ascending colon  Discharge Diagnoses:  Active Problems:   Tubulovillous adenoma polyp of colon   Discharged Condition: good  Hospital Course: Tolerated diet well. Ambulated early and often.  Good pain control with minimal narcotic requirements.   Consults: None  Significant Diagnostic Studies: pathology: TVA without atypia.   Treatments: IV hydration  Discharge Exam: Blood pressure (!) 126/53, pulse 77, temperature 98.5 F (36.9 C), temperature source Oral, resp. rate 18, height 5\' 9"  (1.753 m), weight 80.1 kg, SpO2 95 %. General appearance: alert and cooperative Resp: clear to auscultation bilaterally Cardio: regular rate and rhythm, S1, S2 normal, no murmur, click, rub or gallop GI: soft, non-tender; bowel sounds normal; no masses,  no organomegaly Incision/Wound: clean and dry  Disposition:   Discharge Instructions    Diet - low sodium heart healthy   Complete by:  As directed    Discharge instructions   Complete by:  As directed    OK to shower.   No driving until pain free. No lifting over 10 pounds. Diet as tolerated. Avoid foods that have given you gas or diarrhea in the past.  Heating pad to abdomen if needed for comfort.   Increase activity slowly   Complete by:  As directed      Allergies as of 01/26/2019      Reactions   Percocet  [oxycodone-acetaminophen] Other (See Comments)   Hallucinations Hallucinations   Levaquin [levofloxacin In D5w] Diarrhea   Vaginal itching   Lisinopril    cough   Meloxicam    Upset stomach   Penicillins Itching   Preservision Areds 2 [multiple Vitamins-minerals] Nausea Only      Medication List    STOP taking these medications   Dilt-XR 180 MG 24 hr capsule Generic drug:  diltiazem   metoCLOPramide 5 MG  tablet Commonly known as:  REGLAN   metroNIDAZOLE 500 MG tablet Commonly known as:  FLAGYL   neomycin 500 MG tablet Commonly known as:  MYCIFRADIN   polyethylene glycol powder powder Commonly known as:  GLYCOLAX/MIRALAX     TAKE these medications   acetaminophen 325 MG tablet Commonly known as:  TYLENOL Take 650 mg by mouth every 6 (six) hours as needed for moderate pain.   ALPRAZolam 0.5 MG tablet Commonly known as:  XANAX TAKE 1 TABLET BY MOUTH EVERY 12 HOURS AS NEEDED What changed:  when to take this   Aspirin EC Lo-Dose 81 MG EC tablet Generic drug:  aspirin Take 81 mg by mouth at bedtime.   baclofen 10 MG tablet Commonly known as:  LIORESAL Take 1 tablet (10 mg total) by mouth 3 (three) times daily as needed for muscle spasms.   fluticasone 50 MCG/ACT nasal spray Commonly known as:  FLONASE Place 2 sprays into both nostrils daily as needed for allergies.   ketotifen 0.025 % ophthalmic solution Commonly known as:  ZADITOR Place 1 drop into both eyes daily as needed (allergies).   losartan 50 MG tablet Commonly known as:  COZAAR TAKE 1 TABLET BY MOUTH ONCE DAILY   omeprazole 20 MG capsule Commonly known as:  PRILOSEC TAKE 1 CAPSULE BY MOUTH ONCE DAILY   PRESERVISION AREDS 2 PO Take 1 tablet by mouth 2 (two) times daily.   sertraline 50 MG tablet Commonly known as:  ZOLOFT TAKE 1 TABLET BY MOUTH ONCE  DAILY What changed:  when to take this      Follow-up Information    Orella Cushman, Forest Gleason, MD. Go on 02/02/2019.   Specialties:  General Surgery, Radiology Why:  at 2:45pm for hospital follow up Contact information: [place] [address] [phone]           Signed: Robert Bellow 01/30/2019, 11:30 AM

## 2019-02-02 ENCOUNTER — Encounter: Payer: Self-pay | Admitting: General Surgery

## 2019-02-02 ENCOUNTER — Other Ambulatory Visit: Payer: Self-pay

## 2019-02-02 ENCOUNTER — Ambulatory Visit (INDEPENDENT_AMBULATORY_CARE_PROVIDER_SITE_OTHER): Payer: PPO | Admitting: General Surgery

## 2019-02-02 VITALS — BP 165/95 | HR 80 | Temp 97.7°F | Resp 14 | Ht 69.0 in | Wt 174.0 lb

## 2019-02-02 DIAGNOSIS — D122 Benign neoplasm of ascending colon: Secondary | ICD-10-CM

## 2019-02-02 NOTE — Patient Instructions (Signed)
The patient is aware to call back for any questions or new concerns.  

## 2019-02-02 NOTE — Progress Notes (Signed)
Patient ID: Rita Cruz, female   DOB: 07-21-1941, 77 y.o.   MRN: [id number]  Chief Complaint  Patient presents with  . Routine Post Op    HPI Rita Cruz is a 78 y.o. female here today for her post op right colectomy done on 01/23/2019. She states she is doing well. Her caregiver has the flu, so Dr Rosanna Randy placed her on Tamiflu as well. Bowels moving well described as "loose", appetite is fair. She is here with her husband, [name].  HPI  Past Medical History:  Diagnosis Date  . Adenomatous polyps   . Anxiety   . Arthritis    neck  . Capsulitis of toe of right foot   . Chest pain    Adenosine Myoview in 2006 done for atypical chest pain, showed no eviden e for ischemia or infarct  . Colon polyps   . GERD (gastroesophageal reflux disease)   . History of colonic polyps   . HTN (hypertension)   . Hyperlipidemia   . PUD (peptic ulcer disease)    with hx of H. Pylori    Past Surgical History:  Procedure Laterality Date  . ABDOMINAL HYSTERECTOMY    . BREAST BIOPSY Right 2011   benign mass done in Dr. [place] office  . BREAST BIOPSY Right 06/29/2015   top hat marker. PROLIFERATIVE FIBROCYSTIC CHANGE WITH ASSOCIATED   . CERVICAL DISCECTOMY    . CHOLECYSTECTOMY    . COLONOSCOPY WITH PROPOFOL N/A 10/06/2016   Procedure: COLONOSCOPY WITH PROPOFOL;  Surgeon: Lollie Sails, MD;  Location: [facility] ENDOSCOPY;  Service: Endoscopy;  Laterality: N/A;  . COLONOSCOPY WITH PROPOFOL N/A 12/30/2018   Procedure: COLONOSCOPY WITH PROPOFOL;  Surgeon: Lollie Sails, MD;  Location: [facility] ENDOSCOPY;  Service: Endoscopy;  Laterality: N/A;  . EYE SURGERY    . hysterectomy-unspecified area    . LAPAROSCOPIC RIGHT COLECTOMY Right 01/23/2019   Procedure: LAPAROSCOPIC RIGHT COLECTOMY;  Surgeon: Robert Bellow, MD;  Location: [facility] ORS;  Service: General;  Laterality: Right;    Family History  Problem Relation Age of Onset  . Kidney failure Father   . Heart failure Father   .  Diabetes Father   . COPD Mother   . Colon cancer Mother 75  . Heart attack Mother   . Ulcers Mother   . Osteoporosis Mother   . Macular degeneration Mother   . Hypertension Brother   . Irritable bowel syndrome Daughter   . Colon polyps Daughter   . Kidney Stones Son   . Bipolar disorder Son   . Melanoma Son        stage IV  . Hypertension Son   . Kidney cancer Other     Social History Social History   Tobacco Use  . Smoking status: Never Smoker  . Smokeless tobacco: Never Used  . Tobacco comment: does not smoke  Substance Use Topics  . Alcohol use: Yes    Alcohol/week: 0.0 standard drinks    Comment: rarely - wine  . Drug use: No    Allergies  Allergen Reactions  . Percocet  [Oxycodone-Acetaminophen] Other (See Comments)    Hallucinations Hallucinations  . Levaquin [Levofloxacin In D5w] Diarrhea    Vaginal itching  . Lisinopril     cough  . Meloxicam     Upset stomach  . Penicillins Itching  . Preservision Areds 2 [Multiple Vitamins-Minerals] Nausea Only    Current Outpatient Medications  Medication Sig Dispense Refill  . acetaminophen (TYLENOL) 325 MG tablet Take  650 mg by mouth every 6 (six) hours as needed for moderate pain.    Marland Kitchen ALPRAZolam (XANAX) 0.5 MG tablet TAKE 1 TABLET BY MOUTH EVERY 12 HOURS AS NEEDED (Patient taking differently: Take 0.5 mg by mouth 2 (two) times daily. ) 60 tablet 3  . aspirin (ASPIRIN EC LO-DOSE) 81 MG EC tablet Take 81 mg by mouth at bedtime.     . baclofen (LIORESAL) 10 MG tablet Take 1 tablet (10 mg total) by mouth 3 (three) times daily as needed for muscle spasms. 60 each 3  . fluticasone (FLONASE) 50 MCG/ACT nasal spray Place 2 sprays into both nostrils daily as needed for allergies.    Marland Kitchen ketotifen (ZADITOR) 0.025 % ophthalmic solution Place 1 drop into both eyes daily as needed (allergies).    . losartan (COZAAR) 50 MG tablet TAKE 1 TABLET BY MOUTH ONCE DAILY (Patient taking differently: Take 50 mg by mouth daily. ) 90 tablet  0  . Multiple Vitamins-Minerals (PRESERVISION AREDS 2 PO) Take 1 tablet by mouth 2 (two) times daily.    Marland Kitchen omeprazole (PRILOSEC) 20 MG capsule TAKE 1 CAPSULE BY MOUTH ONCE DAILY (Patient taking differently: Take 20 mg by mouth daily. ) 90 capsule 3  . oseltamivir (TAMIFLU) 75 MG capsule Take 1 capsule (75 mg total) by mouth daily for 10 days. 10 capsule 0  . sertraline (ZOLOFT) 50 MG tablet TAKE 1 TABLET BY MOUTH ONCE DAILY (Patient taking differently: Take 50 mg by mouth at bedtime. ) 90 tablet 0   No current facility-administered medications for this visit.     Review of Systems Review of Systems  Constitutional: Negative.   Respiratory: Negative.   Cardiovascular: Negative.     Blood pressure (!) 165/95, pulse 80, temperature 97.7 F (36.5 C), temperature source Temporal, resp. rate 14, height 5\' 9"  (1.753 m), weight 174 lb (78.9 kg), SpO2 97 %.  The patient's diltiazem, 180 mg XR was held at discharge to minimize the risk of hypotension.  Physical Exam Physical Exam Constitutional:      Appearance: Normal appearance.  HENT:     Mouth/Throat:     Pharynx: No oropharyngeal exudate.  Eyes:     General: No scleral icterus. Neck:     Musculoskeletal: Neck supple.  Cardiovascular:     Rate and Rhythm: Normal rate and regular rhythm.     Heart sounds: Normal heart sounds.  Pulmonary:     Effort: Pulmonary effort is normal.     Breath sounds: Normal breath sounds.  Abdominal:     General: Abdomen is flat. Bowel sounds are normal.     Palpations: Abdomen is soft.    Skin:    General: Skin is warm and dry.  Neurological:     Mental Status: She is alert.  Psychiatric:        Mood and Affect: Mood normal.     Data Reviewed January 23, 2019  A. COLON, RIGHT; HEMICOLECTOMY:  - TUBULOVILLOUS ADENOMA.  - MULTIPLE SMALL TUBULAR ADENOMAS (X3).  - BENIGN SUBMUCOSAL LIPOMA.  - BENIGN APPENDIX WITH NO SIGNIFICANT HISTOPATHOLOGIC CHANGE  - FIVE LYMPH NODES, NEGATIVE FOR  MALIGNANCY (0/5).  - NEGATIVE FOR HIGH-GRADE DYSPLASIA AND MALIGNANCY.  ADDENDUM:  The specimen was reevaluated in an attempt to identify additional  possible lymph nodes. Eight (8) further lymph nodes are identified, and  all are negative for malignancy. This brings the final lymph node count  to thirteen total benign lymph nodes (0/13).   Assessment Doing well post  right hemicolectomy for a large tubulovillous adenoma.  Modest increase in baseline blood pressure off diltiazem.  Plan  Follow up in one month The patient's blood pressure regimen was discussed with Dr. Rosanna Randy after the patient had left the office.  She will be instructed to increase her Cozaar to 50 mg twice daily and follow-up in his office in 1 week for repeat blood pressure check.  The patient is aware to call back for any questions or new concerns.     HPI, assessment, plan and physical exam has been scribed under the direction and in the presence of Robert Bellow, MD. Karie Fetch, RN  HPI, Physical Exam, Assessment and Plan have been scribed under the direction and in the presence of Hervey Ard, MD.  Gaspar Cola, CMA  I have completed the exam and reviewed the above documentation for accuracy and completeness.  I agree with the above.  Haematologist has been used and any errors in dictation or transcription are unintentional.  Hervey Ard, M.D., F.A.C.S.  Forest Gleason Alexcis Bicking 02/03/2019, 10:00 AM

## 2019-02-03 ENCOUNTER — Telehealth: Payer: Self-pay | Admitting: General Surgery

## 2019-02-03 ENCOUNTER — Other Ambulatory Visit: Payer: Self-pay | Admitting: Family Medicine

## 2019-02-03 NOTE — Telephone Encounter (Signed)
Noted. Let's call and schedule her a BP f/u visit

## 2019-02-03 NOTE — Telephone Encounter (Signed)
I think this nice lady now sees you.  I doubled her Cozaar from elevated blood pressure with Dr. Tollie Pizza this week.  May be follow-up with you next week.  I am happy to see her if necessary.

## 2019-02-03 NOTE — Telephone Encounter (Signed)
Patient scheduled for 02/17/2019.

## 2019-02-03 NOTE — Telephone Encounter (Signed)
I spoke with Dr. Rosanna Randy yesterday afternoon regarding the patient's blood pressure and her report of muscle aches from the calcium channel blocker.  He is recommended that she make use of Cozaar, 50 mg twice a day.  The patient was notified of this request and she has been asked to have her blood pressure rechecked in about 1 week in Dr. Alben Spittle office.

## 2019-02-07 ENCOUNTER — Other Ambulatory Visit: Payer: Self-pay | Admitting: Family Medicine

## 2019-02-08 ENCOUNTER — Other Ambulatory Visit: Payer: PPO

## 2019-02-08 NOTE — Telephone Encounter (Signed)
Pharmacy requesting refills. Thanks!  

## 2019-02-17 ENCOUNTER — Ambulatory Visit (INDEPENDENT_AMBULATORY_CARE_PROVIDER_SITE_OTHER): Payer: PPO | Admitting: Family Medicine

## 2019-02-17 ENCOUNTER — Encounter: Payer: Self-pay | Admitting: Family Medicine

## 2019-02-17 VITALS — BP 114/42 | HR 80

## 2019-02-17 DIAGNOSIS — I10 Essential (primary) hypertension: Secondary | ICD-10-CM

## 2019-02-17 MED ORDER — LOSARTAN POTASSIUM 50 MG PO TABS: 75.0000 mg | ORAL_TABLET | Freq: Every day | ORAL | 3 refills | Status: DC

## 2019-02-17 NOTE — Progress Notes (Signed)
Patient: Rita Cruz Female    DOB: 13-Feb-1941   78 y.o.   MRN: [id number] Visit Date: 02/17/2019  Today's Provider: [name], MD   Chief Complaint  Patient presents with  . Hypertension   Subjective:    Virtual Visit via Video Note  I connected with Rita Awe Vandenberghe on 02/17/19 at  2:40 PM EDT by a video enabled telemedicine application and verified that I am speaking with the correct person using two identifiers.   I discussed the limitations of evaluation and management by telemedicine and the availability of in person appointments. The patient expressed understanding and agreed to proceed. HPI  Hypertension, follow-up:  BP Readings from Last 3 Encounters:  02/17/19 (!) 114/42  02/02/19 (!) 165/95  01/26/19 (!) 126/53    She was last seen for hypertension 1 months ago.  BP at that visit was 159/74. Management changes since that visit include Dr Bary Castilla increased Losartan to 50mg  BID when BP was noted to be elevated post-op. Diltiazem was stopped during hospitalization earlier this month.   She reports good compliance with treatment. She is not having side effects.  She is exercising - walking She is adherent to low salt diet.   Outside blood pressures are not regularly checked. She is experiencing none.  Patient denies chest pain, chest pressure/discomfort, claudication, dyspnea, exertional chest pressure/discomfort, fatigue, irregular heart beat, lower extremity edema, near-syncope, orthopnea, palpitations, paroxysmal nocturnal dyspnea, syncope and tachypnea.  Last 2 mornings had a little "wooziness" when first getting out of bed. Resolved within <1 min.  Wasn't present today  Cardiovascular risk factors include advanced age (older than 61 for men, 63 for women), dyslipidemia and hypertension.  Use of agents associated with hypertension: none.     Weight trend: stable Wt Readings from Last 3 Encounters:  02/02/19 174 lb (78.9 kg)   01/23/19 176 lb 9.6 oz (80.1 kg)  01/20/19 176 lb 9.6 oz (80.1 kg)    Current diet: low salt   Daughter was tested for COVID19 earlier this week.  She has had no contact with daughter. ------------------------------------------------------------------------  Allergies  Allergen Reactions  . Percocet  [Oxycodone-Acetaminophen] Other (See Comments)    Hallucinations Hallucinations  . Levaquin [Levofloxacin In D5w] Diarrhea    Vaginal itching  . Lisinopril     cough  . Meloxicam     Upset stomach  . Penicillins Itching  . Preservision Areds 2 [Multiple Vitamins-Minerals] Nausea Only     Current Outpatient Medications:  .  acetaminophen (TYLENOL) 325 MG tablet, Take 650 mg by mouth every 6 (six) hours as needed for moderate pain., Disp: , Rfl:  .  ALPRAZolam (XANAX) 0.5 MG tablet, TAKE 1 TABLET BY MOUTH EVERY 12 HOURS AS NEEDED (Patient taking differently: Take 0.5 mg by mouth 2 (two) times daily. ), Disp: 60 tablet, Rfl: 3 .  aspirin (ASPIRIN EC LO-DOSE) 81 MG EC tablet, Take 81 mg by mouth at bedtime. , Disp: , Rfl:  .  baclofen (LIORESAL) 10 MG tablet, Take 1 tablet (10 mg total) by mouth 3 (three) times daily as needed for muscle spasms., Disp: 60 each, Rfl: 3 .  fluticasone (FLONASE) 50 MCG/ACT nasal spray, Place 2 sprays into both nostrils daily as needed for allergies., Disp: , Rfl:  .  ketotifen (ZADITOR) 0.025 % ophthalmic solution, Place 1 drop into both eyes daily as needed (allergies)., Disp: , Rfl:  .  losartan (COZAAR) 50 MG tablet, Take 2 tablets (100 mg total)  by mouth daily., Disp: 90 tablet, Rfl: 3 .  Multiple Vitamins-Minerals (PRESERVISION AREDS 2 PO), Take 1 tablet by mouth 2 (two) times daily., Disp: , Rfl:  .  omeprazole (PRILOSEC) 20 MG capsule, TAKE 1 CAPSULE BY MOUTH ONCE DAILY (Patient taking differently: Take 20 mg by mouth daily. ), Disp: 90 capsule, Rfl: 3 .  sertraline (ZOLOFT) 50 MG tablet, Take 1 tablet by mouth once daily, Disp: 90 tablet, Rfl: 3   Review of Systems  Constitutional: Negative.   Respiratory: Negative.   Cardiovascular: Negative.   Gastrointestinal: Negative.   Neurological: Negative.   Psychiatric/Behavioral: Negative.     Social History   Tobacco Use  . Smoking status: Never Smoker  . Smokeless tobacco: Never Used  . Tobacco comment: does not smoke  Substance Use Topics  . Alcohol use: Yes    Alcohol/week: 0.0 standard drinks    Comment: rarely - wine      Objective:   BP (!) 114/42   Pulse 80  Vitals:   02/17/19 1512 02/17/19 1523  BP: 137/63 (!) 114/42  Pulse: 79 80     Physical Exam Vitals signs reviewed.  Constitutional:      General: She is not in acute distress.    Appearance: She is not diaphoretic.  HENT:     Head: Normocephalic and atraumatic.  Eyes:     General: No scleral icterus. Cardiovascular:     Rate and Rhythm: Normal rate.  Pulmonary:     Effort: Pulmonary effort is normal. No respiratory distress.  Neurological:     Mental Status: She is alert and oriented to person, place, and time. Mental status is at baseline.  Psychiatric:        Mood and Affect: Mood normal.        Behavior: Behavior normal.         Assessment & Plan       Problem List Items Addressed This Visit      Cardiovascular and Mediastinum   Essential (primary) hypertension - Primary    Patient with initially elevated BP, but after settling for a few minutes it decreased to 114/42 She complains of some symptomatic orthostatic hypotensions in the mornings Will decrease losartan from 100 to 75mg  daily Ok to take in single dose Discussed warning signs and return precautons Continue to hold Diltiazem F/u in 3 months Check BMP at that time          Return in about 3 months (around 05/20/2019) for BP f/u.   The entirety of the information documented in the History of Present Illness, Review of Systems and Physical Exam were personally obtained by me. Portions of this information were  initially documented by Tiburcio Pea, CMA and reviewed by me for thoroughness and accuracy.   I discussed the assessment and treatment plan with the patient. The patient was provided an opportunity to ask questions and all were answered. The patient agreed with the plan and demonstrated an understanding of the instructions.   The patient was advised to call back or seek an in-person evaluation if the symptoms worsen or if the condition fails to improve as anticipated.  I provided 25 minutes of non-face-to-face time during this encounter.   Virginia Crews, MD, MPH [facility] 02/17/2019 3:28 PM

## 2019-02-17 NOTE — Patient Instructions (Signed)
Decrease losartan to 75mg  daily

## 2019-02-17 NOTE — Assessment & Plan Note (Signed)
Patient with initially elevated BP, but after settling for a few minutes it decreased to 114/42 She complains of some symptomatic orthostatic hypotensions in the mornings Will decrease losartan from 100 to 75mg  daily Ok to take in single dose Discussed warning signs and return precautons Continue to hold Diltiazem F/u in 3 months Check BMP at that time

## 2019-02-20 ENCOUNTER — Ambulatory Visit: Payer: Self-pay | Admitting: Family Medicine

## 2019-02-21 ENCOUNTER — Telehealth: Payer: Self-pay

## 2019-02-21 DIAGNOSIS — R921 Mammographic calcification found on diagnostic imaging of breast: Secondary | ICD-10-CM

## 2019-02-21 NOTE — Telephone Encounter (Signed)
Patient is requesting a order for a diagnostic mammogram be sent to [facility].

## 2019-02-23 NOTE — Telephone Encounter (Signed)
Looks liek it was recommended to have repeat diagnostic mammogram in 1 yr in 03/2018.  Order placed, but this will not be done for at least 1 month and may be pushed further pending status of pandemic.

## 2019-02-23 NOTE — Telephone Encounter (Signed)
Patient was advised.  

## 2019-02-27 ENCOUNTER — Other Ambulatory Visit: Payer: PPO

## 2019-02-28 ENCOUNTER — Telehealth (INDEPENDENT_AMBULATORY_CARE_PROVIDER_SITE_OTHER): Payer: PPO | Admitting: General Surgery

## 2019-02-28 ENCOUNTER — Other Ambulatory Visit: Payer: Self-pay

## 2019-02-28 DIAGNOSIS — D126 Benign neoplasm of colon, unspecified: Secondary | ICD-10-CM

## 2019-02-28 NOTE — Progress Notes (Signed)
Virtual Visit via Telephone Note  I connected with Rita Cruz on 02/28/19 at  9:45 AM EDT by telephone and verified that I am speaking with the correct person using two identifiers.   I discussed the limitations, risks, security and privacy concerns of performing an evaluation and management service by telephone and the availability of in person appointments. I also discussed with the patient that there may be a patient responsible charge related to this service. The patient expressed understanding and agreed to proceed.   History of Present Illness: The patient underwent a laparoscopically assisted right hemicolectomy for malignancy on January 23, 2019.  At the time of her first postoperative visit on February 02, 2019 she was doing well.  The patient recently saw her PCP on March 27.  Today's visit was to assess her progress post colectomy.  The patient reports that her stool frequency is slightly more than prior to surgery but under good control and trending back towards baseline.  She reports no dietary intolerance.  She reports no wound difficulties.  She reports that she is spending most of her time in the yard working in her garden.      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 Follow Up Instructions:  I anticipate she will be contacted by Dr. Gustavo Lah from gastroenterology for a follow-up exam in 1 year.  She was encouraged to contact the office if she had any clinical concerns.  Follow-up otherwise will be on an as-needed basis.   I discussed the assessment and treatment plan with the patient. The patient was provided an opportunity to ask questions and all were answered. The patient agreed with the plan and demonstrated an understanding of the instructions.   The patient was advised to call back or seek an in-person evaluation if the symptoms worsen or if the condition fails to improve as anticipated.  I  provided 5 minutes of non-face-to-face time during this encounter.   Robert Bellow, MD

## 2019-03-02 ENCOUNTER — Ambulatory Visit: Payer: PPO | Admitting: General Surgery

## 2019-04-26 DIAGNOSIS — Z86018 Personal history of other benign neoplasm: Secondary | ICD-10-CM | POA: Diagnosis not present

## 2019-04-26 DIAGNOSIS — L570 Actinic keratosis: Secondary | ICD-10-CM | POA: Diagnosis not present

## 2019-04-27 ENCOUNTER — Ambulatory Visit
Admission: RE | Admit: 2019-04-27 | Discharge: 2019-04-27 | Disposition: A | Discharge status: Home / Self Care | Payer: PPO | Source: Ambulatory Visit | Attending: Family Medicine | Admitting: Family Medicine

## 2019-04-27 ENCOUNTER — Other Ambulatory Visit: Payer: Self-pay

## 2019-04-27 DIAGNOSIS — Z13820 Encounter for screening for osteoporosis: Secondary | ICD-10-CM | POA: Diagnosis not present

## 2019-04-27 DIAGNOSIS — M8589 Other specified disorders of bone density and structure, multiple sites: Secondary | ICD-10-CM | POA: Diagnosis not present

## 2019-04-27 DIAGNOSIS — R921 Mammographic calcification found on diagnostic imaging of breast: Secondary | ICD-10-CM

## 2019-04-27 DIAGNOSIS — Z780 Asymptomatic menopausal state: Secondary | ICD-10-CM | POA: Diagnosis not present

## 2019-04-30 ENCOUNTER — Other Ambulatory Visit: Payer: Self-pay | Admitting: Family Medicine

## 2019-04-30 DIAGNOSIS — F418 Other specified anxiety disorders: Secondary | ICD-10-CM

## 2019-05-01 ENCOUNTER — Emergency Department
Admission: EM | Admit: 2019-05-01 | Discharge: 2019-05-01 | Disposition: A | Discharge status: Home / Self Care | Payer: PPO | Attending: Emergency Medicine | Admitting: Emergency Medicine

## 2019-05-01 ENCOUNTER — Emergency Department: Payer: PPO

## 2019-05-01 ENCOUNTER — Encounter: Payer: Self-pay | Admitting: Emergency Medicine

## 2019-05-01 ENCOUNTER — Other Ambulatory Visit: Payer: Self-pay

## 2019-05-01 ENCOUNTER — Ambulatory Visit (INDEPENDENT_AMBULATORY_CARE_PROVIDER_SITE_OTHER): Payer: PPO | Admitting: Physician Assistant

## 2019-05-01 VITALS — BP 177/85 | HR 119 | Temp 98.1°F | Ht 69.0 in | Wt 172.6 lb

## 2019-05-01 DIAGNOSIS — Z7982 Long term (current) use of aspirin: Secondary | ICD-10-CM | POA: Diagnosis not present

## 2019-05-01 DIAGNOSIS — Z880 Allergy status to penicillin: Secondary | ICD-10-CM | POA: Diagnosis not present

## 2019-05-01 DIAGNOSIS — Z733 Stress, not elsewhere classified: Secondary | ICD-10-CM | POA: Insufficient documentation

## 2019-05-01 DIAGNOSIS — F419 Anxiety disorder, unspecified: Secondary | ICD-10-CM | POA: Diagnosis not present

## 2019-05-01 DIAGNOSIS — I10 Essential (primary) hypertension: Secondary | ICD-10-CM | POA: Insufficient documentation

## 2019-05-01 DIAGNOSIS — R4182 Altered mental status, unspecified: Secondary | ICD-10-CM | POA: Diagnosis not present

## 2019-05-01 DIAGNOSIS — Z79899 Other long term (current) drug therapy: Secondary | ICD-10-CM | POA: Diagnosis not present

## 2019-05-01 DIAGNOSIS — S0990XA Unspecified injury of head, initial encounter: Secondary | ICD-10-CM | POA: Diagnosis not present

## 2019-05-01 DIAGNOSIS — R410 Disorientation, unspecified: Secondary | ICD-10-CM | POA: Diagnosis not present

## 2019-05-01 DIAGNOSIS — G459 Transient cerebral ischemic attack, unspecified: Secondary | ICD-10-CM | POA: Diagnosis not present

## 2019-05-01 LAB — COMPREHENSIVE METABOLIC PANEL
ALT: 12 U/L (ref 0–44)
AST: 15 U/L (ref 15–41)
Albumin: 4.4 g/dL (ref 3.5–5.0)
Alkaline Phosphatase: 80 U/L (ref 38–126)
Anion gap: 10 (ref 5–15)
BUN: 11 mg/dL (ref 8–23)
CO2: 23 mmol/L (ref 22–32)
Calcium: 9.4 mg/dL (ref 8.9–10.3)
Chloride: 107 mmol/L (ref 98–111)
Creatinine, Ser: 0.95 mg/dL (ref 0.44–1.00)
GFR calc Af Amer: >60 mL/min (ref >60)
GFR calc non Af Amer: 58 mL/min — ABNORMAL LOW (ref >60)
Glucose, Bld: 112 mg/dL — ABNORMAL HIGH (ref 70–99)
Potassium: 3.3 mmol/L — ABNORMAL LOW (ref 3.5–5.1)
Sodium: 140 mmol/L (ref 135–145)
Total Bilirubin: 0.9 mg/dL (ref 0.3–1.2)
Total Protein: 7.2 g/dL (ref 6.5–8.1)

## 2019-05-01 LAB — DIFFERENTIAL
Abs Immature Granulocytes: 0.05 10*3/uL (ref 0.00–0.07)
Basophils Absolute: 0 10*3/uL (ref 0.0–0.1)
Basophils Relative: 0 %
Eosinophils Absolute: 0 10*3/uL (ref 0.0–0.5)
Eosinophils Relative: 0 %
Immature Granulocytes: 0 %
Lymphocytes Relative: 6 %
Lymphs Abs: 0.8 10*3/uL (ref 0.7–4.0)
Monocytes Absolute: 0.6 10*3/uL (ref 0.1–1.0)
Monocytes Relative: 4 %
Neutro Abs: 11.8 10*3/uL — ABNORMAL HIGH (ref 1.7–7.7)
Neutrophils Relative %: 90 %

## 2019-05-01 LAB — URINALYSIS, COMPLETE (UACMP) WITH MICROSCOPIC
Bilirubin Urine: NEGATIVE
Glucose, UA: NEGATIVE mg/dL
Hgb urine dipstick: NEGATIVE
Ketones, ur: NEGATIVE mg/dL
Leukocytes,Ua: NEGATIVE
Nitrite: NEGATIVE
Protein, ur: NEGATIVE mg/dL
Specific Gravity, Urine: 1.011 (ref 1.005–1.030)
pH: 5 (ref 5.0–8.0)

## 2019-05-01 LAB — APTT: aPTT: 32 seconds (ref 24–36)

## 2019-05-01 LAB — CBC
HCT: 38.8 % (ref 36.0–46.0)
Hemoglobin: 12.8 g/dL (ref 12.0–15.0)
MCH: 28.6 pg (ref 26.0–34.0)
MCHC: 33 g/dL (ref 30.0–36.0)
MCV: 86.6 fL (ref 80.0–100.0)
Platelets: 253 10*3/uL (ref 150–400)
RBC: 4.48 MIL/uL (ref 3.87–5.11)
RDW: 13.2 % (ref 11.5–15.5)
WBC: 13.2 10*3/uL — ABNORMAL HIGH (ref 4.0–10.5)
nRBC: 0 % (ref 0.0–0.2)

## 2019-05-01 LAB — PROTIME-INR
INR: 1 (ref 0.8–1.2)
Prothrombin Time: 13.4 seconds (ref 11.4–15.2)

## 2019-05-01 LAB — GLUCOSE, CAPILLARY: Glucose-Capillary: 139 mg/dL — ABNORMAL HIGH (ref 70–99)

## 2019-05-01 MED ORDER — LORAZEPAM 1 MG PO TABS
1.0000 mg | ORAL_TABLET | Freq: Once | ORAL | Status: AC
  Administered 2019-05-01: 1 mg via ORAL
  Filled 2019-05-01: qty 1

## 2019-05-01 NOTE — ED Notes (Signed)
Sent rainbow to lab. 

## 2019-05-01 NOTE — Discharge Instructions (Addendum)
As we discussed please follow-up with your doctor tomorrow for recheck/reevaluation and to discuss further work-up.  Return to the emergency department for any weakness or numbness any further confusion trouble speaking or thinking.

## 2019-05-01 NOTE — Patient Instructions (Signed)
Ischemic Stroke  An ischemic stroke (cerebrovascular accident, or CVA) is the sudden death of brain tissue that occurs when an area of the brain does not get enough oxygen. It is a medical emergency that must be treated right away. An ischemic stroke can cause permanent loss of brain function. This can cause problems with how different parts of your body function. What are the causes? This condition is caused by a decrease of oxygen supply to an area of the brain, which may be the result of:  A small blood clot (embolus) or a buildup of plaque in the blood vessels (atherosclerosis) that blocks blood flow in the brain.  An abnormal heart rhythm (atrial fibrillation).  A blocked or damaged artery in the head or neck. Sometimes the cause of stroke is not known (cryptogenic). What increases the risk? Certain factors may make you more likely to develop this condition. Some of these factors are things that you can change, such as:  Obesity.  Smoking cigarettes.  Taking oral birth control, especially if you also use tobacco.  Physical inactivity.  Excessive alcohol use.  Use of illegal drugs, especially cocaine and methamphetamine. Other risk factors include:  High blood pressure (hypertension).  High cholesterol.  Diabetes mellitus.  Heart disease.  Being [place] American, Native American, Hispanic, or [place] Native.  Being over age 38.  Family history of stroke.  Previous history of blood clots, stroke, or transient ischemic attack (TIA).  Sickle cell disease.  Being a woman with a history of preeclampsia.  Migraine headache.  Sleep apnea.  Irregular heartbeats, such as atrial fibrillation.  Chronic inflammatory diseases, such as rheumatoid arthritis or lupus.  Blood clotting disorders (hypercoagulable state). What are the signs or symptoms? Symptoms of this condition usually develop suddenly, or you may notice them after waking up from sleep. Symptoms may include  sudden:  Weakness or numbness in your face, arm, or leg, especially on one side of your body.  Trouble walking or difficulty moving your arms or legs.  Loss of balance or coordination.  Confusion.  Slurred speech (dysarthria).  Trouble speaking, understanding speech, or both (aphasia).  Vision changes-such as double vision, blurred vision, or loss of vision-in one or both eyes.  Dizziness.  Nausea and vomiting.  Severe headache with no known cause. The headache is often described as the worst headache ever experienced. If possible, make note of the exact time that you last felt like your normal self and what time your symptoms started. Tell your health care provider. If symptoms come and go, this could be a sign of a warning stroke, or TIA. Get help right away, even if you feel better. How is this diagnosed? This condition may be diagnosed based on:  Your symptoms, your medical history, and a physical exam.  CT scan of the brain.  MRI.  CT angiogram. This test uses a computer to take X-rays of your arteries. A dye may be injected into your blood to show the inside of your blood vessels more clearly.  MRI angiogram. This is a type of MRI that is used to evaluate the blood vessels.  Cerebral angiogram. This test uses X-rays and a dye to show the blood vessels in the brain and neck. You may need to see a health care provider who specializes in stroke care. A stroke specialist can be seen in person or through communication using telephone or television technology (telemedicine). Other tests may also be done to find the cause of the stroke, such  as:  Electrocardiogram (ECG).  Continuous heart monitoring.  Echocardiogram.  Transesophageal echocardiogram (TEE).  Carotid ultrasound.  A scan of the brain circulation.  Blood tests.  Sleep study to check for sleep apnea. How is this treated? Treatment for this condition will depend on the duration, severity, and cause of  your symptoms and on the area of the brain affected. It is very important to get treatment at the first sign of stroke symptoms. Some treatments work better if they are done within 3-6 hours of the onset of stroke symptoms. These initial treatments may include:  Aspirin.  Medicines to control blood pressure.  Medicine given by injection to dissolve the blood clot (thrombolytic).  Treatments given directly to the affected artery to remove or dissolve the blood clot. Other treatment options may include:  Oxygen.  IV fluids.  Medicines to thin the blood (anticoagulants or antiplatelets).  Procedures to increase blood flow. Medicines and changes to your diet may be used to help treat and manage risk factors for stroke, such as diabetes, high cholesterol, and high blood pressure. After a stroke, you may work with physical, speech, mental health, or occupational therapists to help you recover. Follow these instructions at home: Medicines  Take over-the-counter and prescription medicines only as told by your health care provider.  If you were told to take a medicine to thin your blood, such as aspirin or an anticoagulant, take it exactly as told by your health care provider. ? Taking too much blood-thinning medicine can cause bleeding. ? If you do not take enough blood-thinning medicine, you will not have the protection that you need against another stroke and other problems.  Understand the side effects of taking anticoagulant medicine. When taking this type of medicine, make sure you: ? Hold pressure over any cuts for longer than usual. ? Tell your dentist and other health care providers that you are taking anticoagulants before you have any procedures that may cause bleeding. ? Avoid activities that may cause trauma or injury. Eating and drinking  Follow instructions from your health care provider about diet.  Eat healthy foods.  If your ability to swallow was affected by the  stroke, you may need to take steps to avoid choking, such as: ? Taking small bites when eating. ? Eating foods that are soft or pureed. Safety  Follow instructions from your health care team about physical activity.  Use a walker or cane as told by your health care provider.  Take steps to create a safe home environment in order to reduce the risk of falls. This may include: ? Having your home looked at by specialists. ? Installing grab bars in the bedroom and bathroom. ? Using safety equipment, such as raised toilets and a seat in the shower. General instructions  Do not use any tobacco products, such as cigarettes, chewing tobacco, and e-cigarettes. If you need help quitting, ask your health care provider.  Limit alcohol intake to no more than 1 drink a day for nonpregnant women and 2 drinks a day for men. One drink equals 12 oz of beer, 5 oz of wine, or 1 oz of hard liquor.  If you need help to stop using drugs or alcohol, ask your health care provider about a referral to a program or specialist.  Maintain an active and healthy lifestyle. Get regular exercise as told by your health care provider.  Keep all follow-up visits as told by your health care provider, including visits with all specialists on your  health care team. This is important. How is this prevented? Your risk of another stroke can be decreased by managing high blood pressure, high cholesterol, diabetes, heart disease, sleep apnea, and obesity. It can also be decreased by quitting smoking, limiting alcohol, and staying physically active. Your health care provider will continue to work with you on measures to prevent short-term and long-term complications of stroke. Get help right away if:   You have any symptoms of a stroke. "BE FAST" is an easy way to remember the main warning signs of a stroke: ? B - Balance. Signs are dizziness, sudden trouble walking, or loss of balance. ? E - Eyes. Signs are trouble seeing or a  sudden change in vision. ? F - Face. Signs are sudden weakness or numbness of the face, or the face or eyelid drooping on one side. ? A - Arms. Signs are weakness or numbness in an arm. This happens suddenly and usually on one side of the body. ? S - Speech. Signs are sudden trouble speaking, slurred speech, or trouble understanding what people say. ? T - Time. Time to call emergency services. Write down what time symptoms started.  You have other signs of a stroke, such as: ? A sudden, severe headache with no known cause. ? Nausea or vomiting. ? Seizure.  These symptoms may represent a serious problem that is an emergency. Do not wait to see if the symptoms will go away. Get medical help right away. Call your local emergency services (911 in the [place] Do not drive yourself to the hospital. Summary  An ischemic stroke (cerebrovascular accident, or CVA) is the sudden death of brain tissue that occurs when an area of the brain does not get enough oxygen.  Symptoms of this condition usually develop suddenly, or you may notice them after waking up from sleep.  It is very important to get treatment at the first sign of stroke symptoms. Stroke is a medical emergency that must be treated right away. This information is not intended to replace advice given to you by your health care provider. Make sure you discuss any questions you have with your health care provider. Document Released: 11/09/2005 Document Revised: 07/29/2018 Document Reviewed: 02/05/2016 Elsevier Interactive Patient Education  2019 [employer].

## 2019-05-01 NOTE — Progress Notes (Signed)
0      Patient: Rita Cruz Female    DOB: 1941/10/29   78 y.o.   MRN: [id number] Visit Date: 05/01/2019  Today's Provider: Trinna Post, PA-C   Chief Complaint  Patient presents with  . memory issues since 04/29/19   Subjective:     HPI Pt with history of HTN reports that she woke up this morning at 11:00 AM and felt her memory was not good. She reports she was talking to her friend on the phone and she didn't have any idea what she was talking about. Then she went to take her medications and she reports it took her a long time to do so because she couldn't figure out which ones to take. Her daughter presents with her today and reports that this is very atypical for her mother and that her mother actually keeps track of her husband's own medications. Daughter reports her mother called her at 12:30 PM to report some memory issues and that she was advised to make an appointment ASAP. She denies loss of vision, one sided weakness, trouble understanding or forming speech, nausea, vomiting, falling.   Pt did have a fall last week and fell towards the TV.  She had told her daughter that she bumped the TV stand and she has bruises on her left hand and arm and her back.  She adv she had a blood vessel busted in her right eye.  Daughter advised pt had on flip flops and were to big and they had moved to the side and that is what made her fall. She reports she did not hit her head.    Allergies  Allergen Reactions  . Percocet  [Oxycodone-Acetaminophen] Other (See Comments)    Hallucinations Hallucinations  . Levaquin [Levofloxacin In D5w] Diarrhea    Vaginal itching  . Lisinopril     cough  . Meloxicam     Upset stomach  . Penicillins Itching  . Preservision Areds 2 [Multiple Vitamins-Minerals] Nausea Only     Current Outpatient Medications:  .  acetaminophen (TYLENOL) 325 MG tablet, Take 650 mg by mouth every 6 (six) hours as needed for moderate pain., Disp: , Rfl:  .   ALPRAZolam (XANAX) 0.5 MG tablet, TAKE 1 TABLET BY MOUTH EVERY 12 HOURS AS NEEDED, Disp: 60 tablet, Rfl: 0 .  aspirin (ASPIRIN EC LO-DOSE) 81 MG EC tablet, Take 81 mg by mouth at bedtime. , Disp: , Rfl:  .  losartan (COZAAR) 50 MG tablet, Take 1.5 tablets (75 mg total) by mouth daily., Disp: 45 tablet, Rfl: 3 .  Multiple Vitamins-Minerals (PRESERVISION AREDS 2 PO), Take 1 tablet by mouth 2 (two) times daily., Disp: , Rfl:  .  omeprazole (PRILOSEC) 20 MG capsule, TAKE 1 CAPSULE BY MOUTH ONCE DAILY (Patient taking differently: Take 20 mg by mouth daily. ), Disp: 90 capsule, Rfl: 3 .  sertraline (ZOLOFT) 50 MG tablet, Take 1 tablet by mouth once daily, Disp: 90 tablet, Rfl: 3 .  baclofen (LIORESAL) 10 MG tablet, Take 1 tablet (10 mg total) by mouth 3 (three) times daily as needed for muscle spasms. (Patient not taking: Reported on 05/01/2019), Disp: 60 each, Rfl: 3 .  fluticasone (FLONASE) 50 MCG/ACT nasal spray, Place 2 sprays into both nostrils daily as needed for allergies., Disp: , Rfl:  .  ketotifen (ZADITOR) 0.025 % ophthalmic solution, Place 1 drop into both eyes daily as needed (allergies)., Disp: , Rfl:   Review of Systems  Constitutional: Negative.  HENT: Negative.   Eyes: Negative.   Respiratory: Negative.   Cardiovascular: Negative.   Gastrointestinal: Negative.   Endocrine: Negative.   Genitourinary: Negative.   Musculoskeletal: Positive for back pain.  Skin: Negative.   Allergic/Immunologic: Negative.   Neurological: Positive for dizziness.  Hematological: Negative.   Psychiatric/Behavioral: The patient is nervous/anxious.     Social History   Tobacco Use  . Smoking status: Never Smoker  . Smokeless tobacco: Never Used  . Tobacco comment: does not smoke  Substance Use Topics  . Alcohol use: Yes    Alcohol/week: 0.0 standard drinks    Comment: rarely - wine      Objective:   BP (!) 177/85 (BP Location: Left Arm, Patient Position: Sitting, Cuff Size: Large)   Pulse  (!) 119   Temp 98.1 F (36.7 C) (Oral)   Ht 5\' 9"  (1.753 m)   Wt 172 lb 9.6 oz (78.3 kg)   SpO2 97%   BMI 25.49 kg/m  Vitals:   05/01/19 1421  BP: (!) 177/85  Pulse: (!) 119  Temp: 98.1 F (36.7 C)  TempSrc: Oral  SpO2: 97%  Weight: 172 lb 9.6 oz (78.3 kg)  Height: 5\' 9"  (1.753 m)     Physical Exam Constitutional:      Appearance: Normal appearance.  Skin:    General: Skin is warm and dry.  Neurological:     Mental Status: She is alert.  Psychiatric:        Mood and Affect: Mood is anxious.         Assessment & Plan    1. Confusion  Concerned for stroke. Last normal 11:00 AM. Directed immediately to [facility] and have called ahead to triage nurse.   The entirety of the information documented in the History of Present Illness, Review of Systems and Physical Exam were personally obtained by me. Portions of this information were initially documented by Edd Arbour, CMA and reviewed by me for thoroughness and accuracy.   F/u PRN        Trinna Post, PA-C  [facility]

## 2019-05-01 NOTE — Telephone Encounter (Signed)
I think she sees you now. 

## 2019-05-01 NOTE — ED Triage Notes (Signed)
Says when she woke this am she felt she was forgetful.  Says she normally takes care of medications for she and  Her husband and she couldn't remember which ones to take this am.  Says something just isnt right.

## 2019-05-01 NOTE — ED Provider Notes (Signed)
[facility] Emergency Department Provider Note  Time seen: 9:34 PM  I have reviewed the triage vital signs and the nursing notes.   HISTORY  Chief Complaint Altered Mental Status    HPI Rita Cruz is a 78 y.o. female with a past medical history of anxiety, chest pain, gastric reflux, hypertension, hyperlipidemia presents to the emergency department for memory impairment.  According to the patient this morning she was having some difficulty remembering which pills to take as well as how she got a scrape on her left arm.  Patient states she knows now which pills to take as well as how she got a scrape on her left arm as she cut it on a nail by accident (states tetanus is up-to-date).  Patient states she has been under an extreme amount of stress and anxiety recently.  States her husband has not been doing very well since his bypass in August, she has had health issues with her colon and intestines since March.  Patient is quite anxious during my evaluation, very fidgety.  Patient denies any weakness or numbness of any arm or leg any difficulty speaking.  No history of CVA.  No headache.   Past Medical History:  Diagnosis Date  . Adenomatous polyps   . Anxiety   . Arthritis    neck  . Capsulitis of toe of right foot   . Chest pain    Adenosine Myoview in 2006 done for atypical chest pain, showed no eviden e for ischemia or infarct  . Colon polyps   . GERD (gastroesophageal reflux disease)   . History of colonic polyps   . HTN (hypertension)   . Hyperlipidemia   . PUD (peptic ulcer disease)    with hx of H. Pylori    Patient Active Problem List   Diagnosis Date Noted  . 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 Adenomatous polyp of ascending colon 01/18/2019  . Bronchitis 02/04/2018  . Microcalcification of both breasts on mammogram 02/04/2018  . Allergic rhinitis 05/23/2015  . Arthropathy, lower leg 05/23/2015  . Benign neoplasm of  colon 05/23/2015  . Clinical depression 05/23/2015  . Essential (primary) hypertension 05/23/2015  . Acid reflux 05/23/2015  . Anxiety, generalized 05/23/2015  . Hypercholesteremia 05/23/2015  . Blood glucose elevated 05/23/2015  . Symptomatic menopausal or female climacteric states 05/23/2015  . Arthritis, degenerative 05/23/2015  . Mixed incontinence 03/24/2013    Past Surgical History:  Procedure Laterality Date  . ABDOMINAL HYSTERECTOMY    . BREAST BIOPSY Right 2011   benign mass done in Dr. [place] office  . BREAST BIOPSY Right 06/29/2015   top hat marker. PROLIFERATIVE FIBROCYSTIC CHANGE WITH ASSOCIATED   . CERVICAL DISCECTOMY    . CHOLECYSTECTOMY    . COLONOSCOPY WITH PROPOFOL N/A 10/06/2016   Procedure: COLONOSCOPY WITH PROPOFOL;  Surgeon: Lollie Sails, MD;  Location: [facility] ENDOSCOPY;  Service: Endoscopy;  Laterality: N/A;  . COLONOSCOPY WITH PROPOFOL N/A 12/30/2018   Procedure: COLONOSCOPY WITH PROPOFOL;  Surgeon: Lollie Sails, MD;  Location: [facility] ENDOSCOPY;  Service: Endoscopy;  Laterality: N/A;  . EYE SURGERY    . hysterectomy-unspecified area    . LAPAROSCOPIC RIGHT COLECTOMY Right 01/23/2019   Procedure: LAPAROSCOPIC RIGHT COLECTOMY;  Surgeon: Robert Bellow, MD;  Location: [facility] ORS;  Service: General;  Laterality: Right;    Prior to Admission medications   Medication Sig Start Date End Date Taking? Authorizing Provider  acetaminophen (TYLENOL) 325 MG tablet Take 650 mg  by mouth every 6 (six) hours as needed for moderate pain.    [provider]  ALPRAZolam Duanne Moron) 0.5 MG tablet TAKE 1 TABLET BY MOUTH EVERY 12 HOURS AS NEEDED 05/01/19   Bacigalupo, Dionne Bucy, MD  aspirin (ASPIRIN EC LO-DOSE) 81 MG EC tablet Take 81 mg by mouth at bedtime.  09/25/10   [provider]  baclofen (LIORESAL) 10 MG tablet Take 1 tablet (10 mg total) by mouth 3 (three) times daily as needed for muscle spasms. Patient not taking: Reported on 05/01/2019 12/23/18    Virginia Crews, MD  fluticasone [facility]) 50 MCG/ACT nasal spray Place 2 sprays into both nostrils daily as needed for allergies.    [provider]  ketotifen (ZADITOR) 0.025 % ophthalmic solution Place 1 drop into both eyes daily as needed (allergies).    [provider]  losartan (COZAAR) 50 MG tablet Take 1.5 tablets (75 mg total) by mouth daily. 02/17/19   Virginia Crews, MD  Multiple Vitamins-Minerals (PRESERVISION AREDS 2 PO) Take 1 tablet by mouth 2 (two) times daily.    [provider]  omeprazole (PRILOSEC) 20 MG capsule TAKE 1 CAPSULE BY MOUTH ONCE DAILY Patient taking differently: Take 20 mg by mouth daily.  10/24/18   Jerrol Banana., MD  sertraline (ZOLOFT) 50 MG tablet Take 1 tablet by mouth once daily 02/08/19   Jerrol Banana., MD    Allergies  Allergen Reactions  . Percocet  [Oxycodone-Acetaminophen] Other (See Comments)    Hallucinations Hallucinations  . Levaquin [Levofloxacin In D5w] Diarrhea    Vaginal itching  . Lisinopril     cough  . Meloxicam     Upset stomach  . Penicillins Itching  . Preservision Areds 2 [Multiple Vitamins-Minerals] Nausea Only    Family History  Problem Relation Age of Onset  . Kidney failure Father   . Heart failure Father   . Diabetes Father   . COPD Mother   . Colon cancer Mother 63  . Heart attack Mother   . Ulcers Mother   . Osteoporosis Mother   . Macular degeneration Mother   . Hypertension Brother   . Irritable bowel syndrome Daughter   . Colon polyps Daughter   . Kidney Stones Son   . Bipolar disorder Son   . Melanoma Son        stage IV  . Hypertension Son   . Kidney cancer Other   . Breast cancer Neg Hx     Social History Social History   Tobacco Use  . Smoking status: Never Smoker  . Smokeless tobacco: Never Used  . Tobacco comment: does not smoke  Substance Use Topics  . Alcohol use: Yes    Alcohol/week: 0.0 standard drinks    Comment: rarely - wine   . Drug use: No    Review of Systems Constitutional: Negative for fever. ENT: Negative for recent illness/congestion Cardiovascular: Negative for chest pain. Respiratory: Negative for shortness of breath. Gastrointestinal: Negative for abdominal pain, vomiting  Genitourinary: Negative for urinary compaints Musculoskeletal: Negative for musculoskeletal complaints Skin: Negative for skin complaints  Neurological: Negative for headache.  Denies weakness or numbness.  States she was having trouble thinking this morning. All other ROS negative  ____________________________________________   PHYSICAL EXAM:  VITAL SIGNS: ED Triage Vitals  Enc Vitals Group     BP 05/01/19 1507 (!) 162/101     Pulse Rate 05/01/19 1507 (!) 110     Resp 05/01/19 1507 16  Temp 05/01/19 1507 98.5 F (36.9 C)     Temp Source 05/01/19 1504 Oral     SpO2 05/01/19 1507 95 %     Weight 05/01/19 1505 171 lb 15.3 oz (78 kg)     Height 05/01/19 1505 5\' 9"  (1.753 m)     Head Circumference --      Peak Flow --      Pain Score 05/01/19 1505 0     Pain Loc --      Pain Edu? --      Excl. in [place]? --     Constitutional: Alert and oriented. Well appearing, but anxious.  Patient very fidgety and admits to significant anxiety. Eyes: Normal exam ENT      Head: Normocephalic and atraumatic.      Mouth/Throat: Mucous membranes are moist. Cardiovascular: Normal rate, regular rhythm. Respiratory: Normal respiratory effort without tachypnea nor retractions. Breath sounds are clear Gastrointestinal: Soft and nontender. No distention.   Musculoskeletal: Nontender with normal range of motion in all extremities.  Neurologic:  Normal speech and language. No gross focal neurologic deficits  Skin:  Skin is warm, dry and intact.  Psychiatric: Mood and affect are normal.   ____________________________________________    RADIOLOGY  CT head negative ____________________________________________   INITIAL IMPRESSION  / ASSESSMENT AND PLAN / ED COURSE  Pertinent labs & imaging results that were available during my care of the patient were reviewed by me and considered in my medical decision making (see chart for details).   Patient presents emergency department with confusion this morning.  States she cannot remember which medicines she was supposed to take as well as how she got a scrape on her arm.  Patient states she was very anxious this morning and has been under a lot of stress recently per patient has not been sleeping much at night.  Patient denies any weakness or numbness at any point, denies any headache or difficulty speaking.  Overall the patient appears well she is anxious, but has a normal neurological exam including 5/5 motor in all extremities, equal grip strengths, no pronator drift, no cranial nerve deficits.  Patient's work-up is reassuring.  Lab work is largely within normal limits with a slight leukocytosis.  CT scan of the head is negative.  Urinalysis was normal.  Patient was given 1 mg Ativan in the emergency department.  Patient is much more calm after Ativan, continues to appear extremely well, no difficulty speaking, thinking or conveying her thoughts.  Continues to deny any weakness or numbness.  Is not entirely clear the cause of this morning's memory deficit could be anxiety related or could be related to a TIA or mini stroke.  I discussed with the patient following up with her primary care doctor first thing in the morning to arrange for further work-up.  I also discussed very strict return precautions.  Patient agreeable to plan of care.  Jasmine Awe Laye was evaluated in Emergency Department on 05/01/2019 for the symptoms described in the history of present illness. She was evaluated in the context of the global COVID-19 pandemic, which necessitated consideration that the patient might be at risk for infection with the SARS-CoV-2 virus that causes COVID-19. Institutional protocols and  algorithms that pertain to the evaluation of patients at risk for COVID-19 are in a state of rapid change based on information released by regulatory bodies including the CDC and federal and state organizations. These policies and algorithms were followed during the patient's care in the  ED.  ____________________________________________   FINAL CLINICAL IMPRESSION(S) / ED DIAGNOSES  Anxiety TIA   Harvest Dark, MD 05/01/19 2139

## 2019-05-01 NOTE — ED Notes (Signed)
Pt denies feeling confused like she was this morning when she woke up (and the reason she sought treatment today in the ED). Pt is able to correctly state her name and DOB; correct month, day of the week, and year, as well as the name of the current President of the [place]. She is also able to state her husbands and daughter's name, and provide their cell phone numbers.

## 2019-05-01 NOTE — Telephone Encounter (Signed)
Please review

## 2019-05-01 NOTE — ED Notes (Signed)
Report given to Daniel RN

## 2019-05-02 ENCOUNTER — Ambulatory Visit (INDEPENDENT_AMBULATORY_CARE_PROVIDER_SITE_OTHER): Payer: PPO | Admitting: Physician Assistant

## 2019-05-02 ENCOUNTER — Encounter: Payer: Self-pay | Admitting: Physician Assistant

## 2019-05-02 VITALS — BP 123/74 | HR 94 | Temp 97.7°F | Resp 16 | Ht 69.0 in | Wt 171.0 lb

## 2019-05-02 DIAGNOSIS — F419 Anxiety disorder, unspecified: Secondary | ICD-10-CM

## 2019-05-02 DIAGNOSIS — R404 Transient alteration of awareness: Secondary | ICD-10-CM

## 2019-05-02 DIAGNOSIS — R195 Other fecal abnormalities: Secondary | ICD-10-CM

## 2019-05-02 DIAGNOSIS — R3989 Other symptoms and signs involving the genitourinary system: Secondary | ICD-10-CM

## 2019-05-02 DIAGNOSIS — N814 Uterovaginal prolapse, unspecified: Secondary | ICD-10-CM

## 2019-05-02 DIAGNOSIS — E860 Dehydration: Secondary | ICD-10-CM | POA: Diagnosis not present

## 2019-05-02 DIAGNOSIS — Z6825 Body mass index (BMI) 25.0-25.9, adult: Secondary | ICD-10-CM

## 2019-05-02 MED ORDER — DOXYCYCLINE HYCLATE 100 MG PO TABS: 100.0000 mg | ORAL_TABLET | Freq: Two times a day (BID) | ORAL | 0 refills | Status: DC

## 2019-05-02 NOTE — Patient Instructions (Signed)
Confusion °Confusion is the inability to think with the usual speed or clarity. People who are confused often describe their thinking as cloudy or unclear. Confusion can also include feeling disoriented. This means you are unaware of where you are or who you are. You may also not know the date or time. When confused, you may have difficulty remembering, paying attention, or making decisions. Some people also act aggressively when they are confused. °In some cases, confusion may come on quickly. In other cases, it may develop slowly over time. How quickly confusion comes on depends on the cause. °Confusion may be caused by: °· Head injury (concussion). °· Seizures. °· Stroke. °· Fever. °· Brain tumor. °· Decrease in brain function due to a vascular or neurologic condition (dementia). °· Emotions, like rage or terror. °· Inability to know what is real and what is not (hallucinations). °· Infections, such as a urinary tract infection (UTI). °· Using too much alcohol, drugs, or medicines. °· Loss of fluid (dehydration) or an imbalance of salts in the body (electrolytes). °· Lack of sleep. °· Low blood sugar (diabetes). °· Low levels of oxygen. This comes from conditions such as chronic lung disorders. °· Side effects of medicines, or taking medicines that affect other medicines (drug interactions). °· Lack of certain nutrients, especially niacin, thiamine, vitamin C, or vitamin B. °· Sudden drop in body temperature (hypothermia). °· Change in routine, such as traveling or being hospitalized. °Follow these instructions at home: °Pay attention to your symptoms. Tell your health care provider about any changes or if you develop new symptoms. Follow these instructions to control or treat symptoms. Ask a family member or friend for help if needed. °Medicines °· Take over-the-counter and prescription medicines only as told by your health care provider. °· Ask your health care provider about changing or stopping any medicines  that may be causing your confusion. °· Avoid pain medicines or sleep medicines until you have fully recovered. °· Use a pillbox or an alarm to help you take the right medicines at the right time. °Lifestyle ° °· Eat a balanced diet that includes fruits and vegetables. °· Get enough sleep. For most adults, this is 7-9 hours each night. °· Do not drink alcohol. °· Do not become isolated. Spend time with other people and make plans for your days. °· Do not drive until your health care provider says that it is safe to do so. °· Do not use any products that contain nicotine or tobacco, such as cigarettes and e-cigarettes. If you need help quitting, ask your health care provider. °· Stop other activities that may increase your chances of getting hurt. These may include some work duties, sports activities, swimming, or bike riding. Ask your health care provider what activities are safe for you. °What caregivers can do °· Find out if the person is confused. Ask the person to state his or her name, age, and the date. If the person is unsure or answers incorrectly, he or she may be confused. °· Always introduce yourself, no matter how well the person knows you. °· Remind the person of his or her location. Do this often. °· Place a calendar and clock near the person who is confused. °· Talk about current events and plans for the day. °· Keep the environment calm, quiet, and peaceful. °· Help the person do the things that he or she is unable to do. These include: °? Taking medicines. °? Keeping follow-up visits with his or her health care   provider. °? Helping with household duties, including meal preparation. °? Running errands. °· Get help if you need it. There are several support groups for caregivers. °· If the person you are helping needs more support, consider day care, extended care programs, or a skilled nursing facility. The person's health care provider may be able to help evaluate these options. °General  instructions °· Monitor yourself for any conditions you may have. These may include: °? Checking your blood glucose levels, if you have diabetes. °? Watching your weight, if you are overweight. °? Monitoring your blood pressure, if you have hypertension. °? Monitoring your body temperature, if you have a fever. °· Keep all follow-up visits as told by your health care provider. This is important. °Contact a health care provider if: °· Your symptoms get worse. °Get help right away if you: °· Feel that you are not able to care for yourself. °· Develop severe headaches, repeated vomiting, seizures, blackouts, or slurred speech. °· Have increasing confusion, weakness, numbness, restlessness, or personality changes. °· Develop a loss of balance, have marked dizziness, feel uncoordinated, or fall. °· Develop severe anxiety, or you have delusions or hallucinations. °These symptoms may represent a serious problem that is an emergency. Do not wait to see if the symptoms will go away. Get medical help right away. Call your local emergency services (911 in the [place] Do not drive yourself to the hospital. °Summary °· Confusion is the inability to think with the usual speed or clarity. People who are confused often describe their thinking as cloudy or unclear. °· Confusion can also include having difficulty remembering, paying attention, or making decisions. °· Confusion may come on quickly or develop slowly over time, depending on the cause. There are many different causes of confusion. °· Ask for help from family members or friends if you are unable to take care of yourself. °This information is not intended to replace advice given to you by your health care provider. Make sure you discuss any questions you have with your health care provider. °Document Released: 12/17/2004 Document Revised: 11/11/2017 Document Reviewed: 11/11/2017 °[employer] Interactive Patient Education © 2019 [employer] ° °

## 2019-05-02 NOTE — Progress Notes (Signed)
Patient: Rita Cruz Female    DOB: 03-Feb-1941   78 y.o.   MRN: [id number] Visit Date: 05/02/2019  Today's Provider: Mar Daring, PA-C   Chief Complaint  Patient presents with  . Hospitalization Follow-up   Subjective:     HPI  Follow up ER visit  Patient was seen in ER for confusion on 05/01/2019. She was treated for altered mental status. She underwent labs and CT head. All work up was unremarkable. Treatment for this included no changes to medications. She was given Ativan 1mg  and symptoms improved with reassurance and of normal testing and the ativan. She reports good compliance with treatment. She reports this condition is Improved. -----------------------------------------------------------------  Patient states she feels better since ED visit. However she still has a headache.   Allergies  Allergen Reactions  . Percocet  [Oxycodone-Acetaminophen] Other (See Comments)    Hallucinations Hallucinations  . Levaquin [Levofloxacin In D5w] Diarrhea    Vaginal itching  . Lisinopril     cough  . Meloxicam     Upset stomach  . Penicillins Itching  . Preservision Areds 2 [Multiple Vitamins-Minerals] Nausea Only     Current Outpatient Medications:  .  acetaminophen (TYLENOL) 325 MG tablet, Take 650 mg by mouth every 6 (six) hours as needed for moderate pain., Disp: , Rfl:  .  ALPRAZolam (XANAX) 0.5 MG tablet, TAKE 1 TABLET BY MOUTH EVERY 12 HOURS AS NEEDED, Disp: 60 tablet, Rfl: 0 .  aspirin (ASPIRIN EC LO-DOSE) 81 MG EC tablet, Take 81 mg by mouth at bedtime. , Disp: , Rfl:  .  fluticasone (FLONASE) 50 MCG/ACT nasal spray, Place 2 sprays into both nostrils daily as needed for allergies., Disp: , Rfl:  .  ketotifen (ZADITOR) 0.025 % ophthalmic solution, Place 1 drop into both eyes daily as needed (allergies)., Disp: , Rfl:  .  losartan (COZAAR) 50 MG tablet, Take 1.5 tablets (75 mg total) by mouth daily., Disp: 45 tablet, Rfl: 3 .  Multiple  Vitamins-Minerals (PRESERVISION AREDS 2 PO), Take 1 tablet by mouth 2 (two) times daily., Disp: , Rfl:  .  omeprazole (PRILOSEC) 20 MG capsule, TAKE 1 CAPSULE BY MOUTH ONCE DAILY (Patient taking differently: Take 20 mg by mouth daily. ), Disp: 90 capsule, Rfl: 3 .  sertraline (ZOLOFT) 50 MG tablet, Take 1 tablet by mouth once daily, Disp: 90 tablet, Rfl: 3 .  baclofen (LIORESAL) 10 MG tablet, Take 1 tablet (10 mg total) by mouth 3 (three) times daily as needed for muscle spasms. (Patient not taking: Reported on 05/01/2019), Disp: 60 each, Rfl: 3  Review of Systems  Constitutional: Positive for fatigue.  Respiratory: Negative.   Cardiovascular: Negative.   Genitourinary: Positive for vaginal discharge.  Musculoskeletal: Negative.   Neurological: Positive for headaches. Negative for dizziness.  Psychiatric/Behavioral: Positive for confusion (improved today).    Social History   Tobacco Use  . Smoking status: Never Smoker  . Smokeless tobacco: Never Used  . Tobacco comment: does not smoke  Substance Use Topics  . Alcohol use: Yes    Alcohol/week: 0.0 standard drinks    Comment: rarely - wine      Objective:   BP 123/74 (BP Location: Left Arm, Patient Position: Sitting, Cuff Size: Large)   Pulse 94   Temp 97.7 F (36.5 C) (Oral)   Resp 16   Ht 5\' 9"  (1.753 m)   Wt 171 lb (77.6 kg)   SpO2 98%   BMI 25.25 kg/m  Vitals:   05/02/19 1524  BP: 123/74  Pulse: 94  Resp: 16  Temp: 97.7 F (36.5 C)  TempSrc: Oral  SpO2: 98%  Weight: 171 lb (77.6 kg)  Height: 5\' 9"  (1.753 m)     Physical Exam Vitals signs reviewed.  Constitutional:      General: She is not in acute distress.    Appearance: Normal appearance. She is well-developed. She is not ill-appearing or diaphoretic.  HENT:     Head: Normocephalic and atraumatic.     Right Ear: Hearing, tympanic membrane, ear canal and external ear normal.     Left Ear: Hearing, tympanic membrane, ear canal and external ear normal.      Nose: Nose normal.     Mouth/Throat:     Mouth: Mucous membranes are moist.     Pharynx: Uvula midline. No oropharyngeal exudate.  Eyes:     General: No scleral icterus.       Right eye: No discharge.        Left eye: No discharge.     Conjunctiva/sclera: Conjunctivae normal.     Pupils: Pupils are equal, round, and reactive to light.  Neck:     Musculoskeletal: Normal range of motion and neck supple.     Thyroid: No thyromegaly.     Trachea: No tracheal deviation.  Cardiovascular:     Rate and Rhythm: Normal rate and regular rhythm.     Heart sounds: Normal heart sounds. No murmur. No friction rub. No gallop.   Pulmonary:     Effort: Pulmonary effort is normal. No respiratory distress.     Breath sounds: Normal breath sounds. No stridor. No wheezing or rales.  Lymphadenopathy:     Cervical: No cervical adenopathy.  Skin:    General: Skin is warm and dry.     Capillary Refill: Capillary refill takes less than 2 seconds.  Neurological:     General: No focal deficit present.     Mental Status: She is alert and oriented to person, place, and time. Mental status is at baseline.  Psychiatric:        Attention and Perception: Attention and perception normal.        Mood and Affect: Mood is anxious.        Behavior: Behavior normal. Behavior is cooperative.        Thought Content: Thought content normal.        Judgment: Judgment normal.         Assessment & Plan    1. Transient alteration of awareness ER notes, labs and images were reviewed from 05/01/19. CT was unremarkable but due to acute episode of confusion will get MRI to r/o any other abnormalities. I do suspect worsened due to anxiety component, but not sure of initial source. I will f/u pending MRI result. - MR Brain W Wo Contrast; Future  2. Suspected UTI At ER had some bacteria noted on the UA with mild leukocytosis. She has also noted some vaginal irritation recently and some vaginal discharge. Will treat with  doxycycline as below. She has appt with Dr. Brita Romp in 2 weeks and may address anxiety and AMS more at that visit if symptoms return.  - doxycycline (VIBRA-TABS) 100 MG tablet; Take 1 tablet (100 mg total) by mouth 2 (two) times daily.  Dispense: 14 tablet; Refill: 0  3. Anxiety Worsening secondary due to worrying about her husband's health and grieving the decline in his health which has limited their social activities. Also having  her own medical issues and slight decline (had 15 in colon resected in March 2020-polyp).  She is currently on sertraline 50mg  at bedtime. Did discuss increasing to 100mg  at bedtime for better control. Also has Alprazolam 0.5mg  to take BID prn.    4. Loose bowel movements Secondary to colon resection in March 2020. Has approx 4-5 loose, watery BM daily. Reports sometimes incontinent. Sometimes mucous associated. Discussed how the colon resection changed her bowel and lessened part of the bowel which helps with water resection. This is why her BM are more loose now. Did discuss importance of hydration as this will also cause dehydration for her. May try Imodium-AD for the next 2 weeks to see if this helps to lessen BM some.   5. Dehydration, mild May have contributed some to her AMS episode. See above medical treatment plan.  6. Cystocele with prolapse Mentions in discussion that she has had worsening bladder prolapse. Does have more pelvic pressure and some urinary leakage. Discussed pelvic floor therapy, pessary and surgical interventions. She wishes to try the antibiotic as noted above for the suspected UTI. She will address in 2 weeks at her f/u with Dr. B if she will desire further evaluation or referral for treatment options.   7. BMI 25.0-25.9,adult Counseled patient on healthy lifestyle modifications including dieting and exercise.    I spent approximately 45 minutes with the patient today. Over 50% of this time was spent with counseling and educating the  patient.    Mar Daring, PA-C  [facility]

## 2019-05-11 ENCOUNTER — Ambulatory Visit (HOSPITAL_COMMUNITY)
Admission: RE | Admit: 2019-05-11 | Discharge: 2019-05-11 | Disposition: A | Discharge status: Home / Self Care | Payer: PPO | Source: Ambulatory Visit | Attending: Physician Assistant | Admitting: Physician Assistant

## 2019-05-11 ENCOUNTER — Other Ambulatory Visit: Payer: Self-pay

## 2019-05-11 DIAGNOSIS — R410 Disorientation, unspecified: Secondary | ICD-10-CM | POA: Diagnosis not present

## 2019-05-11 DIAGNOSIS — R4182 Altered mental status, unspecified: Secondary | ICD-10-CM | POA: Diagnosis not present

## 2019-05-11 DIAGNOSIS — R404 Transient alteration of awareness: Secondary | ICD-10-CM | POA: Diagnosis not present

## 2019-05-11 MED ORDER — GADOBUTROL 1 MMOL/ML IV SOLN
7.0000 mL | Freq: Once | INTRAVENOUS | Status: AC | PRN
  Administered 2019-05-11: 7 mL via INTRAVENOUS

## 2019-05-12 ENCOUNTER — Telehealth: Payer: Self-pay

## 2019-05-12 NOTE — Telephone Encounter (Signed)
Patient advised as directed below. 

## 2019-05-12 NOTE — Telephone Encounter (Signed)
-----   Message from Mar Daring, PA-C sent at 05/12/2019  8:29 AM EDT ----- MRI was unremarkable. No signs of any permanent damage from what was possible TIA. Still may not have been TIA and could have just been dehydration and UTI.

## 2019-05-22 ENCOUNTER — Ambulatory Visit (INDEPENDENT_AMBULATORY_CARE_PROVIDER_SITE_OTHER): Payer: PPO | Admitting: Family Medicine

## 2019-05-22 ENCOUNTER — Encounter: Payer: Self-pay | Admitting: Family Medicine

## 2019-05-22 DIAGNOSIS — I10 Essential (primary) hypertension: Secondary | ICD-10-CM

## 2019-05-22 DIAGNOSIS — E7800 Pure hypercholesterolemia, unspecified: Secondary | ICD-10-CM

## 2019-05-22 DIAGNOSIS — N3946 Mixed incontinence: Secondary | ICD-10-CM

## 2019-05-22 DIAGNOSIS — F411 Generalized anxiety disorder: Secondary | ICD-10-CM | POA: Diagnosis not present

## 2019-05-22 MED ORDER — SERTRALINE HCL 100 MG PO TABS: 100.0000 mg | ORAL_TABLET | Freq: Every day | ORAL | 3 refills | Status: DC

## 2019-05-22 MED ORDER — ATORVASTATIN CALCIUM 10 MG PO TABS: 10.0000 mg | ORAL_TABLET | Freq: Every day | ORAL | 3 refills | Status: DC

## 2019-05-22 NOTE — Progress Notes (Signed)
Patient: Rita Cruz Female    DOB: [birth year]   78 y.o.   MRN: [id number] Visit Date: 05/24/2019  Today's Provider: [name], MD   Chief Complaint  Patient presents with  . Hypertension   Subjective:    Virtual Visit via Video Note  I connected with Rita Cruz on 05/24/19 at  2:40 PM EDT by a video enabled telemedicine application and verified that I am speaking with the correct person using two identifiers.   Patient location: home Provider location: [place] involved in the visit: patient, provider   I discussed the limitations of evaluation and management by telemedicine and the availability of in person appointments. The patient expressed understanding and agreed to proceed.  Interactive audio and video communications were attempted, although failed due to patient's inability to connect to video. Continued visit with audio only interaction with patient agreement.   HPI  Hypertension, follow-up:  BP Readings from Last 3 Encounters:  05/02/19 123/74  05/01/19 (!) 155/80  05/01/19 (!) 177/85    She was last seen for hypertension 3 months ago.  BP at that visit was 114/42. Management changes since that visit include decrease Losartan from 100 mg to 75 mg daily. Continue to hold Doltiazem. She reports good compliance with treatment. She is not having side effects.  She is exercising. She is adherent to low salt diet.   Outside blood pressures are being checked . She is experiencing none.  Patient denies chest pain, chest pressure/discomfort, claudication, dyspnea, exertional chest pressure/discomfort, fatigue, irregular heart beat, lower extremity edema, near-syncope, orthopnea, palpitations, paroxysmal nocturnal dyspnea, syncope and tachypnea.   Cardiovascular risk factors include advanced age (older than 35 for men, 45 for women), dyslipidemia and hypertension.  Use of agents associated with hypertension:  none.     Weight trend: stable Wt Readings from Last 3 Encounters:  05/02/19 171 lb (77.6 kg)  05/01/19 171 lb 15.3 oz (78 kg)  05/01/19 172 lb 9.6 oz (78.3 kg)    Current diet: low salt   Was seen in ED 6/8 for confusion and she was treated for altered mental status.  She underwent labs and CT head and all work-up was unremarkable.  She was given Ativan 1 mg and symptoms improved with reassurance of normal testing.  She was concerned at the time that she was having a stroke.  She felt reassured that she did not have a stroke.    She did have feeling of being off balance temporarily, but now that is resolved.  She wonders if this may be related to her anxiety and stress as well.  Struggling with her husband's health problems.  Feels like she has everything on her plate for everyone.  Continues to have a fullness in vagina that feels that bladder has dropped with mixed incontinence ------------------------------------------------------------------------   Allergies  Allergen Reactions  . Percocet  [Oxycodone-Acetaminophen] Other (See Comments)    Hallucinations Hallucinations  . Levaquin [Levofloxacin In D5w] Diarrhea    Vaginal itching  . Lisinopril     cough  . Meloxicam     Upset stomach  . Penicillins Itching  . Preservision Areds 2 [Multiple Vitamins-Minerals] Nausea Only     Current Outpatient Medications:  .  acetaminophen (TYLENOL) 325 MG tablet, Take 650 mg by mouth every 6 (six) hours as needed for moderate pain., Disp: , Rfl:  .  ALPRAZolam (XANAX) 0.5 MG tablet, TAKE 1 TABLET BY MOUTH EVERY 12 HOURS AS  NEEDED, Disp: 60 tablet, Rfl: 0 .  aspirin (ASPIRIN EC LO-DOSE) 81 MG EC tablet, Take 81 mg by mouth at bedtime. , Disp: , Rfl:  .  fluticasone (FLONASE) 50 MCG/ACT nasal spray, Place 2 sprays into both nostrils daily as needed for allergies., Disp: , Rfl:  .  ketotifen (ZADITOR) 0.025 % ophthalmic solution, Place 1 drop into both eyes daily as needed (allergies).,  Disp: , Rfl:  .  losartan (COZAAR) 50 MG tablet, Take 1.5 tablets (75 mg total) by mouth daily., Disp: 45 tablet, Rfl: 3 .  Multiple Vitamins-Minerals (PRESERVISION AREDS 2 PO), Take 1 tablet by mouth 2 (two) times daily., Disp: , Rfl:  .  omeprazole (PRILOSEC) 20 MG capsule, TAKE 1 CAPSULE BY MOUTH ONCE DAILY (Patient taking differently: Take 20 mg by mouth daily. ), Disp: 90 capsule, Rfl: 3 .  sertraline (ZOLOFT) 100 MG tablet, Take 1 tablet (100 mg total) by mouth daily., Disp: 30 tablet, Rfl: 3 .  atorvastatin (LIPITOR) 10 MG tablet, Take 1 tablet (10 mg total) by mouth daily., Disp: 30 tablet, Rfl: 3  Review of Systems  Constitutional: Negative.   Respiratory: Negative.   Cardiovascular: Negative.   Musculoskeletal: Negative.     Social History   Tobacco Use  . Smoking status: Never Smoker  . Smokeless tobacco: Never Used  . Tobacco comment: does not smoke  Substance Use Topics  . Alcohol use: Yes    Alcohol/week: 0.0 standard drinks    Comment: rarely - wine      Objective:   There were no vitals taken for this visit. There were no vitals filed for this visit.   Physical Exam   No results found for any visits on 05/22/19.     Assessment & Plan      I discussed the assessment and treatment plan with the patient. The patient was provided an opportunity to ask questions and all were answered. The patient agreed with the plan and demonstrated an understanding of the instructions.   The patient was advised to call back or seek an in-person evaluation if the symptoms worsen or if the condition fails to improve as anticipated.  Problem List Items Addressed This Visit      Cardiovascular and Mediastinum   Essential (primary) hypertension - Primary    Reviewed blood pressure from most recent visit on 05/02/2019 that revealed well-controlled blood pressure She has had issues with whitecoat hypertension and anxiety increasing her blood pressure in the past and I suspected  that this contributed to it in the emergency department as well She was previously having some symptomatic orthostatic hypotension and losartan dose was decreased We will continue losartan 75 mg daily and continue to hold diltiazem Discussed warning signs and return precautions      Relevant Medications   atorvastatin (LIPITOR) 10 MG tablet     Other   Anxiety, generalized    Chronic and currently worse in relation to recent stressors with her husband's health problems and feeling as though she is taking care of everyone by herself Encourage self-care Encourage therapy Increase Zoloft 100 mg daily Discussed potential side effects Contracted for safety-no SI      Relevant Medications   sertraline (ZOLOFT) 100 MG tablet   Hypercholesteremia    Patient has never taken a statin that she is been fearful of side effects Discussed the small vessel ischemic changes noted on her CT scan with the patient We agreed together to start statin for prevention of future stroke/heart  attack We will start with low-dose atorvastatin 10 mg daily Advised to take co-Q10 to avoid myalgias Advised that if she does develop myalgias, we could switch to Crestor or different statin to mitigate this Recheck fasting lipid panel and CMP in about 3 months      Relevant Medications   atorvastatin (LIPITOR) 10 MG tablet   Mixed incontinence    Ongoing issue The fullness the patient describes now could be consistent with cystocele or prolapse Unable to examine this today over the phone I do feel that she would benefit from gynecology appointment to evaluate this further and see if she is a candidate for pessary possibly      Relevant Orders   Ambulatory referral to Gynecology       Return in about 3 months (around 08/22/2019) for chronic disease f/u.   The entirety of the information documented in the History of Present Illness, Review of Systems and Physical Exam were personally obtained by me. Portions  of this information were initially documented by Tiburcio Pea, CMA and reviewed by me for thoroughness and accuracy.    Bacigalupo, Dionne Bucy, MD MPH [facility]

## 2019-05-24 NOTE — Assessment & Plan Note (Signed)
Chronic and currently worse in relation to recent stressors with her husband's health problems and feeling as though she is taking care of everyone by herself Encourage self-care Encourage therapy Increase Zoloft 100 mg daily Discussed potential side effects Contracted for safety-no SI

## 2019-05-24 NOTE — Assessment & Plan Note (Signed)
Patient has never taken a statin that she is been fearful of side effects Discussed the small vessel ischemic changes noted on her CT scan with the patient We agreed together to start statin for prevention of future stroke/heart attack We will start with low-dose atorvastatin 10 mg daily Advised to take co-Q10 to avoid myalgias Advised that if she does develop myalgias, we could switch to Crestor or different statin to mitigate this Recheck fasting lipid panel and CMP in about 3 months

## 2019-05-24 NOTE — Assessment & Plan Note (Signed)
Ongoing issue The fullness the patient describes now could be consistent with cystocele or prolapse Unable to examine this today over the phone I do feel that she would benefit from gynecology appointment to evaluate this further and see if she is a candidate for pessary possibly

## 2019-05-24 NOTE — Assessment & Plan Note (Signed)
Reviewed blood pressure from most recent visit on 05/02/2019 that revealed well-controlled blood pressure She has had issues with whitecoat hypertension and anxiety increasing her blood pressure in the past and I suspected that this contributed to it in the emergency department as well She was previously having some symptomatic orthostatic hypotension and losartan dose was decreased We will continue losartan 75 mg daily and continue to hold diltiazem Discussed warning signs and return precautions

## 2019-05-25 ENCOUNTER — Telehealth: Payer: Self-pay | Admitting: Obstetrics & Gynecology

## 2019-05-25 NOTE — Telephone Encounter (Signed)
Patient scheduled 7/15 with [facility]

## 2019-05-25 NOTE — Telephone Encounter (Signed)
[facility] referring Mixed incontinence. Called and left voicemail for patient to call back to be schedule

## 2019-06-05 ENCOUNTER — Other Ambulatory Visit: Payer: Self-pay | Admitting: Family Medicine

## 2019-06-05 DIAGNOSIS — F418 Other specified anxiety disorders: Secondary | ICD-10-CM

## 2019-06-06 ENCOUNTER — Other Ambulatory Visit: Payer: Self-pay | Admitting: Family Medicine

## 2019-06-06 MED ORDER — LOSARTAN POTASSIUM 50 MG PO TABS: 75.0000 mg | ORAL_TABLET | Freq: Every day | ORAL | 3 refills | Status: DC

## 2019-06-06 NOTE — Telephone Encounter (Signed)
[facility] faxed refill request for the following medications:  losartan (COZAAR) 50 MG tablet    Please advise.  

## 2019-06-07 ENCOUNTER — Encounter: Payer: Self-pay | Admitting: Obstetrics and Gynecology

## 2019-06-07 ENCOUNTER — Other Ambulatory Visit: Payer: Self-pay

## 2019-06-07 ENCOUNTER — Ambulatory Visit (INDEPENDENT_AMBULATORY_CARE_PROVIDER_SITE_OTHER): Payer: PPO | Admitting: Obstetrics and Gynecology

## 2019-06-12 ENCOUNTER — Ambulatory Visit (INDEPENDENT_AMBULATORY_CARE_PROVIDER_SITE_OTHER): Payer: PPO | Admitting: Obstetrics and Gynecology

## 2019-06-12 ENCOUNTER — Encounter: Payer: Self-pay | Admitting: Obstetrics and Gynecology

## 2019-06-12 ENCOUNTER — Other Ambulatory Visit: Payer: Self-pay

## 2019-06-12 VITALS — BP 110/68 | HR 84 | Ht 69.0 in | Wt 175.0 lb

## 2019-06-12 DIAGNOSIS — N393 Stress incontinence (female) (male): Secondary | ICD-10-CM

## 2019-06-12 NOTE — Progress Notes (Signed)
Patient ID: Rita Cruz, female   DOB: [birth year], 78 y.o.   MRN: [id number]  Reason for Consult: Referral (Having trouble holding urine, and cough, laugh, sneeze)   Referred by Virginia Crews, MD  Subjective:     HPI:  Rita Cruz is a 78 y.o. female. She reports trouble with leakage of urine with cough, laugh, sneeze.  Gynecological History  No LMP recorded. Patient has had a hysterectomy.   History of fibroids, polyps, or ovarian cysts? : no  History of PCOS? no Hstory of Endometriosis? no History of abnormal pap smears? no Have you had any sexually transmitted infections in the past? no   Past Medical History:  Diagnosis Date  . Adenomatous polyps   . Anxiety   . Arthritis    neck  . Bronchitis 02/04/2018  . Capsulitis of toe of right foot   . Chest pain    Adenosine Myoview in 2006 done for atypical chest pain, showed no eviden e for ischemia or infarct  . Colon polyps   . GERD (gastroesophageal reflux disease)   . History of colonic polyps   . HTN (hypertension)   . Hyperlipidemia   . PUD (peptic ulcer disease)    with hx of H. Pylori   Family History  Problem Relation Age of Onset  . Kidney failure Father   . Heart failure Father   . Diabetes Father   . COPD Mother   . Colon cancer Mother 83  . Heart attack Mother   . Ulcers Mother   . Osteoporosis Mother   . Macular degeneration Mother   . Hypertension Brother   . Irritable bowel syndrome Daughter   . Colon polyps Daughter   . Kidney Stones Son   . Bipolar disorder Son   . Melanoma Son        stage IV  . Hypertension Son   . Kidney cancer Other   . Breast cancer Neg Hx    Past Surgical History:  Procedure Laterality Date  . ABDOMINAL HYSTERECTOMY    . BREAST BIOPSY Right 2011   benign mass done in Dr. [place] office  . BREAST BIOPSY Right 06/29/2015   top hat marker. PROLIFERATIVE FIBROCYSTIC CHANGE WITH ASSOCIATED   . CERVICAL DISCECTOMY    . CHOLECYSTECTOMY     . COLONOSCOPY WITH PROPOFOL N/A 10/06/2016   Procedure: COLONOSCOPY WITH PROPOFOL;  Surgeon: Lollie Sails, MD;  Location: [facility] ENDOSCOPY;  Service: Endoscopy;  Laterality: N/A;  . COLONOSCOPY WITH PROPOFOL N/A 12/30/2018   Procedure: COLONOSCOPY WITH PROPOFOL;  Surgeon: Lollie Sails, MD;  Location: [facility] ENDOSCOPY;  Service: Endoscopy;  Laterality: N/A;  . COLONOSCOPY WITH PROPOFOL N/A 01/30/2020   Procedure: COLONOSCOPY WITH PROPOFOL;  Surgeon: Lucilla Lame, MD;  Location: [facility] ENDOSCOPY;  Service: Endoscopy;  Laterality: N/A;  . EYE SURGERY    . FRACTURE SURGERY Right 05/21/2020   [place]. Proximal humerus fracture  . hysterectomy-unspecified area    . LAPAROSCOPIC RIGHT COLECTOMY Right 01/23/2019   Procedure: LAPAROSCOPIC RIGHT COLECTOMY;  Surgeon: Robert Bellow, MD;  Location: [facility] ORS;  Service: General;  Laterality: Right;    Short Social History:  Social History   Tobacco Use  . Smoking status: Never Smoker  . Smokeless tobacco: Never Used  . Tobacco comment: does not smoke  Substance Use Topics  . Alcohol use: Yes    Alcohol/week: 7.0 standard drinks    Types: 7 Glasses of wine per week  Comment: 1 glass of wine nightly    Allergies  Allergen Reactions  . Morphine And Related Other (See Comments)    Hallucinations   . Percocet  [Oxycodone-Acetaminophen] Other (See Comments)    Hallucinations Hallucinations  . Levaquin [Levofloxacin In D5w] Diarrhea    Vaginal itching  . Lisinopril     cough  . Meloxicam     Upset stomach  . Penicillins Itching  . Preservision Areds 2 [Multiple Vitamins-Minerals] Nausea Only    Current Outpatient Medications  Medication Sig Dispense Refill  . acetaminophen (TYLENOL) 325 MG tablet Take 650 mg by mouth every 6 (six) hours as needed for moderate pain.    Marland Kitchen aspirin 81 MG EC tablet Take 81 mg by mouth at bedtime.     Marland Kitchen ketotifen (ZADITOR) 0.025 % ophthalmic solution Place 1 drop into both eyes daily as  needed (allergies).    . Multiple Vitamins-Minerals (PRESERVISION AREDS 2 PO) Take 1 tablet by mouth 2 (two) times daily.    Marland Kitchen ALPRAZolam (XANAX) 0.5 MG tablet TAKE 1 TABLET BY MOUTH EVERY 12 HOURS AS NEEDED (MUST  LAST  30  DAYS) 60 tablet 0  . ascorbic Acid (VITAMIN C) 500 MG CPCR Take 500 mg by mouth daily.     Marland Kitchen atorvastatin (LIPITOR) 10 MG tablet Take 1 tablet (10 mg total) by mouth daily. 90 tablet 3  . Calcium Carbonate (CALCIUM 600 PO) Take 600 mg by mouth daily at 6 (six) AM.    . co-enzyme Q-10 30 MG capsule Take by mouth daily. Unsure dose    . conjugated estrogens (PREMARIN) vaginal cream Place 1 Applicatorful vaginally 2 (two) times a week. 42.5 g 12  . fluticasone (FLONASE) 50 MCG/ACT nasal spray Place 2 sprays into both nostrils daily as needed for allergies. 48 g 3  . GENTEAL TEARS 0.1-0.3 % SOLN as needed.     Marland Kitchen losartan (COZAAR) 50 MG tablet Take 1.5 tablets (75 mg total) by mouth daily. 135 tablet 3  . omeprazole (PRILOSEC) 20 MG capsule Take 1 capsule (20 mg total) by mouth daily. 90 capsule 3  . oxybutynin (DITROPAN) 5 MG tablet Take 1 tablet (5 mg total) by mouth 2 (two) times daily. 180 tablet 3  . sertraline (ZOLOFT) 100 MG tablet Take 1 tablet (100 mg total) by mouth daily. 90 tablet 3   No current facility-administered medications for this visit.    REVIEW OF SYSTEMS      Objective:  Objective   Vitals:   06/12/19 0804  BP: 110/68  Pulse: 84  Weight: 175 lb (79.4 kg)  Height: 5\' 9"  (1.753 m)   Body mass index is 25.84 kg/m.  Physical Exam  Assessment/Plan:     78 yo Stress incontinence symptoms Will return with a full bladder for cough test, PVR, and urine culture.   Adrian Prows MD [facility] 06/14/2019 12:06 AM

## 2019-06-26 ENCOUNTER — Ambulatory Visit: Payer: PPO | Admitting: Obstetrics and Gynecology

## 2019-07-10 ENCOUNTER — Other Ambulatory Visit: Payer: Self-pay | Admitting: Family Medicine

## 2019-07-10 DIAGNOSIS — F418 Other specified anxiety disorders: Secondary | ICD-10-CM

## 2019-07-17 ENCOUNTER — Ambulatory Visit (INDEPENDENT_AMBULATORY_CARE_PROVIDER_SITE_OTHER): Payer: PPO | Admitting: Obstetrics and Gynecology

## 2019-07-17 ENCOUNTER — Other Ambulatory Visit: Payer: Self-pay

## 2019-07-17 ENCOUNTER — Encounter: Payer: Self-pay | Admitting: Obstetrics and Gynecology

## 2019-07-17 VITALS — BP 130/88 | Ht 69.0 in | Wt 176.0 lb

## 2019-07-17 DIAGNOSIS — N8110 Cystocele, unspecified: Secondary | ICD-10-CM

## 2019-07-17 DIAGNOSIS — N393 Stress incontinence (female) (male): Secondary | ICD-10-CM

## 2019-07-17 NOTE — Progress Notes (Signed)
Patient ID: Rita Cruz, female   DOB: December 08, 1940, 78 y.o.   MRN: [id number]  Reason for Consult: Follow-up (Does not have a full bladder)   Referred by Virginia Crews, MD  Subjective:     HPI:  Rita Cruz is a 78 y.o. female. She is following up today for evaluation of stress urinary incontinence.   Past Medical History:  Diagnosis Date  . Adenomatous polyps   . Anxiety   . Arthritis    neck  . Capsulitis of toe of right foot   . Chest pain    Adenosine Myoview in 2006 done for atypical chest pain, showed no eviden e for ischemia or infarct  . Colon polyps   . GERD (gastroesophageal reflux disease)   . History of colonic polyps   . HTN (hypertension)   . Hyperlipidemia   . PUD (peptic ulcer disease)    with hx of H. Pylori   Family History  Problem Relation Age of Onset  . Kidney failure Father   . Heart failure Father   . Diabetes Father   . COPD Mother   . Colon cancer Mother 8  . Heart attack Mother   . Ulcers Mother   . Osteoporosis Mother   . Macular degeneration Mother   . Hypertension Brother   . Irritable bowel syndrome Daughter   . Colon polyps Daughter   . Kidney Stones Son   . Bipolar disorder Son   . Melanoma Son        stage IV  . Hypertension Son   . Kidney cancer Other   . Breast cancer Neg Hx    Past Surgical History:  Procedure Laterality Date  . ABDOMINAL HYSTERECTOMY    . BREAST BIOPSY Right 2011   benign mass done in Dr. [place] office  . BREAST BIOPSY Right 06/29/2015   top hat marker. PROLIFERATIVE FIBROCYSTIC CHANGE WITH ASSOCIATED   . CERVICAL DISCECTOMY    . CHOLECYSTECTOMY    . COLONOSCOPY WITH PROPOFOL N/A 10/06/2016   Procedure: COLONOSCOPY WITH PROPOFOL;  Surgeon: Lollie Sails, MD;  Location: [facility] ENDOSCOPY;  Service: Endoscopy;  Laterality: N/A;  . COLONOSCOPY WITH PROPOFOL N/A 12/30/2018   Procedure: COLONOSCOPY WITH PROPOFOL;  Surgeon: Lollie Sails, MD;  Location: [facility] ENDOSCOPY;   Service: Endoscopy;  Laterality: N/A;  . EYE SURGERY    . hysterectomy-unspecified area    . LAPAROSCOPIC RIGHT COLECTOMY Right 01/23/2019   Procedure: LAPAROSCOPIC RIGHT COLECTOMY;  Surgeon: Robert Bellow, MD;  Location: [facility] ORS;  Service: General;  Laterality: Right;    Short Social History:  Social History   Tobacco Use  . Smoking status: Never Smoker  . Smokeless tobacco: Never Used  . Tobacco comment: does not smoke  Substance Use Topics  . Alcohol use: Yes    Alcohol/week: 0.0 standard drinks    Comment: rarely - wine    Allergies  Allergen Reactions  . Morphine And Related Other (See Comments)    Hallucinations   . Percocet  [Oxycodone-Acetaminophen] Other (See Comments)    Hallucinations Hallucinations  . Levaquin [Levofloxacin In D5w] Diarrhea    Vaginal itching  . Lisinopril     cough  . Meloxicam     Upset stomach  . Penicillins Itching  . Preservision Areds 2 [Multiple Vitamins-Minerals] Nausea Only    Current Outpatient Medications  Medication Sig Dispense Refill  . ALPRAZolam (XANAX) 0.5 MG tablet TAKE 1 TABLET BY MOUTH EVERY 12 HOURS AS NEEDED 60  tablet 1  . aspirin (ASPIRIN EC LO-DOSE) 81 MG EC tablet Take 81 mg by mouth at bedtime.     Marland Kitchen atorvastatin (LIPITOR) 10 MG tablet Take 1 tablet (10 mg total) by mouth daily. 30 tablet 3  . fluticasone (FLONASE) 50 MCG/ACT nasal spray Place 2 sprays into both nostrils daily as needed for allergies.    Marland Kitchen ketotifen (ZADITOR) 0.025 % ophthalmic solution Place 1 drop into both eyes daily as needed (allergies).    . losartan (COZAAR) 50 MG tablet TAKE 1 & 1/2 (ONE & ONE-HALF) TABLETS BY MOUTH ONCE DAILY 135 tablet 1  . omeprazole (PRILOSEC) 20 MG capsule TAKE 1 CAPSULE BY MOUTH ONCE DAILY (Patient taking differently: Take 20 mg by mouth daily. ) 90 capsule 3  . sertraline (ZOLOFT) 100 MG tablet Take 1 tablet (100 mg total) by mouth daily. 30 tablet 3  . acetaminophen (TYLENOL) 325 MG tablet Take 650 mg by mouth  every 6 (six) hours as needed for moderate pain.    . Multiple Vitamins-Minerals (PRESERVISION AREDS 2 PO) Take 1 tablet by mouth 2 (two) times daily.     No current facility-administered medications for this visit.     REVIEW OF SYSTEMS      Objective:  Objective   Vitals:   07/17/19 1340  BP: 130/88  Weight: 176 lb (79.8 kg)  Height: 5\' 9"  (1.753 m)   Body mass index is 25.99 kg/m.  Physical Exam Vitals signs and nursing note reviewed.  Constitutional:      Appearance: She is well-developed.  HENT:     Head: Normocephalic and atraumatic.  Eyes:     Pupils: Pupils are equal, round, and reactive to light.  Cardiovascular:     Rate and Rhythm: Normal rate and regular rhythm.  Pulmonary:     Effort: Pulmonary effort is normal. No respiratory distress.  Genitourinary:    Comments: External: Normal appearing vulva. No lesions noted.  Speculum examination: Normal appearing vaginal apex. No blood in the vaginal vault. no discharge.   Bimanual examination: Uterus absent. No adnexal masses. No adnexal tenderness. Pelvis not fixed.   Stage I prolapse of anterior vaginal wall  Skin:    General: Skin is warm and dry.  Neurological:     Mental Status: She is alert and oriented to person, place, and time.  Psychiatric:        Behavior: Behavior normal.        Thought Content: Thought content normal.        Judgment: Judgment normal.         Assessment/Plan:     78 yo with complaints of SUI   Stress incontinence demonstrated PVR:10cc Will send for urine culture Stage 1 prolapse of anterior wall No rectal prolapse No pelvic masses  Significant stress with husband. Discussed pessary vs surgical management. Patient would like to trial pessary. She is not sexually active. She will return for an office fitting soon.   More than 25 minutes were spent face to face with the patient in the room with more than 50% of the time spent providing counseling and discussing the  plan of management.    Adrian Prows MD [facility] 07/17/2019 3:10 PM

## 2019-07-19 LAB — URINE CULTURE

## 2019-07-19 NOTE — Progress Notes (Signed)
WNL

## 2019-07-21 ENCOUNTER — Ambulatory Visit (INDEPENDENT_AMBULATORY_CARE_PROVIDER_SITE_OTHER): Payer: PPO | Admitting: Family Medicine

## 2019-07-21 ENCOUNTER — Other Ambulatory Visit: Payer: Self-pay

## 2019-07-21 ENCOUNTER — Encounter: Payer: Self-pay | Admitting: Family Medicine

## 2019-07-21 VITALS — BP 121/72 | HR 88 | Temp 96.9°F | Wt 174.0 lb

## 2019-07-21 DIAGNOSIS — E7800 Pure hypercholesterolemia, unspecified: Secondary | ICD-10-CM

## 2019-07-21 DIAGNOSIS — F411 Generalized anxiety disorder: Secondary | ICD-10-CM | POA: Diagnosis not present

## 2019-07-21 DIAGNOSIS — I10 Essential (primary) hypertension: Secondary | ICD-10-CM

## 2019-07-21 DIAGNOSIS — Z23 Encounter for immunization: Secondary | ICD-10-CM | POA: Diagnosis not present

## 2019-07-21 NOTE — Assessment & Plan Note (Signed)
Tolerating Atorvastatin 10mg  daily well with no side effects - will continue Also continue CoQ10 to prevent myalgias Recheck CMP and FLP at upcoming CPE

## 2019-07-21 NOTE — Progress Notes (Signed)
Patient: Rita Cruz Female    DOB: [name] 11, 1941   78 y.o.   MRN: [id number] Visit Date: 07/21/2019  Today's Provider: [name], MD   Chief Complaint  Patient presents with  . Hypertension  . Hyperlipidemia  . Anxiety   Subjective:    HPI   Hypertension, follow-up:  BP Readings from Last 3 Encounters:  07/21/19 121/72  07/17/19 130/88  06/12/19 110/68    She was last seen for hypertension 2 months ago.  BP at that visit was not checked. Management since that visit includes advised to continue Losartan 75 mg and continue to hold Diltiazem.She reports good compliance with treatment. She is not having side effects.  She is not exercising. She is adherent to low salt diet.   Outside blood pressures are well controlled. She is experiencing none.  Patient denies chest pain, chest pressure/discomfort, claudication, dyspnea, exertional chest pressure/discomfort, fatigue, irregular heart beat, lower extremity edema, near-syncope, orthopnea, palpitations, paroxysmal nocturnal dyspnea, syncope and tachypnea.   Cardiovascular risk factors include advanced age (older than 16 for men, 76 for women), dyslipidemia and hypertension.  Use of agents associated with hypertension: none.   ------------------------------------------------------------------------    Lipid/Cholesterol, Follow-up:   Last seen for this 2 months ago.  Management since that visit includes started Atorvastatin 10 mg  Last Lipid Panel:    Component Value Date/Time   CHOL 182 01/19/2019 0856   CHOL 196 01/18/2017 0953   TRIG 136 01/19/2019 0856   HDL 41 01/19/2019 0856   HDL 40 01/18/2017 0953   CHOLHDL 4.4 01/19/2019 0856   VLDL 27 01/19/2019 0856   LDLCALC 114 (H) 01/19/2019 0856   LDLCALC 123 (H) 01/18/2017 0953    She reports good compliance with treatment. She is not having side effects.   Wt Readings from Last 3 Encounters:  07/21/19 174 lb (78.9 kg)  07/17/19 176 lb (79.8  kg)  06/12/19 175 lb (79.4 kg)    ------------------------------------------------------------------------  Anxiety Patient presents today for anxiety follow-up. Patient was last seen via virtual visit on 05/22/2019. Patient Zoloft was increased to 100 MG daily.  Depression screen [facility] 2/9 07/21/2019 05/01/2019 12/20/2018 12/20/2018 12/07/2017  Decreased Interest 0 0 0 0 0  Down, Depressed, Hopeless 1 1 1 1  0  PHQ - 2 Score 1 1 1 1  0  Altered sleeping 0 - 0 - -  Tired, decreased energy 1 - 1 - -  Change in appetite 1 - 0 - -  Feeling bad or failure about yourself  0 - 0 - -  Trouble concentrating 0 - 0 - -  Moving slowly or fidgety/restless 1 - 0 - -  Suicidal thoughts 0 - 0 - -  PHQ-9 Score 4 - 2 - -  Difficult doing work/chores Somewhat difficult - Not difficult at all - -    GAD 7 : Generalized Anxiety Score 07/21/2019  Nervous, Anxious, on Edge 1  Control/stop worrying 1  Worry too much - different things 1  Trouble relaxing 0  Restless 0  Easily annoyed or irritable 1  Afraid - awful might happen 1  Total GAD 7 Score 5  Anxiety Difficulty Somewhat difficult       Allergies  Allergen Reactions  . Morphine And Related Other (See Comments)    Hallucinations   . Percocet  [Oxycodone-Acetaminophen] Other (See Comments)    Hallucinations Hallucinations  . Levaquin [Levofloxacin In D5w] Diarrhea    Vaginal itching  . Lisinopril  cough  . Meloxicam     Upset stomach  . Penicillins Itching  . Preservision Areds 2 [Multiple Vitamins-Minerals] Nausea Only     Current Outpatient Medications:  .  acetaminophen (TYLENOL) 325 MG tablet, Take 650 mg by mouth every 6 (six) hours as needed for moderate pain., Disp: , Rfl:  .  ALPRAZolam (XANAX) 0.5 MG tablet, TAKE 1 TABLET BY MOUTH EVERY 12 HOURS AS NEEDED, Disp: 60 tablet, Rfl: 1 .  aspirin (ASPIRIN EC LO-DOSE) 81 MG EC tablet, Take 81 mg by mouth at bedtime. , Disp: , Rfl:  .  atorvastatin (LIPITOR) 10 MG tablet, Take 1  tablet (10 mg total) by mouth daily., Disp: 30 tablet, Rfl: 3 .  fluticasone (FLONASE) 50 MCG/ACT nasal spray, Place 2 sprays into both nostrils daily as needed for allergies., Disp: , Rfl:  .  ketotifen (ZADITOR) 0.025 % ophthalmic solution, Place 1 drop into both eyes daily as needed (allergies)., Disp: , Rfl:  .  losartan (COZAAR) 50 MG tablet, TAKE 1 & 1/2 (ONE & ONE-HALF) TABLETS BY MOUTH ONCE DAILY, Disp: 135 tablet, Rfl: 1 .  Multiple Vitamins-Minerals (PRESERVISION AREDS 2 PO), Take 1 tablet by mouth 2 (two) times daily., Disp: , Rfl:  .  omeprazole (PRILOSEC) 20 MG capsule, TAKE 1 CAPSULE BY MOUTH ONCE DAILY (Patient taking differently: Take 20 mg by mouth daily. ), Disp: 90 capsule, Rfl: 3 .  sertraline (ZOLOFT) 100 MG tablet, Take 1 tablet (100 mg total) by mouth daily., Disp: 30 tablet, Rfl: 3  Review of Systems  Constitutional: Negative.   HENT: Negative.   Respiratory: Negative.   Cardiovascular: Negative.   Genitourinary: Negative.   Neurological: Negative.   Psychiatric/Behavioral: Negative.     Social History   Tobacco Use  . Smoking status: Never Smoker  . Smokeless tobacco: Never Used  . Tobacco comment: does not smoke  Substance Use Topics  . Alcohol use: Yes    Alcohol/week: 0.0 standard drinks    Comment: rarely - wine      Objective:   BP 121/72 (BP Location: Right Arm, Patient Position: Sitting, Cuff Size: Normal)   Pulse 88   Temp (!) 96.9 F (36.1 C) (Temporal)   Wt 174 lb (78.9 kg)   SpO2 98%   BMI 25.70 kg/m  Vitals:   07/21/19 1429  BP: 121/72  Pulse: 88  Temp: (!) 96.9 F (36.1 C)  TempSrc: Temporal  SpO2: 98%  Weight: 174 lb (78.9 kg)     Physical Exam Vitals signs reviewed.  Constitutional:      General: She is not in acute distress.    Appearance: Normal appearance. She is well-developed. She is not diaphoretic.  HENT:     Head: Normocephalic and atraumatic.  Eyes:     General: No scleral icterus.    Conjunctiva/sclera:  Conjunctivae normal.  Neck:     Musculoskeletal: Neck supple.     Thyroid: No thyromegaly.  Cardiovascular:     Rate and Rhythm: Normal rate and regular rhythm.     Pulses: Normal pulses.     Heart sounds: Normal heart sounds. No murmur.  Pulmonary:     Effort: Pulmonary effort is normal. No respiratory distress.     Breath sounds: Normal breath sounds. No wheezing, rhonchi or rales.  Abdominal:     General: There is no distension.     Palpations: Abdomen is soft.     Tenderness: There is no abdominal tenderness.  Musculoskeletal:     Right  lower leg: No edema.     Left lower leg: No edema.  Lymphadenopathy:     Cervical: No cervical adenopathy.  Skin:    General: Skin is warm and dry.     Capillary Refill: Capillary refill takes less than 2 seconds.     Findings: No rash.  Neurological:     Mental Status: She is alert and oriented to person, place, and time. Mental status is at baseline.  Psychiatric:        Mood and Affect: Mood normal.        Behavior: Behavior normal.      No results found for any visits on 07/21/19.     Assessment & Plan   Problem List Items Addressed This Visit      Cardiovascular and Mediastinum   Essential (primary) hypertension    Well controlled Continue current medications Recheck metabolic panel F/u in 6 months         Other   Anxiety, generalized    Chronic and well controlled Improved since increasing Zoloft, despite ongoing stressors Continue Zoloft 100mg  daily Taking Xanax sparingly - will not plan to increase      Hypercholesteremia    Tolerating Atorvastatin 10mg  daily well with no side effects - will continue Also continue CoQ10 to prevent myalgias Recheck CMP and FLP at upcoming CPE       Other Visit Diagnoses    Need for influenza vaccination    -  Primary   Relevant Orders   Flu Vaccine QUAD High Dose(Fluad) (Completed)       Return in about 6 months (around 01/21/2020) for CPE/AWV.   The entirety of the  information documented in the History of Present Illness, Review of Systems and Physical Exam were personally obtained by me. Portions of this information were initially documented by [facility], CMA and reviewed by me for thoroughness and accuracy.    Bacigalupo, Dionne Bucy, MD MPH [facility]

## 2019-07-21 NOTE — Assessment & Plan Note (Signed)
Chronic and well controlled Improved since increasing Zoloft, despite ongoing stressors Continue Zoloft 100mg  daily Taking Xanax sparingly - will not plan to increase

## 2019-07-21 NOTE — Assessment & Plan Note (Signed)
Well controlled Continue current medications Recheck metabolic panel F/u in 6 months  

## 2019-07-25 ENCOUNTER — Encounter: Payer: Self-pay | Admitting: Obstetrics and Gynecology

## 2019-07-25 DIAGNOSIS — N8110 Cystocele, unspecified: Secondary | ICD-10-CM | POA: Insufficient documentation

## 2019-07-25 DIAGNOSIS — N393 Stress incontinence (female) (male): Secondary | ICD-10-CM | POA: Insufficient documentation

## 2019-08-03 ENCOUNTER — Ambulatory Visit (INDEPENDENT_AMBULATORY_CARE_PROVIDER_SITE_OTHER): Payer: PPO | Admitting: Obstetrics and Gynecology

## 2019-08-03 ENCOUNTER — Encounter: Payer: Self-pay | Admitting: Obstetrics and Gynecology

## 2019-08-03 ENCOUNTER — Other Ambulatory Visit: Payer: Self-pay

## 2019-08-03 VITALS — BP 140/80 | HR 68 | Ht 69.0 in | Wt 175.0 lb

## 2019-08-03 DIAGNOSIS — N393 Stress incontinence (female) (male): Secondary | ICD-10-CM | POA: Diagnosis not present

## 2019-08-03 DIAGNOSIS — N8110 Cystocele, unspecified: Secondary | ICD-10-CM | POA: Diagnosis not present

## 2019-08-03 NOTE — Progress Notes (Signed)
Pessary Fitting Patient presents for a pessary fitting. She desires a pessary as her means of controlling her symptoms of prolapse and/or urinary incontinence. She understands the care needed for a pessary and desires to proceed. Alternative treatment options have been discussed at length and the patient voices an understanding of each option.   PROCEDURE: The patient was placed in dorsal lithotomy position. Examination confirmed prolapse. A 2 ring pessary was fitted without difficulty. The patient subsequently ambulated, voided and performed valsalva maneuvers without dislodging the pessary and without discomfort. Care instructions were provided. Patient was discharged to home in stable condition.   She will follow up once her pessary arrives for placement.   More than 20 minutes were spent face to face with the patient in the room with more than 50% of the time spent providing counseling and discussing the plan of management.   Adrian Prows MD [facility] 08/04/2019 1:19 PM

## 2019-08-04 ENCOUNTER — Encounter: Payer: Self-pay | Admitting: Obstetrics and Gynecology

## 2019-08-10 ENCOUNTER — Telehealth: Payer: Self-pay | Admitting: Obstetrics and Gynecology

## 2019-08-10 ENCOUNTER — Ambulatory Visit: Payer: PPO | Admitting: Obstetrics and Gynecology

## 2019-08-10 NOTE — Telephone Encounter (Signed)
Called and left voice mail for patient to call back to be schedule °

## 2019-08-10 NOTE — Telephone Encounter (Signed)
Patient is schedule 08/30/19

## 2019-08-10 NOTE — Telephone Encounter (Signed)
-----   Message from [place], LPN sent at [phone]  9:52 AM EDT ----- Regarding: Pessary Pessary has arrived. Please schedule apt for insertion. ----- Message ----- From: Homero Fellers, MD Sent: 08/03/2019   4:15 PM EDT To: Otila Kluver, LPN  Please order this patient a size 2 ring pessary with an incontinence knob. Please schedule her for a visit when it come in.  Thank you! Dr. Gilman Schmidt

## 2019-08-28 ENCOUNTER — Other Ambulatory Visit: Payer: Self-pay | Admitting: Family Medicine

## 2019-08-28 MED ORDER — ATORVASTATIN CALCIUM 10 MG PO TABS: 10.0000 mg | ORAL_TABLET | Freq: Every day | ORAL | 3 refills | Status: DC

## 2019-08-28 NOTE — Telephone Encounter (Signed)
OK to refill #[id number]

## 2019-08-28 NOTE — Telephone Encounter (Signed)
[facility] faxed refill request for the following medications:   atorvastatin (LIPITOR) 10 MG tablet    Please advise.   

## 2019-08-30 ENCOUNTER — Other Ambulatory Visit: Payer: Self-pay

## 2019-08-30 ENCOUNTER — Encounter: Payer: Self-pay | Admitting: Obstetrics and Gynecology

## 2019-08-30 ENCOUNTER — Ambulatory Visit (INDEPENDENT_AMBULATORY_CARE_PROVIDER_SITE_OTHER): Payer: PPO | Admitting: Obstetrics and Gynecology

## 2019-08-30 VITALS — BP 150/90 | HR 60 | Ht 69.0 in | Wt 180.0 lb

## 2019-08-30 DIAGNOSIS — N393 Stress incontinence (female) (male): Secondary | ICD-10-CM

## 2019-08-30 DIAGNOSIS — N8110 Cystocele, unspecified: Secondary | ICD-10-CM

## 2019-08-30 NOTE — Progress Notes (Signed)
Pessary Fitting Patient presents for a pessary fitting. She desires a pessary as her means of controlling her symptoms of prolapse and/or urinary incontinence. She understands the care needed for a pessary and desires to proceed. Alternative treatment options have been discussed at length and the patient voices an understanding of each option.   PROCEDURE: The patient was placed in dorsal lithotomy position. Examination confirmed prolapse. A 2 ring with knob pessary was fitted without difficulty. The patient subsequently ambulated, voided and performed valsalva maneuvers without dislodging the pessary and without discomfort. Care instructions were provided. Patient was discharged to home in stable condition.    Adrian Prows MD [facility] 08/30/2019 2:10 PM

## 2019-09-13 ENCOUNTER — Telehealth: Payer: Self-pay | Admitting: Family Medicine

## 2019-09-13 NOTE — Chronic Care Management (AMB) (Signed)
°  Chronic Care Management   Outreach Note  09/13/2019 Name: Rita Cruz MRN: [id number] DOB: September 06, 1941  Referred by: Virginia Crews, MD Reason for referral : Chronic Care Management (Initial CCM outreach was unsuccessful.)   An unsuccessful telephone outreach was attempted today. The patient was referred to the case management team by for assistance with care management and care coordination.   Follow Up Plan: A HIPPA compliant phone message was left for the patient providing contact information and requesting a return call.  The care management team will reach out to the patient again over the next 7 days.  If patient returns call to provider office, please advise to call [place] at [place]  [email or web address]   ??[phone]

## 2019-10-02 ENCOUNTER — Other Ambulatory Visit: Payer: Self-pay | Admitting: Family Medicine

## 2019-10-02 DIAGNOSIS — F418 Other specified anxiety disorders: Secondary | ICD-10-CM

## 2019-10-02 MED ORDER — ALPRAZOLAM 0.5 MG PO TABS: 0.5000 mg | ORAL_TABLET | Freq: Two times a day (BID) | ORAL | 1 refills | Status: DC | PRN

## 2019-10-02 NOTE — Telephone Encounter (Signed)
[place] Is requesting refill authorization for Xanax 0.5 mg.

## 2019-10-10 NOTE — Chronic Care Management (AMB) (Signed)
Chronic Care Management   Note  10/10/2019 Name: Rita Cruz MRN: [id number] DOB: [birth year]  Rita Cruz is a 78 y.o. year old female who is a primary care patient of Brita Romp, Dionne Bucy, MD. I reached out to [place] by phone today in response to a referral sent by Rita Cruz's health plan.     Rita Cruz was given information about Chronic Care Management services today including:  1. CCM service includes personalized support from designated clinical staff supervised by her physician, including individualized plan of care and coordination with other care providers 2. 24/7 contact phone numbers for assistance for urgent and routine care needs. 3. Service will only be billed when office clinical staff spend 20 minutes or more in a month to coordinate care. 4. Only one practitioner may furnish and bill the service in a calendar month. 5. The patient may stop CCM services at any time (effective at the end of the month) by phone call to the office staff. 6. The patient will be responsible for cost sharing (co-pay) of up to 20% of the service fee (after annual deductible is met).  Patient agreed to services and verbal consent obtained.   Follow up plan: Telephone appointment with CCM team member scheduled for: 10/13/2019  [place], [place], [place] [id number] Direct Dial: [email or web address]  Website: [email or web address]

## 2019-10-13 ENCOUNTER — Ambulatory Visit: Payer: PPO | Admitting: *Deleted

## 2019-10-13 ENCOUNTER — Encounter: Payer: Self-pay | Admitting: *Deleted

## 2019-10-13 DIAGNOSIS — F411 Generalized anxiety disorder: Secondary | ICD-10-CM

## 2019-10-13 DIAGNOSIS — F329 Major depressive disorder, single episode, unspecified: Secondary | ICD-10-CM

## 2019-10-15 NOTE — Chronic Care Management (AMB) (Signed)
Chronic Care Management    Clinical Social Work Follow Up Note  10/15/2019 Name: Rita Cruz MRN: [id number] DOB: 1941-06-09  Rita Cruz is a 78 y.o. year old female who is a primary care patient of Bacigalupo, Dionne Bucy, MD. The CCM team was consulted for assistance with Mental Health Counseling and Resources.   Review of patient status, including review of consultants reports, other relevant assessments, and collaboration with appropriate care team members and the patient's provider was performed as part of comprehensive patient evaluation and provision of chronic care management services.    SDOH (Social Determinants of Health) screening performed today: Depression   Stress Physical Activity. See Care Plan for related entries.   Advanced Directives Status: <no information> See Care Plan for related entries.   Outpatient Encounter Medications as of 10/13/2019  Medication Sig  . acetaminophen (TYLENOL) 325 MG tablet Take 650 mg by mouth every 6 (six) hours as needed for moderate pain.  Marland Kitchen ALPRAZolam (XANAX) 0.5 MG tablet Take 1 tablet (0.5 mg total) by mouth every 12 (twelve) hours as needed.  Marland Kitchen aspirin (ASPIRIN EC LO-DOSE) 81 MG EC tablet Take 81 mg by mouth at bedtime.   Marland Kitchen atorvastatin (LIPITOR) 10 MG tablet Take 1 tablet (10 mg total) by mouth daily.  . fluticasone (FLONASE) 50 MCG/ACT nasal spray Place 2 sprays into both nostrils daily as needed for allergies.  Marland Kitchen ketotifen (ZADITOR) 0.025 % ophthalmic solution Place 1 drop into both eyes daily as needed (allergies).  . losartan (COZAAR) 50 MG tablet TAKE 1 & 1/2 (ONE & ONE-HALF) TABLETS BY MOUTH ONCE DAILY  . Multiple Vitamins-Minerals (PRESERVISION AREDS 2 PO) Take 1 tablet by mouth 2 (two) times daily.  Marland Kitchen omeprazole (PRILOSEC) 20 MG capsule TAKE 1 CAPSULE BY MOUTH ONCE DAILY (Patient taking differently: Take 20 mg by mouth daily. )  . sertraline (ZOLOFT) 100 MG tablet Take 1 tablet (100 mg total) by mouth daily.   No facility-administered encounter medications on file as of 10/13/2019.      Goals Addressed            This Visit's Progress   . "I need help managing my stress" (pt-stated)       Current Barriers:  . Acute Mental Health needs related to adjustment to spouse's medical condition and family conflicts  . Mental Health Concerns  . Suicidal Ideation/Homicidal Ideation: No  Clinical Social Work Goal(s):  Marland Kitchen Over the next 90 days, patient will work with SW bi-weekly by telephone or in person to reduce or manage symptoms related to stress management   Interventions: . Patient interviewed and appropriate assessments performed: brief mental health assessment, PHQ 2, and PHQ 9 . Patient's stress level discussed as well as possible triggers . Discussed current coping strategies used, exploring alternative perspectives . Developing a self care plan emphasized . Discussed plans with patient for ongoing care management follow up and provided patient with direct contact information for care management team . Emotional/Supportive Counseling provided related to family stress experienced allowing her to vent her frustrations  Patient Self Care Activities:  . Performs ADL's independently . Performs IADL's independently . Ability for insight  Patient Coping Strengths:  . Supportive Relationships . Friends . Church . Hopefulness . Able to Communicate Effectively  Patient Self Care Deficits:  . Difficulty verbalizing plan for self care  Initial goal documentation         Follow Up Plan: SW will follow up with patient by phone over the next  2 weeks    [place], [facility] Care Management [phone]

## 2019-10-16 NOTE — Patient Instructions (Addendum)
Thank you allowing the Chronic Care Management Team to be a part of your care! It was a pleasure speaking with you today!  1. Please call this social worker with any questions or concerns regarding your mental health or community resource needs.  CCM (Chronic Care Management) Team   Neldon Labella RN, BSN Nurse Care Coordinator  [phone]  Ruben Reason PharmD  Clinical Pharmacist  [phone]   Elliot Gurney, LCSW Clinical Social Worker [phone]  Goals Addressed            This Visit's Progress   . "I need help managing my stress" (pt-stated)       Current Barriers:  . Acute Mental Health needs related to adjustment to spouse's medical condition and family conflicts  . Mental Health Concerns  . Suicidal Ideation/Homicidal Ideation: No  Clinical Social Work Goal(s):  Marland Kitchen Over the next 90 days, patient will work with SW bi-weekly by telephone or in person to reduce or manage symptoms related to stress management   Interventions: . Patient interviewed and appropriate assessments performed: brief mental health assessment, PHQ 2, and PHQ 9 . Patient's stress level discussed as well as possible triggers . Discussed current coping strategies used, exploring alternative perspectives . Developing a self care plan emphasized . Discussed plans with patient for ongoing care management follow up and provided patient with direct contact information for care management team . Emotional/Supportive Counseling provided related to family stress experienced allowing her to vent her frustrations  Patient Self Care Activities:  . Performs ADL's independently . Performs IADL's independently . Ability for insight  Patient Coping Strengths:  . Supportive Relationships . Friends . Church . Hopefulness . Able to Communicate Effectively  Patient Self Care Deficits:  . Difficulty verbalizing plan for self care  Initial goal documentation         The patient verbalized  understanding of instructions provided today and declined a print copy of patient instruction materials.   Telephone follow up appointment with care management team member scheduled for:10/27/19

## 2019-10-27 ENCOUNTER — Ambulatory Visit (INDEPENDENT_AMBULATORY_CARE_PROVIDER_SITE_OTHER): Payer: PPO | Admitting: *Deleted

## 2019-10-27 DIAGNOSIS — F329 Major depressive disorder, single episode, unspecified: Secondary | ICD-10-CM

## 2019-10-27 DIAGNOSIS — F411 Generalized anxiety disorder: Secondary | ICD-10-CM

## 2019-10-27 NOTE — Chronic Care Management (AMB) (Signed)
Chronic Care Management    Clinical Social Work Follow Up Note  10/27/2019 Name: Rita Cruz MRN: [id number] DOB: January 22, 1941  Rita Cruz is a 78 y.o. year old female who is a primary care patient of Bacigalupo, Dionne Bucy, MD. The CCM team was consulted for assistance with Mental Health Counseling and Resources.   Review of patient status, including review of consultants reports, other relevant assessments, and collaboration with appropriate care team members and the patient's provider was performed as part of comprehensive patient evaluation and provision of chronic care management services.     Advanced Directives Status: <no information> See Care Plan for related entries.   Outpatient Encounter Medications as of 10/27/2019  Medication Sig  . acetaminophen (TYLENOL) 325 MG tablet Take 650 mg by mouth every 6 (six) hours as needed for moderate pain.  Marland Kitchen ALPRAZolam (XANAX) 0.5 MG tablet Take 1 tablet (0.5 mg total) by mouth every 12 (twelve) hours as needed.  Marland Kitchen aspirin (ASPIRIN EC LO-DOSE) 81 MG EC tablet Take 81 mg by mouth at bedtime.   Marland Kitchen atorvastatin (LIPITOR) 10 MG tablet Take 1 tablet (10 mg total) by mouth daily.  . fluticasone (FLONASE) 50 MCG/ACT nasal spray Place 2 sprays into both nostrils daily as needed for allergies.  Marland Kitchen ketotifen (ZADITOR) 0.025 % ophthalmic solution Place 1 drop into both eyes daily as needed (allergies).  . losartan (COZAAR) 50 MG tablet TAKE 1 & 1/2 (ONE & ONE-HALF) TABLETS BY MOUTH ONCE DAILY  . Multiple Vitamins-Minerals (PRESERVISION AREDS 2 PO) Take 1 tablet by mouth 2 (two) times daily.  Marland Kitchen omeprazole (PRILOSEC) 20 MG capsule TAKE 1 CAPSULE BY MOUTH ONCE DAILY (Patient taking differently: Take 20 mg by mouth daily. )  . sertraline (ZOLOFT) 100 MG tablet Take 1 tablet (100 mg total) by mouth daily.   No facility-administered encounter medications on file as of 10/27/2019.      Goals Addressed            This Visit's Progress   .  "I need help managing my stress" (pt-stated)       Current Barriers:  . Acute Mental Health needs related to adjustment to spouse's medical condition and family conflicts  . Mental Health Concerns  . Suicidal Ideation/Homicidal Ideation: No  Clinical Social Work Goal(s):  Marland Kitchen Over the next 90 days, patient will work with SW bi-weekly by telephone or in person to reduce or manage symptoms related to stress management   Interventions: . Status of patient's stress level discussed as well as possible triggers . Confirmed that patient was doing much better due to son's surgery being a success and the delivery of her great grand daughter . Discussed current coping strategies used, (crafting, prayer, reaching out to friends)  exploring alternative perspectives . Positive Reinforcement provided in regards to the development of a self care plan . Discussed plans with patient for ongoing care management follow up and provided patient with direct contact information for care management team . Emotional/Supportive Counseling provided related to family stress experienced allowing her to vent her frustrations  Patient Self Care Activities:  . Performs ADL's independently . Performs IADL's independently . Ability for insight  Patient Coping Strengths:  . Supportive Relationships . Friends . Church . Hopefulness . Able to Communicate Effectively  Patient Self Care Deficits:  . Difficulty verbalizing plan for self care  Please see past updates related to this goal by clicking on the "Past Updates" button in the selected goal  Follow Up Plan: SW will follow up with patient by phone over the next 30 business days to follow up on progress made    [employer], [place] Worker  [facility] Care Management [phone]

## 2019-10-27 NOTE — Patient Instructions (Signed)
Thank you allowing the Chronic Care Management Team to be a part of your care! It was a pleasure speaking with you today!  1. Please call this social worker with any questions or concerns regarding your mental health needs.  CCM (Chronic Care Management) Team   Neldon Labella  RN, BSN Nurse Care Coordinator  [phone]  Ruben Reason PharmD  Clinical Pharmacist  [phone]   Elliot Gurney, LCSW Clinical Social Worker [phone]  Goals Addressed            This Visit's Progress   . "I need help managing my stress" (pt-stated)       Current Barriers:  . Acute Mental Health needs related to adjustment to spouse's medical condition and family conflicts  . Mental Health Concerns  . Suicidal Ideation/Homicidal Ideation: No  Clinical Social Work Goal(s):  Marland Kitchen Over the next 90 days, patient will work with SW bi-weekly by telephone or in person to reduce or manage symptoms related to stress management   Interventions: . Status of patient's stress level discussed as well as possible triggers . Confirmed that patient was doing much better due to son's surgery being a success and the delivery of her great grand daughter . Discussed current coping strategies used, (crafting, prayer, reaching out to friends)  exploring alternative perspectives . Positive Reinforcement provided in regards to the development of a self care plan . Discussed plans with patient for ongoing care management follow up and provided patient with direct contact information for care management team . Emotional/Supportive Counseling provided related to family stress experienced allowing her to vent her frustrations  Patient Self Care Activities:  . Performs ADL's independently . Performs IADL's independently . Ability for insight  Patient Coping Strengths:  . Supportive Relationships . Friends . Church . Hopefulness . Able to Communicate Effectively  Patient Self Care Deficits:  . Difficulty  verbalizing plan for self care  Please see past updates related to this goal by clicking on the "Past Updates" button in the selected goal          The patient verbalized understanding of instructions provided today and declined a print copy of patient instruction materials.   The care management team will reach out to the patient again over the next 30  days.

## 2019-11-03 ENCOUNTER — Other Ambulatory Visit: Payer: Self-pay | Admitting: Family Medicine

## 2019-11-04 ENCOUNTER — Other Ambulatory Visit: Payer: Self-pay | Admitting: Family Medicine

## 2019-11-05 NOTE — Telephone Encounter (Signed)
Requested Prescriptions  Pending Prescriptions Disp Refills  . omeprazole (PRILOSEC) 20 MG capsule [Pharmacy Med Name: Omeprazole 20 MG Oral Capsule Delayed Release] 90 capsule 1    Sig: Take 1 capsule by mouth once daily     Gastroenterology: Proton Pump Inhibitors Passed - 11/04/2019  5:30 AM      Passed - Valid encounter within last 12 months    Recent Outpatient Visits          3 months ago Need for influenza vaccination   [facility] [place], Dionne Bucy, MD   5 months ago Essential (primary) hypertension   [facility] [place], Dionne Bucy, MD   6 months ago Transient alteration of awareness   [facility] [place] Malling M, [place]   6 months ago Confusion   [place], [place], PA-C   8 months ago Essential (primary) hypertension   [employer] [facility], Dionne Bucy, MD      Future Appointments            In 1 month [facility], [place]

## 2019-11-22 DIAGNOSIS — H353131 Nonexudative age-related macular degeneration, bilateral, early dry stage: Secondary | ICD-10-CM | POA: Diagnosis not present

## 2019-11-23 ENCOUNTER — Telehealth: Payer: Self-pay

## 2019-11-23 NOTE — Telephone Encounter (Signed)
Copied from [place] [phone]. Topic: Appointment Scheduling - Scheduling Inquiry for Clinic >> Nov 23, 2019 10:02 AM Greggory Keen D wrote: Reason for CRM: pt called asking if she can reschedule her medicare wellness and do a phone visit instead.  CB#  [phone]

## 2019-11-27 ENCOUNTER — Other Ambulatory Visit: Payer: Self-pay | Admitting: Family Medicine

## 2019-11-27 ENCOUNTER — Ambulatory Visit (INDEPENDENT_AMBULATORY_CARE_PROVIDER_SITE_OTHER): Payer: PPO | Admitting: *Deleted

## 2019-11-27 DIAGNOSIS — F329 Major depressive disorder, single episode, unspecified: Secondary | ICD-10-CM

## 2019-11-27 DIAGNOSIS — F411 Generalized anxiety disorder: Secondary | ICD-10-CM

## 2019-11-27 DIAGNOSIS — F418 Other specified anxiety disorders: Secondary | ICD-10-CM

## 2019-11-27 NOTE — Chronic Care Management (AMB) (Addendum)
Chronic Care Management    Clinical Social Work Follow Up Note  11/27/2019 Name: Rita Cruz MRN: [id number] DOB: 01-22-1941  Rita Cruz is a 79 y.o. year old female who is a primary care patient of Bacigalupo, Dionne Bucy, MD. The CCM team was consulted for assistance with Mental Health Counseling and Resources.   Review of patient status, including review of consultants reports, other relevant assessments, and collaboration with appropriate care team members and the patient's provider was performed as part of comprehensive patient evaluation and provision of chronic care management services.    Advanced Directives Status: <no information> See Care Plan for related entries.   Outpatient Encounter Medications as of 11/27/2019  Medication Sig  . acetaminophen (TYLENOL) 325 MG tablet Take 650 mg by mouth every 6 (six) hours as needed for moderate pain.  Marland Kitchen ALPRAZolam (XANAX) 0.5 MG tablet Take 1 tablet (0.5 mg total) by mouth every 12 (twelve) hours as needed.  Marland Kitchen aspirin (ASPIRIN EC LO-DOSE) 81 MG EC tablet Take 81 mg by mouth at bedtime.   Marland Kitchen atorvastatin (LIPITOR) 10 MG tablet Take 1 tablet (10 mg total) by mouth daily.  . fluticasone (FLONASE) 50 MCG/ACT nasal spray Place 2 sprays into both nostrils daily as needed for allergies.  Marland Kitchen ketotifen (ZADITOR) 0.025 % ophthalmic solution Place 1 drop into both eyes daily as needed (allergies).  . losartan (COZAAR) 50 MG tablet TAKE 1 & 1/2 (ONE & ONE-HALF) TABLETS BY MOUTH ONCE DAILY  . Multiple Vitamins-Minerals (PRESERVISION AREDS 2 PO) Take 1 tablet by mouth 2 (two) times daily.  Marland Kitchen omeprazole (PRILOSEC) 20 MG capsule Take 1 capsule by mouth once daily  . sertraline (ZOLOFT) 100 MG tablet Take 1 tablet by mouth once daily   No facility-administered encounter medications on file as of 11/27/2019.     Goals Addressed            This Visit's Progress   . "I need help managing my stress" (pt-stated)       Current Barriers:  .  Acute Mental Health needs related to adjustment to spouse's medical condition and family conflicts  . Mental Health Concerns  . Suicidal Ideation/Homicidal Ideation: No  Clinical Social Work Goal(s):  Marland Kitchen Over the next 90 days, patient will work with SW bi-weekly by telephone or in person to reduce or manage symptoms related to stress management   Interventions: . Status of patient's stress level discussed as well as possible triggers . Confirmed that patient was doing much better due to son and daughter's surgery being a success as well as the delivery of her great grand daughter  . Discussed current coping strategies used, (crafting, prayer, reaching out to friends)  exploring alternative perspectives . Discussed plan to go the beach for the next month with spouse (11/30/19-12/16/19) . Positive Reinforcement provided in regards to the development of a self care plan . Discussed plans with patient for ongoing care management follow up and provided patient with direct contact information for care management team if needed in the future . Emotional/Supportive Counseling provided related to family stress experienced allowing her to vent her frustrations and celebrate her sucessess  Patient Self Care Activities:  . Performs ADL's independently . Performs IADL's independently . Ability for insight  Patient Coping Strengths:  . Supportive Relationships . Friends . Church . Hopefulness . Able to Communicate Effectively  Patient Self Care Deficits:  . Difficulty verbalizing plan for self care  Please see past updates related to this goal  by clicking on the "Past Updates" button in the selected goal          Follow Up Plan: Client will contact this social worker if mental health needs in the future    Dekota Shenk, [place] Worker  [facility] Care Management [phone]

## 2019-11-27 NOTE — Patient Instructions (Addendum)
Thank you allowing the Chronic Care Management Team to be a part of your care! It was a pleasure speaking with you today!  1. Please call this social worker with any questions or concerns regarding your mental health needs  CCM (Chronic Care Management) Team   Neldon Labella RN, BSN Nurse Care Coordinator  [phone]  Ruben Reason PharmD  Clinical Pharmacist  [phone]   [place], LCSW Clinical Social Worker [phone]  Goals Addressed            This Visit's Progress   . "I need help managing my stress" (pt-stated)       Current Barriers:  . Acute Mental Health needs related to adjustment to spouse's medical condition and family conflicts  . Mental Health Concerns  . Suicidal Ideation/Homicidal Ideation: No  Clinical Social Work Goal(s):  Marland Kitchen Over the next 90 days, patient will work with SW bi-weekly by telephone or in person to reduce or manage symptoms related to stress management   Interventions: . Status of patient's stress level discussed as well as possible triggers . Confirmed that patient was doing much better due to son and daughter's surgery being a success as well as the delivery of her great grand daughter  . Discussed current coping strategies used, (crafting, prayer, reaching out to friends)  exploring alternative perspectives . Discussed plan to go the beach for the next month with spouse (11/30/19-12/16/19) . Positive Reinforcement provided in regards to the development of a self care plan . Discussed plans with patient for ongoing care management follow up and provided patient with direct contact information for care management team if needed in the future . Emotional/Supportive Counseling provided related to family stress experienced allowing her to vent her frustrations and celebrate her sucessess  Patient Self Care Activities:  . Performs ADL's independently . Performs IADL's independently . Ability for insight  Patient Coping  Strengths:  . Supportive Relationships . Friends . Church . Hopefulness . Able to Communicate Effectively  Patient Self Care Deficits:  . Difficulty verbalizing plan for self care  Please see past updates related to this goal by clicking on the "Past Updates" button in the selected goal          The patient verbalized understanding of instructions provided today and declined a print copy of patient instruction materials.   No further follow up required: patient to call if the need arises in the future

## 2019-11-27 NOTE — Telephone Encounter (Signed)
Requested medication (s) are due for refill today: yes  Requested medication (s) are on the active medication list: yes  Last refill: 11/03/2019  Future visit scheduled: yes  Notes to clinic: This refill cannot be delegated   Requested Prescriptions  Pending Prescriptions Disp Refills   ALPRAZolam (XANAX) 0.5 MG tablet [Pharmacy Med Name: ALPRAZolam 0.5 MG Oral Tablet] 60 tablet 0    Sig: TAKE 1 TABLET BY MOUTH EVERY 12 HOURS AS NEEDED      Not Delegated - Psychiatry:  Anxiolytics/Hypnotics Failed - 11/27/2019  9:03 AM      Failed - This refill cannot be delegated      Failed - Urine Drug Screen completed in last 360 days.      Passed - Valid encounter within last 6 months    Recent Outpatient Visits           4 months ago Need for influenza vaccination   [facility] [place], Dionne Bucy, MD   6 months ago Essential (primary) hypertension   [facility], Dionne Bucy, MD   6 months ago Transient alteration of awareness   [facility] [place] M[place] M, [place]   7 months ago [place], [place], PA-C   9 months ago Essential (primary) hypertension   [facility] [facility], Dionne Bucy, MD       Future Appointments             In 4 weeks [facility]               sertraline (ZOLOFT) 100 MG tablet [Pharmacy Med Name: Sertraline HCl 100 MG Oral Tablet] 30 tablet 0    Sig: Take 1 tablet by mouth once daily      Psychiatry:  Antidepressants - SSRI Passed - 11/27/2019  9:03 AM      Passed - Completed PHQ-2 or PHQ-9 in the last 360 days.      Passed - Valid encounter within last 6 months    Recent Outpatient Visits           4 months ago Need for influenza vaccination   [facility] [place], Dionne Bucy, MD   6 months ago Essential (primary) hypertension   [facility] [place], Dionne Bucy, MD   6 months ago Transient  alteration of awareness   [facility] [place], [place]   7 months ago Confusion   [place], [place], [place]   9 months ago Essential (primary) hypertension   [employer] [facility], Dionne Bucy, MD       Future Appointments             In 4 weeks [facility], [place]

## 2019-11-28 NOTE — Telephone Encounter (Signed)
LM stating that would be fine to change in office apt to a telephonic AWV. Requested a CB with number to call for apt. Ok to leave detailed message per [facility].

## 2019-11-29 ENCOUNTER — Encounter: Payer: Self-pay | Admitting: Obstetrics and Gynecology

## 2019-11-29 ENCOUNTER — Other Ambulatory Visit: Payer: Self-pay

## 2019-11-29 ENCOUNTER — Ambulatory Visit (INDEPENDENT_AMBULATORY_CARE_PROVIDER_SITE_OTHER): Payer: PPO | Admitting: Obstetrics and Gynecology

## 2019-11-29 VITALS — BP 180/100 | Ht 69.0 in | Wt 180.0 lb

## 2019-11-29 DIAGNOSIS — N3946 Mixed incontinence: Secondary | ICD-10-CM

## 2019-11-29 DIAGNOSIS — Z1211 Encounter for screening for malignant neoplasm of colon: Secondary | ICD-10-CM | POA: Diagnosis not present

## 2019-11-29 DIAGNOSIS — N8110 Cystocele, unspecified: Secondary | ICD-10-CM

## 2019-11-29 DIAGNOSIS — Z4689 Encounter for fitting and adjustment of other specified devices: Secondary | ICD-10-CM

## 2019-11-29 DIAGNOSIS — N952 Postmenopausal atrophic vaginitis: Secondary | ICD-10-CM

## 2019-11-29 MED ORDER — PREMARIN 0.625 MG/GM VA CREA: 1.0000 | TOPICAL_CREAM | VAGINAL | 12 refills | Status: DC

## 2019-11-29 NOTE — Progress Notes (Signed)
HPI:      [name] is a 79 y.o. 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  Pt reports tolerating the pessary well with  no vaginal bleeding and  no vaginal discharge.  Symptoms of pelvic floor weakening have greatly improved. She is voiding and defecating without difficulty. She currently has a 2 ring pessary with a knob. She reports that she has   PMHx: She  has a past medical history of Adenomatous polyps, Anxiety, Arthritis, Capsulitis of toe of right foot, Chest pain, Colon polyps, GERD (gastroesophageal reflux disease), History of colonic polyps, HTN (hypertension), Hyperlipidemia, and PUD (peptic ulcer disease). Also,  has a past surgical history that includes Cholecystectomy; hysterectomy-unspecified area; Cervical discectomy; Abdominal hysterectomy; Eye surgery; Colonoscopy with propofol (N/A, 10/06/2016); Colonoscopy with propofol (N/A, 12/30/2018); Laparoscopic right colectomy (Right, 01/23/2019); Breast biopsy (Right, 2011); and Breast biopsy (Right, 06/29/2015)., family history includes Bipolar disorder in her son; COPD in her mother; Colon cancer (age of onset: 52) in her mother; Colon polyps in her daughter; Diabetes in her father; Heart attack in her mother; Heart failure in her father; Hypertension in her brother and son; Irritable bowel syndrome in her daughter; Kidney Stones in her son; Kidney cancer in an other family member; Kidney failure in her father; Macular degeneration in her mother; Melanoma in her son; Osteoporosis in her mother; Ulcers in her mother.,  reports that she has never smoked. She has never used smokeless tobacco. She reports current alcohol use. She reports that she does not use drugs.  She has a current medication list which includes the following prescription(s): acetaminophen, alprazolam, ascorbic acid, aspirin, atorvastatin, fluticasone, genteal tears, ketotifen, losartan, multiple  vitamins-minerals, omeprazole, polyethylene glycol powder, sertraline, shingrix, and [START ON 11/30/2019] premarin. Also, is allergic to morphine and related; percocet  [oxycodone-acetaminophen]; levaquin [levofloxacin in d5w]; lisinopril; meloxicam; penicillins; and preservision areds 2 [multiple vitamins-minerals].  Review of Systems  Constitutional: Negative for chills, fever, malaise/fatigue and weight loss.  HENT: Negative for congestion, hearing loss and sinus pain.   Eyes: Negative for blurred vision and double vision.  Respiratory: Negative for cough, sputum production, shortness of breath and wheezing.   Cardiovascular: Negative for chest pain, palpitations, orthopnea and leg swelling.  Gastrointestinal: Negative for abdominal pain, constipation, diarrhea, nausea and vomiting.  Genitourinary: Negative for dysuria, flank pain, frequency, hematuria and urgency.  Musculoskeletal: Negative for back pain, falls and joint pain.  Skin: Negative for itching and rash.  Neurological: Negative for dizziness and headaches.  Psychiatric/Behavioral: Negative for depression, substance abuse and suicidal ideas. The patient is not nervous/anxious.     Objective: BP (!) 180/100   Ht 5\' 9"  (1.753 m)   Wt 180 lb (81.6 kg)   BMI 26.58 kg/m  Physical Exam Constitutional:      Appearance: She is well-developed.  Genitourinary:     Vagina and uterus normal.     No lesions in the vagina.     No cervical motion tenderness.     No right or left adnexal mass present.     Genitourinary Comments: External: Normal appearing vulva. No lesions noted.  Speculum examination: Normal appearing vaginal vault. No erosion.      HENT:     Head: Normocephalic and atraumatic.  Neck:     Thyroid: No thyromegaly.  Cardiovascular:     Rate and Rhythm: Normal rate and regular rhythm.     Heart sounds: Normal heart sounds.  Pulmonary:     Effort: Pulmonary effort is normal.     Breath sounds: Normal breath  sounds.  Chest:     Breasts:        Right: No inverted nipple, mass, nipple discharge or skin change.        Left: No inverted nipple, mass, nipple discharge or skin change.  Abdominal:     General: Bowel sounds are normal. There is no distension.     Palpations: Abdomen is soft. There is no mass.  Musculoskeletal:     Cervical back: Neck supple.  Neurological:     Mental Status: She is alert and oriented to person, place, and time.  Skin:    General: Skin is warm and dry.  Psychiatric:        Behavior: Behavior normal.        Thought Content: Thought content normal.        Judgment: Judgment normal.  Vitals reviewed.   Pessary Care Pessary removed and cleaned.  Vagina checked - without erosions - pessary replaced.  A/P:  Pessary was cleaned and replaced today. Instructions given for care. Concerning symptoms to observe for are counseled to patient. Discussed urinary urgency, will start with behavior modification and timed voids every 2-3 hours, limit fluid before bed.  Referral to GI. Having loose bowel movements, hx of colectomy.  Follow up scheduled for 3 months.  A total of 15 minutes were spent face-to-face with the patient during this encounter and over half of that time dealt with counseling and coordination of care.  Adrian Prows MD [facility], [facility] 11/29/2019 2:48 PM

## 2019-11-30 ENCOUNTER — Ambulatory Visit: Payer: PPO | Admitting: Obstetrics and Gynecology

## 2019-12-07 ENCOUNTER — Encounter: Payer: Self-pay | Admitting: *Deleted

## 2019-12-13 NOTE — Telephone Encounter (Signed)
Spoke with pt yesterday and changed apt notes to a telephonic visit on 12/26/19 @ 10:40 AM.

## 2019-12-14 DIAGNOSIS — R293 Abnormal posture: Secondary | ICD-10-CM | POA: Diagnosis not present

## 2019-12-14 DIAGNOSIS — M2560 Stiffness of unspecified joint, not elsewhere classified: Secondary | ICD-10-CM | POA: Diagnosis not present

## 2019-12-14 DIAGNOSIS — M545 Low back pain: Secondary | ICD-10-CM | POA: Diagnosis not present

## 2019-12-14 DIAGNOSIS — M9903 Segmental and somatic dysfunction of lumbar region: Secondary | ICD-10-CM | POA: Diagnosis not present

## 2019-12-15 DIAGNOSIS — M2560 Stiffness of unspecified joint, not elsewhere classified: Secondary | ICD-10-CM | POA: Diagnosis not present

## 2019-12-15 DIAGNOSIS — M9903 Segmental and somatic dysfunction of lumbar region: Secondary | ICD-10-CM | POA: Diagnosis not present

## 2019-12-15 DIAGNOSIS — R293 Abnormal posture: Secondary | ICD-10-CM | POA: Diagnosis not present

## 2019-12-15 DIAGNOSIS — M545 Low back pain: Secondary | ICD-10-CM | POA: Diagnosis not present

## 2019-12-18 DIAGNOSIS — M9903 Segmental and somatic dysfunction of lumbar region: Secondary | ICD-10-CM | POA: Diagnosis not present

## 2019-12-18 DIAGNOSIS — R293 Abnormal posture: Secondary | ICD-10-CM | POA: Diagnosis not present

## 2019-12-18 DIAGNOSIS — M545 Low back pain: Secondary | ICD-10-CM | POA: Diagnosis not present

## 2019-12-18 DIAGNOSIS — M2560 Stiffness of unspecified joint, not elsewhere classified: Secondary | ICD-10-CM | POA: Diagnosis not present

## 2019-12-19 DIAGNOSIS — M9903 Segmental and somatic dysfunction of lumbar region: Secondary | ICD-10-CM | POA: Diagnosis not present

## 2019-12-19 DIAGNOSIS — R293 Abnormal posture: Secondary | ICD-10-CM | POA: Diagnosis not present

## 2019-12-19 DIAGNOSIS — M545 Low back pain: Secondary | ICD-10-CM | POA: Diagnosis not present

## 2019-12-19 DIAGNOSIS — M2560 Stiffness of unspecified joint, not elsewhere classified: Secondary | ICD-10-CM | POA: Diagnosis not present

## 2019-12-22 DIAGNOSIS — M545 Low back pain: Secondary | ICD-10-CM | POA: Diagnosis not present

## 2019-12-22 DIAGNOSIS — R293 Abnormal posture: Secondary | ICD-10-CM | POA: Diagnosis not present

## 2019-12-22 DIAGNOSIS — M2560 Stiffness of unspecified joint, not elsewhere classified: Secondary | ICD-10-CM | POA: Diagnosis not present

## 2019-12-22 DIAGNOSIS — M9903 Segmental and somatic dysfunction of lumbar region: Secondary | ICD-10-CM | POA: Diagnosis not present

## 2019-12-25 DIAGNOSIS — M9903 Segmental and somatic dysfunction of lumbar region: Secondary | ICD-10-CM | POA: Diagnosis not present

## 2019-12-25 DIAGNOSIS — R293 Abnormal posture: Secondary | ICD-10-CM | POA: Diagnosis not present

## 2019-12-25 DIAGNOSIS — M2560 Stiffness of unspecified joint, not elsewhere classified: Secondary | ICD-10-CM | POA: Diagnosis not present

## 2019-12-25 DIAGNOSIS — M545 Low back pain: Secondary | ICD-10-CM | POA: Diagnosis not present

## 2019-12-25 NOTE — Progress Notes (Signed)
Subjective:   Rita Cruz is a 79 y.o. female who presents for Medicare Annual (Subsequent) preventive examination.    This visit is being conducted through telemedicine due to the COVID-19 pandemic. This patient has given me verbal consent via doximity to conduct this visit, patient states they are participating from their home address. Some vital signs may be absent or patient reported.    Patient identification: identified by name, DOB, and current address  Review of Systems:  N/A  Cardiac Risk Factors include: advanced age (>39men, >39 women);dyslipidemia;hypertension     Objective:     Vitals: There were no vitals taken for this visit.  There is no height or weight on file to calculate BMI. Unable to obtain vitals due to visit being conducted via telephonically.   Advanced Directives 12/26/2019 01/23/2019 01/23/2019 01/19/2019 12/30/2018 12/20/2018 12/07/2017  Does Patient Have a Medical Advance Directive? Yes Yes Yes Yes Yes Yes Yes  Type of Paramedic of [place];Living will Healthcare Power of [place];Living will [place];Living will  Does patient want to make changes to medical advance directive? - No - Patient declined - No - Patient declined - - -  Copy of Force in Chart? Yes - validated most recent copy scanned in chart (See row information) Yes - validated most recent copy scanned in chart (See row information) Yes - validated most recent copy scanned in chart (See row information) Yes - validated most recent copy scanned in chart (See row information) - Yes - validated most recent copy scanned in chart (See row information) Yes  Would patient like information on creating a medical advance directive? - - - - - - -    Tobacco Social History   Tobacco Use  Smoking Status Never Smoker  Smokeless Tobacco Never Used   Tobacco Comment   does not smoke     Counseling given: Not Answered Comment: does not smoke   Clinical Intake:  Pre-visit preparation completed: Yes  Pain : 0-10 Pain Score: 2  Pain Type: Chronic pain(sciatica pains) Pain Location: Back(all over) Pain Orientation: Lower Pain Descriptors / Indicators: Aching, Spasm Pain Frequency: Intermittent     Nutritional Risks: None Diabetes: No  How often do you need to have someone help you when you read instructions, pamphlets, or other written materials from your doctor or pharmacy?: 1 - Never  Interpreter Needed?: No  Information entered by :: [facility], LPN  Past Medical History:  Diagnosis Date  . Adenomatous polyps   . Anxiety   . Arthritis    neck  . Capsulitis of toe of right foot   . Chest pain    Adenosine Myoview in 2006 done for atypical chest pain, showed no eviden e for ischemia or infarct  . Colon polyps   . GERD (gastroesophageal reflux disease)   . History of colonic polyps   . HTN (hypertension)   . Hyperlipidemia   . PUD (peptic ulcer disease)    with hx of H. Pylori   Past Surgical History:  Procedure Laterality Date  . ABDOMINAL HYSTERECTOMY    . BREAST BIOPSY Right 2011   benign mass done in Dr. [place] office  . BREAST BIOPSY Right 06/29/2015   top hat marker. PROLIFERATIVE FIBROCYSTIC CHANGE WITH ASSOCIATED   . CERVICAL DISCECTOMY    . CHOLECYSTECTOMY    . COLONOSCOPY WITH PROPOFOL N/A 10/06/2016   Procedure:  COLONOSCOPY WITH PROPOFOL;  Surgeon: Lollie Sails, MD;  Location: [facility] ENDOSCOPY;  Service: Endoscopy;  Laterality: N/A;  . COLONOSCOPY WITH PROPOFOL N/A 12/30/2018   Procedure: COLONOSCOPY WITH PROPOFOL;  Surgeon: Lollie Sails, MD;  Location[facility] ENDOSCOPY;  Service: Endoscopy;  Laterality: N/A;  . EYE SURGERY    . hysterectomy-unspecified area    . LAPAROSCOPIC RIGHT COLECTOMY Right 01/23/2019   Procedure: LAPAROSCOPIC RIGHT COLECTOMY;  Surgeon: Robert Bellow, MD;   Location: [facility] ORS;  Service: General;  Laterality: Right;   Family History  Problem Relation Age of Onset  . Kidney failure Father   . Heart failure Father   . Diabetes Father   . COPD Mother   . Colon cancer Mother 6  . Heart attack Mother   . Ulcers Mother   . Osteoporosis Mother   . Macular degeneration Mother   . Hypertension Brother   . Irritable bowel syndrome Daughter   . Colon polyps Daughter   . Kidney Stones Son   . Bipolar disorder Son   . Melanoma Son        stage IV  . Hypertension Son   . Kidney cancer Other   . Breast cancer Neg Hx    Social History   Socioeconomic History  . Marital status: Married    Spouse name: Not on file  . Number of children: 2  . Years of education: Not on file  . Highest education level: Some college, no degree  Occupational History  . Occupation: retired  Tobacco Use  . Smoking status: Never Smoker  . Smokeless tobacco: Never Used  . Tobacco comment: does not smoke  Substance and Sexual Activity  . Alcohol use: Yes    Alcohol/week: 7.0 standard drinks    Types: 7 Glasses of wine per week    Comment: 1 glass of wine nightly  . Drug use: No  . Sexual activity: Not Currently    Birth control/protection: Surgical  Other Topics Concern  . Not on file  Social History Narrative   Married-2nd marriage. She has 2 children and her husband has 2 children.    Social Determinants of Health   Financial Resource Strain: Low Risk   . Difficulty of Paying Living Expenses: Not hard at all  Food Insecurity: No Food Insecurity  . Worried About Charity fundraiser in the Last Year: Never true  . Ran Out of Food in the Last Year: Never true  Transportation Needs: No Transportation Needs  . Lack of Transportation (Medical): No  . Lack of Transportation (Non-Medical): No  Physical Activity: Sufficiently Active  . Days of Exercise per Week: 4 days  . Minutes of Exercise per Session: 150+ min  Stress: No Stress Concern Present  .  Feeling of Stress : Not at all  Social Connections: Not Isolated  . Frequency of Communication with Friends and Family: Three times a week  . Frequency of Social Gatherings with Friends and Family: Three times a week  . Attends Religious Services: 1 to 4 times per year  . Active Member of Clubs or Organizations: Yes  . Attends Archivist Meetings: 1 to 4 times per year  . Marital Status: Married    Outpatient Encounter Medications as of 12/26/2019  Medication Sig  . acetaminophen (TYLENOL) 325 MG tablet Take 650 mg by mouth every 6 (six) hours as needed for moderate pain.  Marland Kitchen ALPRAZolam (XANAX) 0.5 MG tablet TAKE 1 TABLET BY MOUTH EVERY 12 HOURS AS  NEEDED  . ascorbic Acid (VITAMIN C) 500 MG CPCR Take 500 mg by mouth daily.   Marland Kitchen aspirin (ASPIRIN EC LO-DOSE) 81 MG EC tablet Take 81 mg by mouth at bedtime.   Marland Kitchen atorvastatin (LIPITOR) 10 MG tablet Take 1 tablet (10 mg total) by mouth daily.  Marland Kitchen co-enzyme Q-10 30 MG capsule Take by mouth daily. Unsure dose  . conjugated estrogens (PREMARIN) vaginal cream Place 1 Applicatorful vaginally 2 (two) times a week.  . fluticasone (FLONASE) 50 MCG/ACT nasal spray Place 2 sprays into both nostrils daily as needed for allergies.  Marland Kitchen GENTEAL TEARS 0.1-0.3 % SOLN as needed.   Marland Kitchen ketotifen (ZADITOR) 0.025 % ophthalmic solution Place 1 drop into both eyes daily as needed (allergies).  . losartan (COZAAR) 50 MG tablet TAKE 1 & 1/2 (ONE & ONE-HALF) TABLETS BY MOUTH ONCE DAILY  . Multiple Vitamins-Minerals (PRESERVISION AREDS 2 PO) Take 1 tablet by mouth 2 (two) times daily.  Marland Kitchen omeprazole (PRILOSEC) 20 MG capsule Take 1 capsule by mouth once daily  . sertraline (ZOLOFT) 100 MG tablet Take 1 tablet by mouth once daily  . polyethylene glycol powder (GLYCOLAX/MIRALAX) 17 GM/SCOOP powder polyethylene glycol 3350 17 gram/dose oral powder  . SHINGRIX injection    No facility-administered encounter medications on file as of 12/26/2019.    Activities of Daily  Living In your present state of health, do you have any difficulty performing the following activities: 12/26/2019 01/23/2019  Hearing? N N  Vision? 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 -  Difficulty concentrating or making decisions? N N  Walking or climbing stairs? Y N  Comment Due to lower back pain. -  Dressing or bathing? N N  Doing errands, shopping? N N  Preparing Food and eating ? N -  Using the Toilet? N -  In the past six months, have you accidently leaked urine? N -  Comment Has a pessary. -  Do you have problems with loss of bowel control? N -  Managing your Medications? N -  Managing your Finances? N -  Housekeeping or managing your Housekeeping? Y -  Some recent data might be hidden    Patient Care Team: Brita Romp Dionne Bucy, MD as PCP - General (Family Medicine) Birder Robson, MD as Referring Physician (Ophthalmology) Sharlet Salina, MD as Referring Physician (Physical Medicine and Rehabilitation) Margaretha Sheffield, MD (Otolaryngology) Lollie Sails, MD as Consulting Physician (Gastroenterology) Vern Claude, LCSW as [place] Management Lucilla Lame, MD as Consulting Physician (Gastroenterology) Bary Castilla Forest Gleason, MD as Consulting Physician (General Surgery)    Assessment:   This is a routine wellness examination for Rita Cruz.  Exercise Activities and Dietary recommendations Current Exercise Habits: Home exercise routine, Type of exercise: walking;stretching, Time (Minutes): > 60, Frequency (Times/Week): 4, Weekly Exercise (Minutes/Week): 0, Intensity: Mild, Exercise limited by: orthopedic condition(s)  Goals      Patient Stated   . "I need help managing my stress" (pt-stated)     Current Barriers:  . Acute Mental Health needs related to adjustment to spouse's medical condition and family conflicts  . Mental Health Concerns  . Suicidal Ideation/Homicidal Ideation: No  Clinical Social Work Goal(s):  Marland Kitchen Over the next 90 days,  patient will work with SW bi-weekly by telephone or in person to reduce or manage symptoms related to stress management   Interventions: . Status of patient's stress level discussed as well as possible triggers . Confirmed that patient was doing much better  due to son and daughter's surgery being a success as well as the delivery of her great grand daughter  . Discussed current coping strategies used, (crafting, prayer, reaching out to friends)  exploring alternative perspectives . Discussed plan to go the beach for the next month with spouse (11/30/19-12/16/19) . Positive Reinforcement provided in regards to the development of a self care plan including re-arranging medical appointments in-order to extend time at the beach . Discussed plans with patient for ongoing care management follow up and provided patient with direct contact information for care management team if needed in the future . Emotional/Supportive Counseling provided related to family stress experienced allowing her to vent her frustrations and celebrate her sucessess  Patient Self Care Activities:  . Performs ADL's independently . Performs IADL's independently . Ability for insight  Patient Coping Strengths:  . Supportive Relationships . Friends . Church . Hopefulness . Able to Communicate Effectively  Patient Self Care Deficits:  . Difficulty verbalizing plan for self care  Please see past updates related to this goal by clicking on the "Past Updates" button in the selected goal        Other   . DIET - DECREASE SODA OR JUICE INTAKE     Recommend to cut back to 1 soda a day and increase water intake to 4-6 8 oz glasses a day.     Marland Kitchen DIET - INCREASE WATER INTAKE     Recommend increasing water intake 6-8 glasses of water a day.        Fall Risk: Fall Risk  12/26/2019 05/01/2019 02/02/2019 01/17/2019 12/20/2018  Falls in the past year? 0 1 0 0 0  Number falls in past yr: 0 0 - 0 -  Injury with Fall? 0 0 - - -  Follow  up - - - Falls evaluation completed -    FALL RISK PREVENTION PERTAINING TO THE HOME:  Any stairs in or around the home? Yes  If so, are there any without handrails? No   Home free of loose throw rugs in walkways, pet beds, electrical cords, etc? Yes  Adequate lighting in your home to reduce risk of falls? Yes   ASSISTIVE DEVICES UTILIZED TO PREVENT FALLS:  Life alert? No  Use of a cane, walker or w/c? No  Grab bars in the bathroom? No  Shower chair or bench in shower? Yes  Elevated toilet seat or a handicapped toilet? No    TIMED UP AND GO:  Was the test performed? No .    Depression Screen PHQ 2/9 Scores 10/13/2019 07/21/2019 05/01/2019 12/20/2018  PHQ - 2 Score 1 1 1 1   PHQ- 9 Score - 4 - 2     Cognitive Function     6CIT Screen 12/26/2019 12/20/2018 12/07/2017 12/08/2016  What Year? 0 points 0 points 0 points 0 points  What month? 0 points 0 points 0 points 0 points  What time? 0 points 0 points 0 points 0 points  Count back from 20 0 points 0 points 0 points 0 points  Months in reverse 0 points 0 points 0 points 0 points  Repeat phrase 0 points 2 points 0 points 0 points  Total Score 0 2 0 0    Immunization History  Administered Date(s) Administered  . Fluad Quad(high Dose 65+) 07/21/2019  . Influenza, High Dose Seasonal PF 08/25/2017, 08/13/2018  . Pneumococcal Conjugate-13 05/17/2014  . Pneumococcal Polysaccharide-23 04/11/2012  . Td 04/08/2010  . Tdap 04/11/2012  . Zoster 02/04/2009  .  Zoster Recombinat (Shingrix) 06/14/2019, 09/07/2019    Qualifies for Shingles Vaccine? Completed series  Tdap: Up to date  Flu Vaccine: Up to date  Pneumococcal Vaccine: Completed series  Screening Tests Health Maintenance  Topic Date Due  . COLONOSCOPY  12/31/2019  . TETANUS/TDAP  04/11/2022  . DEXA SCAN  04/26/2024  . INFLUENZA VACCINE  Completed  . PNA vac Low Risk Adult  Completed    Cancer Screenings:  Colorectal Screening: Completed 12/30/18. Repeat every  year.   Mammogram: No longer required.   Bone Density: Completed 04/27/19. Results reflect OSTEOPENIA. Repeat every 5 years.   Lung Cancer Screening: (Low Dose CT Chest recommended if Age 56-80 years, 30 pack-year currently smoking OR have quit w/in 15years.) does not qualify.   Additional Screening:  Vision Screening: Recommended annual ophthalmology exams for early detection of glaucoma and other disorders of the eye.  Dental Screening: Recommended annual dental exams for proper oral hygiene  Community Resource Referral:  CRR required this visit?  No       Plan:  I have personally reviewed and addressed the Medicare Annual Wellness questionnaire and have noted the following in the patient's chart:  A. Medical and social history B. Use of alcohol, tobacco or illicit drugs  C. Current medications and supplements D. Functional ability and status E.  Nutritional status F.  Physical activity G. Advance directives H. List of other physicians I.  Hospitalizations, surgeries, and ER visits in previous 12 months J.  [place] such as hearing and vision if needed, cognitive and depression L. Referrals and appointments   In addition, I have reviewed and discussed with patient certain preventive protocols, quality metrics, and best practice recommendations. A written personalized care plan for preventive services as well as general preventive health recommendations were provided to patient. Nurse Health Advisor  Signed,    [place], [place]  [id number] Nurse Health Advisor   Nurse Notes: None.

## 2019-12-26 ENCOUNTER — Other Ambulatory Visit: Payer: Self-pay

## 2019-12-26 ENCOUNTER — Ambulatory Visit (INDEPENDENT_AMBULATORY_CARE_PROVIDER_SITE_OTHER): Payer: PPO

## 2019-12-26 DIAGNOSIS — Z0000 Encounter for general adult medical examination without abnormal findings: Secondary | ICD-10-CM | POA: Diagnosis not present

## 2019-12-26 DIAGNOSIS — R293 Abnormal posture: Secondary | ICD-10-CM | POA: Diagnosis not present

## 2019-12-26 DIAGNOSIS — M545 Low back pain: Secondary | ICD-10-CM | POA: Diagnosis not present

## 2019-12-26 DIAGNOSIS — M2560 Stiffness of unspecified joint, not elsewhere classified: Secondary | ICD-10-CM | POA: Diagnosis not present

## 2019-12-26 DIAGNOSIS — M9903 Segmental and somatic dysfunction of lumbar region: Secondary | ICD-10-CM | POA: Diagnosis not present

## 2019-12-26 NOTE — Patient Instructions (Signed)
Rita Cruz , Thank you for taking time to come for your Medicare Wellness Visit. I appreciate your ongoing commitment to your health goals. Please review the following plan we discussed and let me know if I can assist you in the future.   Screening recommendations/referrals: Colonoscopy: Up to date, due 12/31/2019 Mammogram: No longer required.  Bone Density: Up to date, due 04/2024 Recommended yearly ophthalmology/optometry visit for glaucoma screening and checkup Recommended yearly dental visit for hygiene and checkup  Vaccinations: Influenza vaccine: Up to date Pneumococcal vaccine: Completed series Tdap vaccine: Up to date, due 03/2022 Shingles vaccine: Completed series    Advanced directives: Currently on file.  Conditions/risks identified: Recommend increasing water intake to 6-8 8 oz glasses a day and decreasing soda intake to no more than one a day.   Next appointment: 01/23/20 @ 9:00 AM with Dr Brita Romp.    Preventive Care 40 Years and Older, Female Preventive care refers to lifestyle choices and visits with your health care provider that can promote health and wellness. What does preventive care include?  A yearly physical exam. This is also called an annual well check.  Dental exams once or twice a year.  Routine eye exams. Ask your health care provider how often you should have your eyes checked.  Personal lifestyle choices, including:  Daily care of your teeth and gums.  Regular physical activity.  Eating a healthy diet.  Avoiding tobacco and drug use.  Limiting alcohol use.  Practicing safe sex.  Taking low-dose aspirin every day.  Taking vitamin and mineral supplements as recommended by your health care provider. What happens during an annual well check? The services and screenings done by your health care provider during your annual well check will depend on your age, overall health, lifestyle risk factors, and family history of disease. Counseling   Your health care provider may ask you questions about your:  Alcohol use.  Tobacco use.  Drug use.  Emotional well-being.  Home and relationship well-being.  Sexual activity.  Eating habits.  History of falls.  Memory and ability to understand (cognition).  Work and work Statistician.  Reproductive health. Screening  You may have the following tests or measurements:  Height, weight, and BMI.  Blood pressure.  Lipid and cholesterol levels. These may be checked every 5 years, or more frequently if you are over 12 years old.  Skin check.  Lung cancer screening. You may have this screening every year starting at age 48 if you have a 30-pack-year history of smoking and currently smoke or have quit within the past 15 years.  Fecal occult blood test (FOBT) of the stool. You may have this test every year starting at age 28.  Flexible sigmoidoscopy or colonoscopy. You may have a sigmoidoscopy every 5 years or a colonoscopy every 10 years starting at age 46.  Hepatitis C blood test.  Hepatitis B blood test.  Sexually transmitted disease (STD) testing.  Diabetes screening. This is done by checking your blood sugar (glucose) after you have not eaten for a while (fasting). You may have this done every 1-3 years.  Bone density scan. This is done to screen for osteoporosis. You may have this done starting at age 4.  Mammogram. This may be done every 1-2 years. Talk to your health care provider about how often you should have regular mammograms. Talk with your health care provider about your test results, treatment options, and if necessary, the need for more tests. Vaccines  Your health care  provider may recommend certain vaccines, such as:  Influenza vaccine. This is recommended every year.  Tetanus, diphtheria, and acellular pertussis (Tdap, Td) vaccine. You may need a Td booster every 10 years.  Zoster vaccine. You may need this after age 47.  Pneumococcal 13-valent  conjugate (PCV13) vaccine. One dose is recommended after age 31.  Pneumococcal polysaccharide (PPSV23) vaccine. One dose is recommended after age 50. Talk to your health care provider about which screenings and vaccines you need and how often you need them. This information is not intended to replace advice given to you by your health care provider. Make sure you discuss any questions you have with your health care provider. Document Released: 12/06/2015 Document Revised: 07/29/2016 Document Reviewed: 09/10/2015 Elsevier Interactive Patient Education  2017 [place] Prevention in the Home Falls can cause injuries. They can happen to people of all ages. There are many things you can do to make your home safe and to help prevent falls. What can I do on the outside of my home?  Regularly fix the edges of walkways and driveways and fix any cracks.  Remove anything that might make you trip as you walk through a door, such as a raised step or threshold.  Trim any bushes or trees on the path to your home.  Use bright outdoor lighting.  Clear any walking paths of anything that might make someone trip, such as rocks or tools.  Regularly check to see if handrails are loose or broken. Make sure that both sides of any steps have handrails.  Any raised decks and porches should have guardrails on the edges.  Have any leaves, snow, or ice cleared regularly.  Use sand or salt on walking paths during winter.  Clean up any spills in your garage right away. This includes oil or grease spills. What can I do in the bathroom?  Use night lights.  Install grab bars by the toilet and in the tub and shower. Do not use towel bars as grab bars.  Use non-skid mats or decals in the tub or shower.  If you need to sit down in the shower, use a plastic, non-slip stool.  Keep the floor dry. Clean up any water that spills on the floor as soon as it happens.  Remove soap buildup in the tub or  shower regularly.  Attach bath mats securely with double-sided non-slip rug tape.  Do not have throw rugs and other things on the floor that can make you trip. What can I do in the bedroom?  Use night lights.  Make sure that you have a light by your bed that is easy to reach.  Do not use any sheets or blankets that are too big for your bed. They should not hang down onto the floor.  Have a firm chair that has side arms. You can use this for support while you get dressed.  Do not have throw rugs and other things on the floor that can make you trip. What can I do in the kitchen?  Clean up any spills right away.  Avoid walking on wet floors.  Keep items that you use a lot in easy-to-reach places.  If you need to reach something above you, use a strong step stool that has a grab bar.  Keep electrical cords out of the way.  Do not use floor polish or wax that makes floors slippery. If you must use wax, use non-skid floor wax.  Do not have throw  rugs and other things on the floor that can make you trip. What can I do with my stairs?  Do not leave any items on the stairs.  Make sure that there are handrails on both sides of the stairs and use them. Fix handrails that are broken or loose. Make sure that handrails are as long as the stairways.  Check any carpeting to make sure that it is firmly attached to the stairs. Fix any carpet that is loose or worn.  Avoid having throw rugs at the top or bottom of the stairs. If you do have throw rugs, attach them to the floor with carpet tape.  Make sure that you have a light switch at the top of the stairs and the bottom of the stairs. If you do not have them, ask someone to add them for you. What else can I do to help prevent falls?  Wear shoes that:  Do not have high heels.  Have rubber bottoms.  Are comfortable and fit you well.  Are closed at the toe. Do not wear sandals.  If you use a stepladder:  Make sure that it is fully  opened. Do not climb a closed stepladder.  Make sure that both sides of the stepladder are locked into place.  Ask someone to hold it for you, if possible.  Clearly mark and make sure that you can see:  Any grab bars or handrails.  First and last steps.  Where the edge of each step is.  Use tools that help you move around (mobility aids) if they are needed. These include:  Canes.  Walkers.  Scooters.  Crutches.  Turn on the lights when you go into a dark area. Replace any light bulbs as soon as they burn out.  Set up your furniture so you have a clear path. Avoid moving your furniture around.  If any of your floors are uneven, fix them.  If there are any pets around you, be aware of where they are.  Review your medicines with your doctor. Some medicines can make you feel dizzy. This can increase your chance of falling. Ask your doctor what other things that you can do to help prevent falls. This information is not intended to replace advice given to you by your health care provider. Make sure you discuss any questions you have with your health care provider. Document Released: 09/05/2009 Document Revised: 04/16/2016 Document Reviewed: 12/14/2014 Elsevier Interactive Patient Education  2017 [employer].

## 2019-12-29 DIAGNOSIS — R293 Abnormal posture: Secondary | ICD-10-CM | POA: Diagnosis not present

## 2019-12-29 DIAGNOSIS — M545 Low back pain: Secondary | ICD-10-CM | POA: Diagnosis not present

## 2019-12-29 DIAGNOSIS — M2560 Stiffness of unspecified joint, not elsewhere classified: Secondary | ICD-10-CM | POA: Diagnosis not present

## 2019-12-29 DIAGNOSIS — M9903 Segmental and somatic dysfunction of lumbar region: Secondary | ICD-10-CM | POA: Diagnosis not present

## 2020-01-01 DIAGNOSIS — M2560 Stiffness of unspecified joint, not elsewhere classified: Secondary | ICD-10-CM | POA: Diagnosis not present

## 2020-01-01 DIAGNOSIS — M9903 Segmental and somatic dysfunction of lumbar region: Secondary | ICD-10-CM | POA: Diagnosis not present

## 2020-01-01 DIAGNOSIS — R293 Abnormal posture: Secondary | ICD-10-CM | POA: Diagnosis not present

## 2020-01-01 DIAGNOSIS — M545 Low back pain: Secondary | ICD-10-CM | POA: Diagnosis not present

## 2020-01-02 DIAGNOSIS — R293 Abnormal posture: Secondary | ICD-10-CM | POA: Diagnosis not present

## 2020-01-02 DIAGNOSIS — M9903 Segmental and somatic dysfunction of lumbar region: Secondary | ICD-10-CM | POA: Diagnosis not present

## 2020-01-02 DIAGNOSIS — M2560 Stiffness of unspecified joint, not elsewhere classified: Secondary | ICD-10-CM | POA: Diagnosis not present

## 2020-01-02 DIAGNOSIS — M545 Low back pain: Secondary | ICD-10-CM | POA: Diagnosis not present

## 2020-01-05 DIAGNOSIS — R293 Abnormal posture: Secondary | ICD-10-CM | POA: Diagnosis not present

## 2020-01-05 DIAGNOSIS — M9903 Segmental and somatic dysfunction of lumbar region: Secondary | ICD-10-CM | POA: Diagnosis not present

## 2020-01-05 DIAGNOSIS — M2560 Stiffness of unspecified joint, not elsewhere classified: Secondary | ICD-10-CM | POA: Diagnosis not present

## 2020-01-05 DIAGNOSIS — M545 Low back pain: Secondary | ICD-10-CM | POA: Diagnosis not present

## 2020-01-08 ENCOUNTER — Ambulatory Visit: Payer: PPO | Attending: Internal Medicine

## 2020-01-08 ENCOUNTER — Other Ambulatory Visit: Payer: Self-pay | Admitting: Family Medicine

## 2020-01-08 ENCOUNTER — Other Ambulatory Visit: Payer: Self-pay

## 2020-01-08 DIAGNOSIS — Z23 Encounter for immunization: Secondary | ICD-10-CM | POA: Insufficient documentation

## 2020-01-08 NOTE — Progress Notes (Signed)
   Cov[facility] Vaccination [facility]  Name:  Rita Cruz    MRN: [id number] DOB: July 30, 1941  01/08/2020  [name] was observed post Covid-19 immunization for 15 minutes without incidence. She was provided with Vaccine Information Sheet and instruction to access the V-Safe system.   [name] was instructed to call 911 with any severe reactions post vaccine: Marland Kitchen Difficulty breathing  . Swelling of your face and throat  . A fast heartbeat  . A bad rash all over your body  . Dizziness and weakness    Immunizations Administered    Name Date Dose VIS Date Route   Pfizer COVID-19 Vaccine 01/08/2020  8:21 AM 0.3 mL 11/03/2019 Intramuscular   Manufacturer: [place]   Lot: [id number]   [place]: [id number]

## 2020-01-10 ENCOUNTER — Telehealth: Payer: Self-pay | Admitting: Gastroenterology

## 2020-01-10 NOTE — Telephone Encounter (Signed)
Patient called & would like to schedule a colonoscopy with Dr Allen Norris. She has been a former patient of [facility] & would like to transfer her care here.

## 2020-01-11 ENCOUNTER — Other Ambulatory Visit: Payer: Self-pay

## 2020-01-11 DIAGNOSIS — Z1211 Encounter for screening for malignant neoplasm of colon: Secondary | ICD-10-CM

## 2020-01-11 DIAGNOSIS — Z86010 Personal history of colonic polyps: Secondary | ICD-10-CM

## 2020-01-11 NOTE — Telephone Encounter (Signed)
Gastroenterology Pre-Procedure Review  Request Date: Tuesday 01/30/20 Requesting Physician: Dr. Allen Norris  PATIENT REVIEW QUESTIONS: The patient responded to the following health history questions as indicated:    1. Are you having any GI issues?no 2. Do you have a personal history of Polyps? Yes 01/29/19 colon resection multiple polyps found 3. Do you have a family history of Colon Cancer or Polyps? Yes mother colon cancer at 78 years old, however lived to be 70.  Both brothers had colon polyps 4. Diabetes Mellitus? No 5. Joint replacements in the past 12 months?No 6. Major health problems in the past 3 months?Colon Resection 01/29/19 7. Any artificial heart valves, MVP, or defibrillator?No    MEDICATIONS & ALLERGIES:    Patient reports the following regarding taking any anticoagulation/antiplatelet therapy:   Plavix, Coumadin, Eliquis, Xarelto, Lovenox, Pradaxa, Brilinta, or Effient?No  Patient confirms/reports the following medications:  Current Outpatient Medications  Medication Sig Dispense Refill  . acetaminophen (TYLENOL) 325 MG tablet Take 650 mg by mouth every 6 (six) hours as needed for moderate pain.    Marland Kitchen ALPRAZolam (XANAX) 0.5 MG tablet TAKE 1 TABLET BY MOUTH EVERY 12 HOURS AS NEEDED 60 tablet 2  . ascorbic Acid (VITAMIN C) 500 MG CPCR Take 500 mg by mouth daily.     Marland Kitchen aspirin (ASPIRIN EC LO-DOSE) 81 MG EC tablet Take 81 mg by mouth at bedtime.     Marland Kitchen atorvastatin (LIPITOR) 10 MG tablet Take 1 tablet (10 mg total) by mouth daily. 90 tablet 3  . co-enzyme Q-10 30 MG capsule Take by mouth daily. Unsure dose    . conjugated estrogens (PREMARIN) vaginal cream Place 1 Applicatorful vaginally 2 (two) times a week. 42.5 g 12  . fluticasone (FLONASE) 50 MCG/ACT nasal spray Place 2 sprays into both nostrils daily as needed for allergies.    Marland Kitchen GENTEAL TEARS 0.1-0.3 % SOLN as needed.     Marland Kitchen ketotifen (ZADITOR) 0.025 % ophthalmic solution Place 1 drop into both eyes daily as needed (allergies).     . losartan (COZAAR) 50 MG tablet TAKE 1 & 1/2 (ONE & ONE-HALF) TABLETS BY MOUTH ONCE DAILY 135 tablet 0  . Multiple Vitamins-Minerals (PRESERVISION AREDS 2 PO) Take 1 tablet by mouth 2 (two) times daily.    Marland Kitchen omeprazole (PRILOSEC) 20 MG capsule Take 1 capsule by mouth once daily 90 capsule 1  . polyethylene glycol powder (GLYCOLAX/MIRALAX) 17 GM/SCOOP powder polyethylene glycol 3350 17 gram/dose oral powder    . sertraline (ZOLOFT) 100 MG tablet Take 1 tablet by mouth once daily 30 tablet 2  . SHINGRIX injection      No current facility-administered medications for this visit.    Patient confirms/reports the following allergies:  Allergies  Allergen Reactions  . Morphine And Related Other (See Comments)    Hallucinations   . Percocet  [Oxycodone-Acetaminophen] Other (See Comments)    Hallucinations Hallucinations  . Levaquin [Levofloxacin In D5w] Diarrhea    Vaginal itching  . Lisinopril     cough  . Meloxicam     Upset stomach  . Penicillins Itching  . Preservision Areds 2 [Multiple Vitamins-Minerals] Nausea Only    No orders of the defined types were placed in this encounter.   AUTHORIZATION INFORMATION Primary Insurance: 1D#: Group #:  Secondary Insurance: 1D#: Group #:  SCHEDULE INFORMATION: Date:  Time: Location:

## 2020-01-23 ENCOUNTER — Other Ambulatory Visit: Payer: Self-pay

## 2020-01-23 ENCOUNTER — Encounter: Payer: Self-pay | Admitting: Family Medicine

## 2020-01-23 ENCOUNTER — Ambulatory Visit (INDEPENDENT_AMBULATORY_CARE_PROVIDER_SITE_OTHER): Payer: PPO | Admitting: Family Medicine

## 2020-01-23 VITALS — BP 123/85 | HR 82 | Temp 96.9°F | Resp 16 | Ht 69.0 in | Wt 178.8 lb

## 2020-01-23 DIAGNOSIS — M8589 Other specified disorders of bone density and structure, multiple sites: Secondary | ICD-10-CM | POA: Diagnosis not present

## 2020-01-23 DIAGNOSIS — D122 Benign neoplasm of ascending colon: Secondary | ICD-10-CM | POA: Diagnosis not present

## 2020-01-23 DIAGNOSIS — Z1231 Encounter for screening mammogram for malignant neoplasm of breast: Secondary | ICD-10-CM

## 2020-01-23 DIAGNOSIS — Z0000 Encounter for general adult medical examination without abnormal findings: Secondary | ICD-10-CM | POA: Diagnosis not present

## 2020-01-23 DIAGNOSIS — F411 Generalized anxiety disorder: Secondary | ICD-10-CM

## 2020-01-23 DIAGNOSIS — E663 Overweight: Secondary | ICD-10-CM | POA: Diagnosis not present

## 2020-01-23 DIAGNOSIS — F418 Other specified anxiety disorders: Secondary | ICD-10-CM

## 2020-01-23 DIAGNOSIS — R739 Hyperglycemia, unspecified: Secondary | ICD-10-CM

## 2020-01-23 DIAGNOSIS — I10 Essential (primary) hypertension: Secondary | ICD-10-CM | POA: Diagnosis not present

## 2020-01-23 DIAGNOSIS — E7800 Pure hypercholesterolemia, unspecified: Secondary | ICD-10-CM

## 2020-01-23 DIAGNOSIS — J301 Allergic rhinitis due to pollen: Secondary | ICD-10-CM

## 2020-01-23 DIAGNOSIS — F329 Major depressive disorder, single episode, unspecified: Secondary | ICD-10-CM | POA: Diagnosis not present

## 2020-01-23 MED ORDER — FLUTICASONE PROPIONATE 50 MCG/ACT NA SUSP: 2.0000 | Freq: Every day | NASAL | 3 refills | Status: DC | PRN

## 2020-01-23 MED ORDER — SERTRALINE HCL 100 MG PO TABS: 100.0000 mg | ORAL_TABLET | Freq: Every day | ORAL | 3 refills | Status: DC

## 2020-01-23 MED ORDER — ALPRAZOLAM 0.5 MG PO TABS: 0.5000 mg | ORAL_TABLET | Freq: Two times a day (BID) | ORAL | 5 refills | Status: DC | PRN

## 2020-01-23 MED ORDER — OMEPRAZOLE 20 MG PO CPDR: 20.0000 mg | DELAYED_RELEASE_CAPSULE | Freq: Every day | ORAL | 3 refills | Status: DC

## 2020-01-23 MED ORDER — LOSARTAN POTASSIUM 50 MG PO TABS: 75.0000 mg | ORAL_TABLET | Freq: Every day | ORAL | 3 refills | Status: DC

## 2020-01-23 NOTE — Assessment & Plan Note (Signed)
Well controlled Continue current medications Recheck metabolic panel F/u in 6 months  

## 2020-01-23 NOTE — Patient Instructions (Signed)
Preventive Care 38 Years and Older, Female Preventive care refers to lifestyle choices and visits with your health care provider that can promote health and wellness. This includes:  A yearly physical exam. This is also called an annual well check.  Regular dental and eye exams.  Immunizations.  Screening for certain conditions.  Healthy lifestyle choices, such as diet and exercise. What can I expect for my preventive care visit? Physical exam Your health care provider will check:  Height and weight. These may be used to calculate body mass index (BMI), which is a measurement that tells if you are at a healthy weight.  Heart rate and blood pressure.  Your skin for abnormal spots. Counseling Your health care provider may ask you questions about:  Alcohol, tobacco, and drug use.  Emotional well-being.  Home and relationship well-being.  Sexual activity.  Eating habits.  History of falls.  Memory and ability to understand (cognition).  Work and work Statistician.  Pregnancy and menstrual history. What immunizations do I need?  Influenza (flu) vaccine  This is recommended every year. Tetanus, diphtheria, and pertussis (Tdap) vaccine  You may need a Td booster every 10 years. Varicella (chickenpox) vaccine  You may need this vaccine if you have not already been vaccinated. Zoster (shingles) vaccine  You may need this after age 33. Pneumococcal conjugate (PCV13) vaccine  One dose is recommended after age 33. Pneumococcal polysaccharide (PPSV23) vaccine  One dose is recommended after age 72. Measles, mumps, and rubella (MMR) vaccine  You may need at least one dose of MMR if you were born in 1957 or later. You may also need a second dose. Meningococcal conjugate (MenACWY) vaccine  You may need this if you have certain conditions. Hepatitis A vaccine  You may need this if you have certain conditions or if you travel or work in places where you may be exposed  to hepatitis A. Hepatitis B vaccine  You may need this if you have certain conditions or if you travel or work in places where you may be exposed to hepatitis B. Haemophilus influenzae type b (Hib) vaccine  You may need this if you have certain conditions. You may receive vaccines as individual doses or as more than one vaccine together in one shot (combination vaccines). Talk with your health care provider about the risks and benefits of combination vaccines. What tests do I need? Blood tests  Lipid and cholesterol levels. These may be checked every 5 years, or more frequently depending on your overall health.  Hepatitis C test.  Hepatitis B test. Screening  Lung cancer screening. You may have this screening every year starting at age 39 if you have a 30-pack-year history of smoking and currently smoke or have quit within the past 15 years.  Colorectal cancer screening. All adults should have this screening starting at age 36 and continuing until age 15. Your health care provider may recommend screening at age 23 if you are at increased risk. You will have tests every 1-10 years, depending on your results and the type of screening test.  Diabetes screening. This is done by checking your blood sugar (glucose) after you have not eaten for a while (fasting). You may have this done every 1-3 years.  Mammogram. This may be done every 1-2 years. Talk with your health care provider about how often you should have regular mammograms.  BRCA-related cancer screening. This may be done if you have a family history of breast, ovarian, tubal, or peritoneal cancers.  Other tests  Sexually transmitted disease (STD) testing.  Bone density scan. This is done to screen for osteoporosis. You may have this done starting at age 44. Follow these instructions at home: Eating and drinking  Eat a diet that includes fresh fruits and vegetables, whole grains, lean protein, and low-fat dairy products. Limit  your intake of foods with high amounts of sugar, saturated fats, and salt.  Take vitamin and mineral supplements as recommended by your health care provider.  Do not drink alcohol if your health care provider tells you not to drink.  If you drink alcohol: ? Limit how much you have to 0-1 drink a day. ? Be aware of how much alcohol is in your drink. In the [place] one drink equals one 12 oz bottle of beer (355 mL), one 5 oz glass of wine (148 mL), or one 1 oz glass of hard liquor (44 mL). Lifestyle  Take daily care of your teeth and gums.  Stay active. Exercise for at least 30 minutes on 5 or more days each week.  Do not use any products that contain nicotine or tobacco, such as cigarettes, e-cigarettes, and chewing tobacco. If you need help quitting, ask your health care provider.  If you are sexually active, practice safe sex. Use a condom or other form of protection in order to prevent STIs (sexually transmitted infections).  Talk with your health care provider about taking a low-dose aspirin or statin. What's next?  Go to your health care provider once a year for a well check visit.  Ask your health care provider how often you should have your eyes and teeth checked.  Stay up to date on all vaccines. This information is not intended to replace advice given to you by your health care provider. Make sure you discuss any questions you have with your health care provider. Document Revised: 11/03/2018 Document Reviewed: 11/03/2018 Elsevier Patient Education  2020 [employer].

## 2020-01-23 NOTE — Progress Notes (Signed)
Patient: Rita Cruz, Female    DOB: 04/01/1941, 79 y.o.   MRN: [id number] Visit Date: 01/24/2020  Today's Provider: [name], MD   Chief Complaint  Patient presents with  . Annual Exam   Subjective:     Complete Physical Rita Cruz is a 79 y.o. female. She feels well. She reports exercising yes. She reports she is sleeping well.  12/26/2019 AWE with McKenzie 04/27/2019 Mammogram-BI-RADS 2 04/27/2019 BMD-Osteopenia 12/30/2018 Colonoscopy-Polyps, diverticulosis 12/30/2018 Pathology-Tubular adenoma - repeat upcoming next week -----------------------------------------------------------   Review of Systems  Constitutional: Positive for diaphoresis.  HENT: Positive for postnasal drip and sinus pressure.   Eyes: Positive for visual disturbance.  Respiratory: Negative.   Cardiovascular: Positive for palpitations.  Gastrointestinal: Negative.   Endocrine: Negative.   Genitourinary: Positive for vaginal discharge.  Musculoskeletal: Positive for back pain, myalgias and neck pain.  Skin: Negative.   Allergic/Immunologic: Negative.   Neurological: Negative.   Hematological: Negative.   Psychiatric/Behavioral: Negative.     Social History   Socioeconomic History  . Marital status: Married    Spouse name: Not on file  . Number of children: 2  . Years of education: Not on file  . Highest education level: Some college, no degree  Occupational History  . Occupation: retired  Tobacco Use  . Smoking status: Never Smoker  . Smokeless tobacco: Never Used  . Tobacco comment: does not smoke  Substance and Sexual Activity  . Alcohol use: Yes    Alcohol/week: 7.0 standard drinks    Types: 7 Glasses of wine per week    Comment: 1 glass of wine nightly  . Drug use: No  . Sexual activity: Not Currently    Birth control/protection: Surgical  Other Topics Concern  . Not on file  Social History Narrative   Married-2nd marriage. She has 2 children  and her husband has 2 children.    Social Determinants of Health   Financial Resource Strain: Low Risk   . Difficulty of Paying Living Expenses: Not hard at all  Food Insecurity: No Food Insecurity  . Worried About Charity fundraiser in the Last Year: Never true  . Ran Out of Food in the Last Year: Never true  Transportation Needs: No Transportation Needs  . Lack of Transportation (Medical): No  . Lack of Transportation (Non-Medical): No  Physical Activity: Sufficiently Active  . Days of Exercise per Week: 4 days  . Minutes of Exercise per Session: 150+ min  Stress: No Stress Concern Present  . Feeling of Stress : Not at all  Social Connections: Not Isolated  . Frequency of Communication with Friends and Family: Three times a week  . Frequency of Social Gatherings with Friends and Family: Three times a week  . Attends Religious Services: 1 to 4 times per year  . Active Member of Clubs or Organizations: Yes  . Attends Archivist Meetings: 1 to 4 times per year  . Marital Status: Married  Human resources officer Violence: Not At Risk  . Fear of Current or Ex-Partner: No  . Emotionally Abused: No  . Physically Abused: No  . Sexually Abused: No    Past Medical History:  Diagnosis Date  . Adenomatous polyps   . Anxiety   . Arthritis    neck  . Bronchitis 02/04/2018  . Capsulitis of toe of right foot   . Chest pain    Adenosine Myoview in 2006 done for atypical chest pain, showed no eviden  e for ischemia or infarct  . Colon polyps   . GERD (gastroesophageal reflux disease)   . History of colonic polyps   . HTN (hypertension)   . Hyperlipidemia   . PUD (peptic ulcer disease)    with hx of H. Pylori     Patient Active Problem List   Diagnosis Date Noted  . Overweight 01/24/2020  . Osteopenia of multiple sites 01/24/2020  . Pelvic organ prolapse quantification stage 1 cystocele 07/25/2019  . 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 Tubulovillous adenoma polyp of  colon 01/23/2019  . Adenomatous polyp of ascending colon 01/18/2019  . Microcalcification of both breasts on mammogram 02/04/2018  . Allergic rhinitis 05/23/2015  . Arthropathy, lower leg 05/23/2015  . Benign neoplasm of colon 05/23/2015  . Clinical depression 05/23/2015  . Essential (primary) hypertension 05/23/2015  . Acid reflux 05/23/2015  . Anxiety, generalized 05/23/2015  . Hypercholesteremia 05/23/2015  . Blood glucose elevated 05/23/2015  . Symptomatic menopausal or female climacteric states 05/23/2015  . Arthritis, degenerative 05/23/2015  . Mixed incontinence 03/24/2013    Past Surgical History:  Procedure Laterality Date  . ABDOMINAL HYSTERECTOMY    . BREAST BIOPSY Right 2011   benign mass done in Dr. [place] office  . BREAST BIOPSY Right 06/29/2015   top hat marker. PROLIFERATIVE FIBROCYSTIC CHANGE WITH ASSOCIATED   . CERVICAL DISCECTOMY    . CHOLECYSTECTOMY    . COLONOSCOPY WITH PROPOFOL N/A 10/06/2016   Procedure: COLONOSCOPY WITH PROPOFOL;  Surgeon: Lollie Sails, MD;  [facility] ENDOSCOPY;  Service: Endoscopy;  Laterality: N/A;  . COLONOSCOPY WITH PROPOFOL N/A 12/30/2018   Procedure: COLONOSCOPY WITH PROPOFOL;  Surgeon: Lollie Sails, MD;  Location: [facility] ENDOSCOPY;  Service: Endoscopy;  Laterality: N/A;  . EYE SURGERY    . hysterectomy-unspecified area    . LAPAROSCOPIC RIGHT COLECTOMY Right 01/23/2019   Procedure: LAPAROSCOPIC RIGHT COLECTOMY;  Surgeon: Robert Bellow, MD;  Location: [facility] ORS;  Service: General;  Laterality: Right;    Her family history includes Bipolar disorder in her son; COPD in her mother; Colon cancer (age of onset: 41) in her mother; Colon polyps in her daughter; Diabetes in her father; Heart attack in her mother; Heart failure in her father; Hypertension in her brother and son; Irritable bowel syndrome in her daughter; Kidney Stones in her son; Kidney cancer in an other family member; Kidney failure in her father; Macular  degeneration in her mother; Melanoma in her son; Osteoporosis in her mother; Ulcers in her mother. There is no history of Breast cancer.   Current Outpatient Medications:  .  acetaminophen (TYLENOL) 325 MG tablet, Take 650 mg by mouth every 6 (six) hours as needed for moderate pain., Disp: , Rfl:  .  ALPRAZolam (XANAX) 0.5 MG tablet, Take 1 tablet (0.5 mg total) by mouth every 12 (twelve) hours as needed., Disp: 60 tablet, Rfl: 5 .  aspirin (ASPIRIN EC LO-DOSE) 81 MG EC tablet, Take 81 mg by mouth at bedtime. , Disp: , Rfl:  .  atorvastatin (LIPITOR) 10 MG tablet, Take 1 tablet (10 mg total) by mouth daily., Disp: 90 tablet, Rfl: 3 .  co-enzyme Q-10 30 MG capsule, Take by mouth daily. Unsure dose, Disp: , Rfl:  .  conjugated estrogens (PREMARIN) vaginal cream, Place 1 Applicatorful vaginally 2 (two) times a week., Disp: 42.5 g, Rfl: 12 .  fluticasone (FLONASE) 50 MCG/ACT nasal spray, Place 2 sprays into both nostrils daily as needed for allergies., Disp: 48 g, Rfl: 3 .  GENTEAL TEARS 0.1-0.3 % SOLN, as needed. , Disp: , Rfl:  .  ketotifen (ZADITOR) 0.025 % ophthalmic solution, Place 1 drop into both eyes daily as needed (allergies)., Disp: , Rfl:  .  losartan (COZAAR) 50 MG tablet, Take 1.5 tablets (75 mg total) by mouth daily., Disp: 135 tablet, Rfl: 3 .  Multiple Vitamins-Minerals (PRESERVISION AREDS 2 PO), Take 1 tablet by mouth 2 (two) times daily., Disp: , Rfl:  .  omeprazole (PRILOSEC) 20 MG capsule, Take 1 capsule (20 mg total) by mouth daily., Disp: 90 capsule, Rfl: 3 .  sertraline (ZOLOFT) 100 MG tablet, Take 1 tablet (100 mg total) by mouth daily., Disp: 90 tablet, Rfl: 3 .  ascorbic Acid (VITAMIN C) 500 MG CPCR, Take 500 mg by mouth daily. , Disp: , Rfl:   Patient Care Team: Virginia Crews, MD as PCP - General (Family Medicine) Birder Robson, MD as Referring Physician (Ophthalmology) Sharlet Salina, MD as Referring Physician (Physical Medicine and  Rehabilitation) Margaretha Sheffield, MD (Otolaryngology) Lollie Sails, MD (Inactive) as Consulting Physician (Gastroenterology) Vern Claude, LCSW as [place] Management Lucilla Lame, MD as Consulting Physician (Gastroenterology) Bary Castilla Forest Gleason, MD as Consulting Physician (General Surgery)     Objective:    Vitals: BP 123/85 (BP Location: Left Arm, Patient Position: Sitting, Cuff Size: Large)   Pulse 82   Temp (!) 96.9 F (36.1 C) (Temporal)   Resp 16   Ht 5\' 9"  (1.753 m)   Wt 178 lb 12.8 oz (81.1 kg)   BMI 26.40 kg/m   Physical Exam Vitals reviewed.  Constitutional:      General: She is not in acute distress.    Appearance: Normal appearance. She is well-developed. She is not diaphoretic.  HENT:     Head: Normocephalic and atraumatic.     Right Ear: Tympanic membrane, ear canal and external ear normal.     Left Ear: Tympanic membrane, ear canal and external ear normal.  Eyes:     General: No scleral icterus.    Conjunctiva/sclera: Conjunctivae normal.     Pupils: Pupils are equal, round, and reactive to light.  Neck:     Thyroid: No thyromegaly.  Cardiovascular:     Rate and Rhythm: Normal rate and regular rhythm.     Pulses: Normal pulses.     Heart sounds: Normal heart sounds. No murmur.  Pulmonary:     Effort: Pulmonary effort is normal. No respiratory distress.     Breath sounds: Normal breath sounds. No wheezing or rales.  Abdominal:     General: There is no distension.     Palpations: Abdomen is soft.     Tenderness: There is no abdominal tenderness.  Musculoskeletal:        General: No deformity.     Cervical back: Neck supple.     Right lower leg: No edema.     Left lower leg: No edema.  Lymphadenopathy:     Cervical: No cervical adenopathy.  Skin:    General: Skin is warm and dry.     Findings: No rash.  Neurological:     Mental Status: She is alert and oriented to person, place, and time. Mental status is at baseline.   Psychiatric:        Mood and Affect: Mood normal.        Behavior: Behavior normal.        Thought Content: Thought content normal.     Activities of Daily Living In  your present state of health, do you have any difficulty performing the following activities: 12/26/2019  Hearing? N  Vision? Y  Comment Due to MD in both eyes.  Difficulty concentrating or making decisions? N  Walking or climbing stairs? Y  Comment Due to lower back pain.  Dressing or bathing? N  Doing errands, shopping? N  Preparing Food and eating ? N  Using the Toilet? N  In the past six months, have you accidently leaked urine? N  Comment Has a pessary.  Do you have problems with loss of bowel control? N  Managing your Medications? N  Managing your Finances? N  Housekeeping or managing your Housekeeping? Y  Some recent data might be hidden    Fall Risk Assessment Fall Risk  12/26/2019 05/01/2019 02/02/2019 01/17/2019 12/20/2018  Falls in the past year? 0 1 0 0 0  Number falls in past yr: 0 0 - 0 -  Injury with Fall? 0 0 - - -  Follow up - - - Falls evaluation completed -     Depression Screen PHQ 2/9 Scores 01/23/2020 10/13/2019 07/21/2019 05/01/2019  PHQ - 2 Score 0 1 1 1   PHQ- 9 Score 1 - 4 -    6CIT Screen 12/26/2019  What Year? 0 points  What month? 0 points  What time? 0 points  Count back from 20 0 points  Months in reverse 0 points  Repeat phrase 0 points  Total Score 0       Assessment & Plan:    Annual Physical Reviewed patient's Family Medical History Reviewed and updated list of patient's medical providers Assessment of cognitive impairment was done Assessed patient's functional ability Established a written schedule for health screening Mildred Completed and Reviewed  Exercise Activities and Dietary recommendations Goals    . "I need help managing my stress" (pt-stated)     Current Barriers:  . Acute Mental Health needs related to adjustment to spouse's medical  condition and family conflicts  . Mental Health Concerns  . Suicidal Ideation/Homicidal Ideation: No  Clinical Social Work Goal(s):  Marland Kitchen Over the next 90 days, patient will work with SW bi-weekly by telephone or in person to reduce or manage symptoms related to stress management   Interventions: . Status of patient's stress level discussed as well as possible triggers . Confirmed that patient was doing much better due to son and daughter's surgery being a success as well as the delivery of her great grand daughter  . Discussed current coping strategies used, (crafting, prayer, reaching out to friends)  exploring alternative perspectives . Discussed plan to go the beach for the next month with spouse (11/30/19-12/16/19) . Positive Reinforcement provided in regards to the development of a self care plan including re-arranging medical appointments in-order to extend time at the beach . Discussed plans with patient for ongoing care management follow up and provided patient with direct contact information for care management team if needed in the future . Emotional/Supportive Counseling provided related to family stress experienced allowing her to vent her frustrations and celebrate her sucessess  Patient Self Care Activities:  . Performs ADL's independently . Performs IADL's independently . Ability for insight  Patient Coping Strengths:  . Supportive Relationships . Friends . Church . Hopefulness . Able to Communicate Effectively  Patient Self Care Deficits:  . Difficulty verbalizing plan for self care  Please see past updates related to this goal by clicking on the "Past Updates" button in the selected goal      .  DIET - DECREASE SODA OR JUICE INTAKE     Recommend to cut back to 1 soda a day and increase water intake to 4-6 8 oz glasses a day.     Marland Kitchen DIET - INCREASE WATER INTAKE     Recommend increasing water intake 6-8 glasses of water a day.        Immunization History   Administered Date(s) Administered  . Fluad Quad(high Dose 65+) 07/21/2019  . Influenza, High Dose Seasonal PF 08/25/2017, 08/13/2018  . PFIZER SARS-COV-2 Vaccination 01/08/2020  . Pneumococcal Conjugate-13 05/17/2014  . Pneumococcal Polysaccharide-23 04/11/2012  . Td 04/08/2010  . Tdap 04/11/2012  . Zoster 02/04/2009  . Zoster Recombinat (Shingrix) 06/14/2019, 09/07/2019    Health Maintenance  Topic Date Due  . COLONOSCOPY  12/31/2019  . TETANUS/TDAP  04/11/2022  . DEXA SCAN  04/26/2024  . INFLUENZA VACCINE  Completed  . PNA vac Low Risk Adult  Completed     Discussed health benefits of physical activity, and encouraged her to engage in regular exercise appropriate for her age and condition.    ------------------------------------------------------------------------------------------------------------  Problem List Items Addressed This Visit      Cardiovascular and Mediastinum   Essential (primary) hypertension    Well controlled Continue current medications Recheck metabolic panel F/u in 6 months       Relevant Medications   losartan (COZAAR) 50 MG tablet   Other Relevant Orders   CBC with Differential/Platelet (Completed)   Comprehensive metabolic panel (Completed)   TSH (Completed)     Respiratory   Allergic rhinitis    Previously well controlled Has run out of flonase and so is now having more eustachian tube dysfunction and postnasal drip Will refill flonase        Digestive   Adenomatous polyp of ascending colon    1 yr s/p partial colon resection Upcoming colonoscopy next week        Musculoskeletal and Integument   Osteopenia of multiple sites    Recheck Vit D Repeat DEXA in 2-5 years pending clinical status at that time      Relevant Orders   VITAMIN D 25 Hydroxy (Vit-D Deficiency, Fractures) (Completed)     Other   Clinical depression    Related to ongoing stressful situations Continue Zoloft at current dose Contracted for safety - no  SI/HI      Relevant Medications   ALPRAZolam (XANAX) 0.5 MG tablet   sertraline (ZOLOFT) 100 MG tablet   Anxiety, generalized    Chronic and well controlled Improved with Zoloft - continue at current dose Taking Xanax sparingly - we have discussed risks      Relevant Medications   ALPRAZolam (XANAX) 0.5 MG tablet   sertraline (ZOLOFT) 100 MG tablet   Hypercholesteremia    Tolerating atorvastatin well - continue at current dose Continue CoQ10 to prevent myalgias She is also now taking omega 3s Recheck CMP and FLP      Relevant Medications   losartan (COZAAR) 50 MG tablet   Other Relevant Orders   Lipid Panel With LDL/HDL Ratio (Completed)   Blood glucose elevated    Noted incidentally on previous labs Check A1c      Relevant Orders   TSH (Completed)   Hemoglobin A1c (Completed)   Overweight    Discussed importance of healthy weight management Discussed diet and exercise        Other Visit Diagnoses    Encounter for annual physical exam    -  Primary  Relevant Orders   VITAMIN D 25 Hydroxy (Vit-D Deficiency, Fractures) (Completed)   Situational anxiety       Relevant Medications   ALPRAZolam (XANAX) 0.5 MG tablet   sertraline (ZOLOFT) 100 MG tablet   Encounter for screening mammogram for malignant neoplasm of breast       Relevant Orders   MM 3D SCREEN BREAST BILATERAL       Return in about 6 months (around 07/25/2020) for chronic disease f/u.   The entirety of the information documented in the History of Present Illness, Review of Systems and Physical Exam were personally obtained by me. Portions of this information were initially documented by Lynford Humphrey, CMA and reviewed by me for thoroughness and accuracy.    Rejoice Heatwole, Dionne Bucy, MD MPH [facility]

## 2020-01-24 ENCOUNTER — Encounter: Payer: Self-pay | Admitting: Family Medicine

## 2020-01-24 DIAGNOSIS — M8589 Other specified disorders of bone density and structure, multiple sites: Secondary | ICD-10-CM | POA: Insufficient documentation

## 2020-01-24 DIAGNOSIS — E663 Overweight: Secondary | ICD-10-CM | POA: Insufficient documentation

## 2020-01-24 LAB — LIPID PANEL WITH LDL/HDL RATIO
Cholesterol, Total: 119 mg/dL (ref 100–199)
HDL: 37 mg/dL — ABNORMAL LOW
LDL Chol Calc (NIH): 55 mg/dL (ref 0–99)
LDL/HDL Ratio: 1.5 ratio (ref 0.0–3.2)
Triglycerides: 161 mg/dL — ABNORMAL HIGH (ref 0–149)
VLDL Cholesterol Cal: 27 mg/dL (ref 5–40)

## 2020-01-24 LAB — CBC WITH DIFFERENTIAL/PLATELET
Basophils Absolute: 0 x10E3/uL (ref 0.0–0.2)
Basos: 1 %
EOS (ABSOLUTE): 0.2 x10E3/uL (ref 0.0–0.4)
Eos: 2 %
Hematocrit: 38.7 % (ref 34.0–46.6)
Hemoglobin: 13 g/dL (ref 11.1–15.9)
Immature Grans (Abs): 0 x10E3/uL (ref 0.0–0.1)
Immature Granulocytes: 0 %
Lymphocytes Absolute: 1.5 x10E3/uL (ref 0.7–3.1)
Lymphs: 19 %
MCH: 30 pg (ref 26.6–33.0)
MCHC: 33.6 g/dL (ref 31.5–35.7)
MCV: 89 fL (ref 79–97)
Monocytes Absolute: 0.5 x10E3/uL (ref 0.1–0.9)
Monocytes: 6 %
Neutrophils Absolute: 5.6 x10E3/uL (ref 1.4–7.0)
Neutrophils: 72 %
Platelets: 230 x10E3/uL (ref 150–450)
RBC: 4.34 x10E6/uL (ref 3.77–5.28)
RDW: 13.1 % (ref 11.7–15.4)
WBC: 7.8 x10E3/uL (ref 3.4–10.8)

## 2020-01-24 LAB — COMPREHENSIVE METABOLIC PANEL WITH GFR
ALT: 9 IU/L (ref 0–32)
AST: 14 IU/L (ref 0–40)
Albumin/Globulin Ratio: 2.1 (ref 1.2–2.2)
Albumin: 4.4 g/dL (ref 3.7–4.7)
Alkaline Phosphatase: 80 IU/L (ref 39–117)
BUN/Creatinine Ratio: 11 — ABNORMAL LOW (ref 12–28)
BUN: 10 mg/dL (ref 8–27)
Bilirubin Total: 0.8 mg/dL (ref 0.0–1.2)
CO2: 26 mmol/L (ref 20–29)
Calcium: 9.4 mg/dL (ref 8.7–10.3)
Chloride: 104 mmol/L (ref 96–106)
Creatinine, Ser: 0.91 mg/dL (ref 0.57–1.00)
GFR calc Af Amer: 70 mL/min/1.73
GFR calc non Af Amer: 61 mL/min/1.73
Globulin, Total: 2.1 g/dL (ref 1.5–4.5)
Glucose: 94 mg/dL (ref 65–99)
Potassium: 4.2 mmol/L (ref 3.5–5.2)
Sodium: 142 mmol/L (ref 134–144)
Total Protein: 6.5 g/dL (ref 6.0–8.5)

## 2020-01-24 LAB — VITAMIN D 25 HYDROXY (VIT D DEFICIENCY, FRACTURES): Vit D, 25-Hydroxy: 31 ng/mL (ref 30.0–100.0)

## 2020-01-24 LAB — HEMOGLOBIN A1C
Est. average glucose Bld gHb Est-mCnc: 105 mg/dL
Hgb A1c MFr Bld: 5.3 % (ref 4.8–5.6)

## 2020-01-24 LAB — TSH: TSH: 1.24 u[IU]/mL (ref 0.450–4.500)

## 2020-01-24 NOTE — Assessment & Plan Note (Signed)
1 yr s/p partial colon resection Upcoming colonoscopy next week

## 2020-01-24 NOTE — Assessment & Plan Note (Signed)
Recheck Vit D Repeat DEXA in 2-5 years pending clinical status at that time

## 2020-01-24 NOTE — Assessment & Plan Note (Signed)
Related to ongoing stressful situations Continue Zoloft at current dose Contracted for safety - no SI/HI

## 2020-01-24 NOTE — Assessment & Plan Note (Signed)
Discussed importance of healthy weight management Discussed diet and exercise  

## 2020-01-24 NOTE — Assessment & Plan Note (Signed)
Chronic and well controlled Improved with Zoloft - continue at current dose Taking Xanax sparingly - we have discussed risks

## 2020-01-24 NOTE — Assessment & Plan Note (Signed)
Noted incidentally on previous labs Check A1c

## 2020-01-24 NOTE — Assessment & Plan Note (Signed)
Previously well controlled Has run out of flonase and so is now having more eustachian tube dysfunction and postnasal drip Will refill flonase

## 2020-01-24 NOTE — Assessment & Plan Note (Signed)
Tolerating atorvastatin well - continue at current dose Continue CoQ10 to prevent myalgias She is also now taking omega 3s Recheck CMP and FLP

## 2020-01-26 ENCOUNTER — Other Ambulatory Visit
Admission: RE | Admit: 2020-01-26 | Discharge: 2020-01-26 | Disposition: A | Discharge status: Home / Self Care | Payer: PPO | Source: Ambulatory Visit | Attending: Gastroenterology | Admitting: Gastroenterology

## 2020-01-26 ENCOUNTER — Telehealth: Payer: Self-pay

## 2020-01-26 DIAGNOSIS — Z20822 Contact with and (suspected) exposure to covid-19: Secondary | ICD-10-CM | POA: Diagnosis not present

## 2020-01-26 DIAGNOSIS — Z01812 Encounter for preprocedural laboratory examination: Secondary | ICD-10-CM | POA: Insufficient documentation

## 2020-01-26 LAB — SARS CORONAVIRUS 2 (TAT 6-24 HRS): SARS Coronavirus 2: NEGATIVE

## 2020-01-26 NOTE — Telephone Encounter (Signed)
Pt advised.   Thanks,   -Chandrika Sandles  

## 2020-01-26 NOTE — Telephone Encounter (Signed)
-----   Message from Virginia Crews, MD sent at 01/26/2020 12:42 PM EST ----- Normal labs

## 2020-01-30 ENCOUNTER — Encounter: Payer: Self-pay | Admitting: Gastroenterology

## 2020-01-30 ENCOUNTER — Ambulatory Visit: Payer: PPO | Admitting: Anesthesiology

## 2020-01-30 ENCOUNTER — Other Ambulatory Visit: Payer: Self-pay

## 2020-01-30 ENCOUNTER — Encounter
Admission: RE | Disposition: A | Discharge status: Home / Self Care | Payer: Self-pay | Source: Home / Self Care | Attending: Gastroenterology

## 2020-01-30 ENCOUNTER — Ambulatory Visit
Admission: RE | Admit: 2020-01-30 | Discharge: 2020-01-30 | Disposition: A | Discharge status: Home / Self Care | Payer: PPO | Attending: Gastroenterology | Admitting: Gastroenterology

## 2020-01-30 DIAGNOSIS — Z8711 Personal history of peptic ulcer disease: Secondary | ICD-10-CM | POA: Diagnosis not present

## 2020-01-30 DIAGNOSIS — Z880 Allergy status to penicillin: Secondary | ICD-10-CM | POA: Diagnosis not present

## 2020-01-30 DIAGNOSIS — K635 Polyp of colon: Secondary | ICD-10-CM

## 2020-01-30 DIAGNOSIS — Z1211 Encounter for screening for malignant neoplasm of colon: Secondary | ICD-10-CM | POA: Insufficient documentation

## 2020-01-30 DIAGNOSIS — Z888 Allergy status to other drugs, medicaments and biological substances status: Secondary | ICD-10-CM | POA: Insufficient documentation

## 2020-01-30 DIAGNOSIS — Z9049 Acquired absence of other specified parts of digestive tract: Secondary | ICD-10-CM | POA: Insufficient documentation

## 2020-01-30 DIAGNOSIS — Z90710 Acquired absence of both cervix and uterus: Secondary | ICD-10-CM | POA: Insufficient documentation

## 2020-01-30 DIAGNOSIS — F419 Anxiety disorder, unspecified: Secondary | ICD-10-CM | POA: Diagnosis not present

## 2020-01-30 DIAGNOSIS — Z860100 Personal history of colon polyps, unspecified: Secondary | ICD-10-CM

## 2020-01-30 DIAGNOSIS — K5790 Diverticulosis of intestine, part unspecified, without perforation or abscess without bleeding: Secondary | ICD-10-CM | POA: Diagnosis not present

## 2020-01-30 DIAGNOSIS — Z833 Family history of diabetes mellitus: Secondary | ICD-10-CM | POA: Insufficient documentation

## 2020-01-30 DIAGNOSIS — M1990 Unspecified osteoarthritis, unspecified site: Secondary | ICD-10-CM | POA: Diagnosis not present

## 2020-01-30 DIAGNOSIS — I10 Essential (primary) hypertension: Secondary | ICD-10-CM | POA: Diagnosis not present

## 2020-01-30 DIAGNOSIS — D126 Benign neoplasm of colon, unspecified: Secondary | ICD-10-CM | POA: Diagnosis not present

## 2020-01-30 DIAGNOSIS — Z885 Allergy status to narcotic agent status: Secondary | ICD-10-CM | POA: Diagnosis not present

## 2020-01-30 DIAGNOSIS — Z8249 Family history of ischemic heart disease and other diseases of the circulatory system: Secondary | ICD-10-CM | POA: Diagnosis not present

## 2020-01-30 DIAGNOSIS — E785 Hyperlipidemia, unspecified: Secondary | ICD-10-CM | POA: Diagnosis not present

## 2020-01-30 DIAGNOSIS — Z8262 Family history of osteoporosis: Secondary | ICD-10-CM | POA: Insufficient documentation

## 2020-01-30 DIAGNOSIS — D123 Benign neoplasm of transverse colon: Secondary | ICD-10-CM | POA: Diagnosis not present

## 2020-01-30 DIAGNOSIS — Z7982 Long term (current) use of aspirin: Secondary | ICD-10-CM | POA: Insufficient documentation

## 2020-01-30 DIAGNOSIS — Z841 Family history of disorders of kidney and ureter: Secondary | ICD-10-CM | POA: Insufficient documentation

## 2020-01-30 DIAGNOSIS — Z825 Family history of asthma and other chronic lower respiratory diseases: Secondary | ICD-10-CM | POA: Diagnosis not present

## 2020-01-30 DIAGNOSIS — K219 Gastro-esophageal reflux disease without esophagitis: Secondary | ICD-10-CM | POA: Insufficient documentation

## 2020-01-30 DIAGNOSIS — Z800 Family history of malignant neoplasm of digestive organs: Secondary | ICD-10-CM | POA: Insufficient documentation

## 2020-01-30 DIAGNOSIS — K5730 Diverticulosis of large intestine without perforation or abscess without bleeding: Secondary | ICD-10-CM | POA: Diagnosis not present

## 2020-01-30 DIAGNOSIS — Z791 Long term (current) use of non-steroidal anti-inflammatories (NSAID): Secondary | ICD-10-CM | POA: Diagnosis not present

## 2020-01-30 DIAGNOSIS — Z8051 Family history of malignant neoplasm of kidney: Secondary | ICD-10-CM | POA: Insufficient documentation

## 2020-01-30 DIAGNOSIS — Z809 Family history of malignant neoplasm, unspecified: Secondary | ICD-10-CM | POA: Insufficient documentation

## 2020-01-30 DIAGNOSIS — Z820 Family history of epilepsy and other diseases of the nervous system: Secondary | ICD-10-CM | POA: Insufficient documentation

## 2020-01-30 HISTORY — PX: COLONOSCOPY WITH PROPOFOL: SHX5780

## 2020-01-30 SURGERY — COLONOSCOPY WITH PROPOFOL
Anesthesia: General

## 2020-01-30 MED ORDER — SODIUM CHLORIDE 0.9 % IV SOLN: INTRAVENOUS | Status: DC

## 2020-01-30 MED ORDER — FENTANYL CITRATE (PF) 100 MCG/2ML IJ SOLN
INTRAMUSCULAR | Status: DC | PRN
  Administered 2020-01-30: 50 ug via INTRAVENOUS

## 2020-01-30 MED ORDER — MIDAZOLAM HCL 2 MG/2ML IJ SOLN
INTRAMUSCULAR | Status: DC | PRN
  Administered 2020-01-30 (×2): 1 mg via INTRAVENOUS

## 2020-01-30 MED ORDER — FENTANYL CITRATE (PF) 100 MCG/2ML IJ SOLN
INTRAMUSCULAR | Status: AC
  Filled 2020-01-30: qty 2

## 2020-01-30 MED ORDER — PROPOFOL 500 MG/50ML IV EMUL
INTRAVENOUS | Status: DC | PRN
  Administered 2020-01-30: 100 ug/kg/min via INTRAVENOUS

## 2020-01-30 MED ORDER — PROPOFOL 500 MG/50ML IV EMUL
INTRAVENOUS | Status: AC
  Filled 2020-01-30: qty 50

## 2020-01-30 MED ORDER — MIDAZOLAM HCL 2 MG/2ML IJ SOLN
INTRAMUSCULAR | Status: AC
  Filled 2020-01-30: qty 2

## 2020-01-30 NOTE — Anesthesia Postprocedure Evaluation (Signed)
Anesthesia Post Note  Patient: Rita Cruz  Procedure(s) Performed: COLONOSCOPY WITH PROPOFOL (N/A )  Patient location during evaluation: Endoscopy Anesthesia Type: General Level of consciousness: awake and alert Pain management: pain level controlled Vital Signs Assessment: post-procedure vital signs reviewed and stable Respiratory status: spontaneous breathing, nonlabored ventilation, respiratory function stable and patient connected to nasal cannula oxygen Cardiovascular status: blood pressure returned to baseline and stable Postop Assessment: no apparent nausea or vomiting Anesthetic complications: no     Last Vitals:  Vitals:   01/30/20 0940 01/30/20 0950  BP: (!) 112/52 96/83  Pulse: 73 64  Resp: (!) 24 13  Temp:    SpO2: 97% 98%    Last Pain:  Vitals:   01/30/20 0930  TempSrc: Tympanic  PainSc:                  Arita Miss

## 2020-01-30 NOTE — Op Note (Signed)
[facility] Gastroenterology Patient Name: Rita Cruz Procedure Date: 01/30/2020 8:54 AM MRN: [id number] Account #: [id number] Date of Birth: Jun 24, 1941 Admit Type: Outpatient Age: 79 Room: [facility] ENDO ROOM 4 Gender: Female Note Status: Finalized Procedure:             Colonoscopy Indications:           High risk colon cancer surveillance: Personal history                         of colonic polyps Providers:             Lucilla Lame MD, MD Medicines:             Propofol per Anesthesia Complications:         No immediate complications. Procedure:             Pre-Anesthesia Assessment:                        - Prior to the procedure, a History and Physical was                         performed, and patient medications and allergies were                         reviewed. The patient's tolerance of previous                         anesthesia was also reviewed. The risks and benefits                         of the procedure and the sedation options and risks                         were discussed with the patient. All questions were                         answered, and informed consent was obtained. Prior                         Anticoagulants: The patient has taken no previous                         anticoagulant or antiplatelet agents. ASA Grade                         Assessment: II - A patient with mild systemic disease.                         After reviewing the risks and benefits, the patient                         was deemed in satisfactory condition to undergo the                         procedure.                        After obtaining informed consent, the colonoscope was  passed under direct vision. Throughout the procedure,                         the patient's blood pressure, pulse, and oxygen                         saturations were monitored continuously. The                         Colonoscope was introduced through  the anus and                         advanced to the the ileocolonic anastomosis. The                         colonoscopy was performed without difficulty. The                         patient tolerated the procedure well. The quality of                         the bowel preparation was excellent. Findings:      The perianal and digital rectal examinations were normal.      A 3 mm polyp was found in the transverse colon. The polyp was sessile.       The polyp was removed with a cold biopsy forceps. Resection and       retrieval were complete.      A 3 mm polyp was found in the descending colon. The polyp was sessile.       The polyp was removed with a cold biopsy forceps. Resection and       retrieval were complete.      Many small-mouthed diverticula were found in the sigmoid colon and       descending colon. Impression:            - One 3 mm polyp in the transverse colon, removed with                         a cold biopsy forceps. Resected and retrieved.                        - One 3 mm polyp in the descending colon, removed with                         a cold biopsy forceps. Resected and retrieved.                        - Diverticulosis in the sigmoid colon and in the                         descending colon. Recommendation:        - Discharge patient to home.                        - Resume previous diet.                        - Continue present medications.                        -  Await pathology results.                        - Repeat colonoscopy in 3 years for surveillance. Procedure Code(s):     --- Professional ---                        [phone], Colonoscopy, flexible; with biopsy, single or                         multiple Diagnosis Code(s):     --- Professional ---                        Z86.010, Personal history of colonic polyps                        K63.5, Polyp of colon CPT copyright 2019 American [employer] Association. All rights reserved. The codes documented in  this report are preliminary and upon coder review may  be revised to meet current compliance requirements. Lucilla Lame MD, MD 01/30/2020 9:27:05 AM This report has been signed electronically. Number of Addenda: 0 Note Initiated On: 01/30/2020 8:54 AM Scope Withdrawal Time: 0 hours 4 minutes 30 seconds  Total Procedure Duration: 0 hours 9 minutes 17 seconds  Estimated Blood Loss:  Estimated blood loss: none.      [facility]

## 2020-01-30 NOTE — H&P (Signed)
Lucilla Lame, MD [address] [place], [place] [address] [phone] Fax : [phone]  Primary Care Physician:  Virginia Crews, MD Primary Gastroenterologist:  Dr. Allen Norris  Pre-Procedure History & Physical: HPI:  Rita Cruz is a 79 y.o. female is here for an colonoscopy.   Past Medical History:  Diagnosis Date  . Adenomatous polyps   . Anxiety   . Arthritis    neck  . Bronchitis 02/04/2018  . Capsulitis of toe of right foot   . Chest pain    Adenosine Myoview in 2006 done for atypical chest pain, showed no eviden e for ischemia or infarct  . Colon polyps   . GERD (gastroesophageal reflux disease)   . History of colonic polyps   . HTN (hypertension)   . Hyperlipidemia   . PUD (peptic ulcer disease)    with hx of H. Pylori    Past Surgical History:  Procedure Laterality Date  . ABDOMINAL HYSTERECTOMY    . BREAST BIOPSY Right 2011   benign mass done in Dr. Curly [place] office  . BREAST BIOPSY Right 06/29/2015   top hat marker. PROLIFERATIVE FIBROCYSTIC CHANGE WITH ASSOCIATED   . CERVICAL DISCECTOMY    . CHOLECYSTECTOMY    . COLONOSCOPY WITH PROPOFOL N/A 10/06/2016   Procedure: COLONOSCOPY WITH PROPOFOL;  Surgeon: Lollie Sails, MD;  Location: [facility] ENDOSCOPY;  Service: Endoscopy;  Laterality: N/A;  . COLONOSCOPY WITH PROPOFOL N/A 12/30/2018   Procedure: COLONOSCOPY WITH PROPOFOL;  Surgeon: Lollie Sails, MD;  Location: [facility] ENDOSCOPY;  Service: Endoscopy;  Laterality: N/A;  . EYE SURGERY    . hysterectomy-unspecified area    . LAPAROSCOPIC RIGHT COLECTOMY Right 01/23/2019   Procedure: LAPAROSCOPIC RIGHT COLECTOMY;  Surgeon: Robert Bellow, MD;  Location: [facility] ORS;  Service: General;  Laterality: Right;    Prior to Admission medications   Medication Sig Start Date End Date Taking? Authorizing Provider  aspirin (ASPIRIN EC LO-DOSE) 81 MG EC tablet Take 81 mg by mouth at bedtime.  09/25/10  Yes [provider]    atorvastatin (LIPITOR) 10 MG tablet Take 1 tablet (10 mg total) by mouth daily. 08/28/19  Yes Bacigalupo, Dionne Bucy, MD  losartan (COZAAR) 50 MG tablet Take 1.5 tablets (75 mg total) by mouth daily. 01/23/20  Yes Bacigalupo, Dionne Bucy, MD  omeprazole (PRILOSEC) 20 MG capsule Take 1 capsule (20 mg total) by mouth daily. 01/23/20  Yes Bacigalupo, Dionne Bucy, MD  sertraline (ZOLOFT) 100 MG tablet Take 1 tablet (100 mg total) by mouth daily. 01/23/20  Yes Bacigalupo, Dionne Bucy, MD  acetaminophen (TYLENOL) 325 MG tablet Take 650 mg by mouth every 6 (six) hours as needed for moderate pain.    [provider]  ALPRAZolam Duanne Moron) 0.5 MG tablet Take 1 tablet (0.5 mg total) by mouth every 12 (twelve) hours as needed. 01/23/20   Bacigalupo, Dionne Bucy, MD  ascorbic Acid (VITAMIN C) 500 MG CPCR Take 500 mg by mouth daily.     [provider]  co-enzyme Q-10 30 MG capsule Take by mouth daily. Unsure dose    [provider]  conjugated estrogens (PREMARIN) vaginal cream Place 1 Applicatorful vaginally 2 (two) times a week. 11/30/19   Schuman, Stefanie Libel, MD  fluticasone (FLONASE) 50 MCG/ACT nasal spray Place 2 sprays into both nostrils daily as needed for allergies. 01/23/20   Bacigalupo, Dionne Bucy, MD  GENTEAL TEARS 0.1-0.3 % SOLN as needed.  11/22/19   [provider]  ketotifen (ZADITOR)  0.025 % ophthalmic solution Place 1 drop into both eyes daily as needed (allergies).    [provider]  Multiple Vitamins-Minerals (PRESERVISION AREDS 2 PO) Take 1 tablet by mouth 2 (two) times daily.    [provider]    Allergies as of 01/11/2020 - Review Complete 12/26/2019  Allergen Reaction Noted  . Morphine and related Other (See Comments) 06/07/2019  . Percocet  [oxycodone-acetaminophen] Other (See Comments) 05/23/2015  . Levaquin [levofloxacin in d5w] Diarrhea 07/10/2015  . Lisinopril  10/25/2017  . Meloxicam  06/25/2016  . Penicillins Itching 05/23/2015  . Preservision  areds 2 [multiple vitamins-minerals] Nausea Only 12/07/2017    Family History  Problem Relation Age of Onset  . Kidney failure Father   . Heart failure Father   . Diabetes Father   . COPD Mother   . Colon cancer Mother 61  . Heart attack Mother   . Ulcers Mother   . Osteoporosis Mother   . Macular degeneration Mother   . Hypertension Brother   . Irritable bowel syndrome Daughter   . Colon polyps Daughter   . Kidney Stones Son   . Bipolar disorder Son   . Melanoma Son        stage IV  . Hypertension Son   . Kidney cancer Other   . Breast cancer Neg Hx     Social History   Socioeconomic History  . Marital status: Married    Spouse name: Not on file  . Number of children: 2  . Years of education: Not on file  . Highest education level: Some college, no degree  Occupational History  . Occupation: retired  Tobacco Use  . Smoking status: Never Smoker  . Smokeless tobacco: Never Used  . Tobacco comment: does not smoke  Substance and Sexual Activity  . Alcohol use: Yes    Alcohol/week: 7.0 standard drinks    Types: 7 Glasses of wine per week    Comment: 1 glass of wine nightly  . Drug use: No  . Sexual activity: Not Currently    Birth control/protection: Surgical  Other Topics Concern  . Not on file  Social History Narrative   Married-2nd marriage. She has 2 children and her husband has 2 children.    Social Determinants of Health   Financial Resource Strain: Low Risk   . Difficulty of Paying Living Expenses: Not hard at all  Food Insecurity: No Food Insecurity  . Worried About Charity fundraiser in the Last Year: Never true  . Ran Out of Food in the Last Year: Never true  Transportation Needs: No Transportation Needs  . Lack of Transportation (Medical): No  . Lack of Transportation (Non-Medical): No  Physical Activity: Sufficiently Active  . Days of Exercise per Week: 4 days  . Minutes of Exercise per Session: 150+ min  Stress: No Stress Concern Present  .  Feeling of Stress : Not at all  Social Connections: Not Isolated  . Frequency of Communication with Friends and Family: Three times a week  . Frequency of Social Gatherings with Friends and Family: Three times a week  . Attends Religious Services: 1 to 4 times per year  . Active Member of Clubs or Organizations: Yes  . Attends Archivist Meetings: 1 to 4 times per year  . Marital Status: Married  Human resources officer Violence: Not At Risk  . Fear of Current or Ex-Partner: No  . Emotionally Abused: No  . Physically Abused: No  . Sexually  Abused: No    Review of Systems: See HPI, otherwise negative ROS  Physical Exam: BP 130/87   Pulse 75   Temp 98 F (36.7 C) (Tympanic)   Resp 18   Ht 5\' 9"  (1.753 m)   Wt 77.1 kg   SpO2 96%   BMI 25.10 kg/m  General:   Alert,  pleasant and cooperative in NAD Head:  Normocephalic and atraumatic. Neck:  Supple; no masses or thyromegaly. Lungs:  Clear throughout to auscultation.    Heart:  Regular rate and rhythm. Abdomen:  Soft, nontender and nondistended. Normal bowel sounds, without guarding, and without rebound.   Neurologic:  Alert and  oriented x4;  grossly normal neurologically.  Impression/Plan: Jasmine Awe Dziuba is here for an colonoscopy to be performed for history of colon polyps  Risks, benefits, limitations, and alternatives regarding  colonoscopy have been reviewed with the patient.  Questions have been answered.  All parties agreeable.   Lucilla Lame, MD  01/30/2020, 9:07 AM

## 2020-01-30 NOTE — Anesthesia Preprocedure Evaluation (Signed)
Anesthesia Evaluation  Patient identified by MRN, date of birth, ID band Patient awake    Reviewed: Allergy & Precautions, NPO status , Patient's Chart, lab work & pertinent test results  History of Anesthesia Complications Negative for: history of anesthetic complications  Airway Mallampati: I  TM Distance: >3 FB Neck ROM: Full    Dental no notable dental hx. (+) Teeth Intact   Pulmonary neg pulmonary ROS, neg sleep apnea, neg COPD, Patient abstained from smoking.Not current smoker,    Pulmonary exam normal breath sounds clear to auscultation       Cardiovascular Exercise Tolerance: Good METShypertension, Pt. on medications (-) CAD and (-) Past MI (-) dysrhythmias  Rhythm:Regular Rate:Normal - Systolic murmurs    Neuro/Psych PSYCHIATRIC DISORDERS Anxiety Depression negative neurological ROS     GI/Hepatic PUD, GERD  Medicated,(+)     (-) substance abuse  , S/p colon resection for polyps   Endo/Other  neg diabetes  Renal/GU negative Renal ROS     Musculoskeletal  (+) Arthritis , Osteoarthritis,  S/p cervical fusion   Abdominal   Peds  Hematology   Anesthesia Other Findings Past Medical History: No date: Adenomatous polyps No date: Anxiety No date: Arthritis     Comment:  neck 02/04/2018: Bronchitis No date: Capsulitis of toe of right foot No date: Chest pain     Comment:  Adenosine Myoview in 2006 done for atypical chest pain,               showed no eviden e for ischemia or infarct No date: Colon polyps No date: GERD (gastroesophageal reflux disease) No date: History of colonic polyps No date: HTN (hypertension) No date: Hyperlipidemia No date: PUD (peptic ulcer disease)     Comment:  with hx of H. Pylori  Reproductive/Obstetrics                             Anesthesia Physical Anesthesia Plan  ASA: II  Anesthesia Plan: General   Post-op Pain Management:    Induction:  Intravenous  PONV Risk Score and Plan: 3 and Ondansetron, Propofol infusion and TIVA  Airway Management Planned: Nasal Cannula  Additional Equipment: None  Intra-op Plan:   Post-operative Plan:   Informed Consent: I have reviewed the patients History and Physical, chart, labs and discussed the procedure including the risks, benefits and alternatives for the proposed anesthesia with the patient or authorized representative who has indicated his/her understanding and acceptance.     Dental advisory given  Plan Discussed with: CRNA and Surgeon  Anesthesia Plan Comments: (Discussed risks of anesthesia with patient, including possibility of difficulty with spontaneous ventilation under anesthesia necessitating airway intervention, PONV, and rare risks such as cardiac or respiratory or neurological events. Patient understands.)        Anesthesia Quick Evaluation

## 2020-01-30 NOTE — Transfer of Care (Signed)
Immediate Anesthesia Transfer of Care Note  Patient: [name]  Procedure(s) Performed: COLONOSCOPY WITH PROPOFOL (N/A )  Patient Location: PACU  Anesthesia Type:General  Level of Consciousness: awake  Airway & Oxygen Therapy: Patient Spontanous Breathing and Patient connected to nasal cannula oxygen  Post-op Assessment: Report given to RN and Post -op Vital signs reviewed and stable  Post vital signs: Reviewed and stable  Last Vitals:  Vitals Value Taken Time  BP 83/50 01/30/20 0932  Temp    Pulse 58 01/30/20 0932  Resp 15 01/30/20 0932  SpO2 93 % 01/30/20 0932  Vitals shown include unvalidated device data.  Last Pain:  Vitals:   01/30/20 0841  TempSrc: Tympanic  PainSc: 0-No pain         Complications: No apparent anesthesia complications

## 2020-01-31 ENCOUNTER — Encounter: Payer: Self-pay | Admitting: *Deleted

## 2020-01-31 LAB — SURGICAL PATHOLOGY

## 2020-02-01 ENCOUNTER — Encounter: Payer: Self-pay | Admitting: Gastroenterology

## 2020-02-06 ENCOUNTER — Ambulatory Visit: Payer: PPO | Attending: Internal Medicine

## 2020-02-06 DIAGNOSIS — Z23 Encounter for immunization: Secondary | ICD-10-CM

## 2020-02-06 NOTE — Progress Notes (Signed)
   Covid-19 Vaccination Clinic  Name:  Rita Cruz    MRN: [id number] DOB: [birth year]  02/06/2020  [name] was observed post Covid-19 immunization for 15 minutes without incident. She was provided with Vaccine Information Sheet and instruction to access the V-Safe system.   [name] was instructed to call 911 with any severe reactions post vaccine: Marland Kitchen Difficulty breathing  . Swelling of face and throat  . A fast heartbeat  . A bad rash all over body  . Dizziness and weakness   Immunizations Administered    Name Date Dose VIS Date Route   Pfizer COVID-19 Vaccine 02/06/2020 11:03 AM 0.3 mL 11/03/2019 Intramuscular   Manufacturer: [employer]   Lot: [id number]   [place]: [id number]

## 2020-02-15 DIAGNOSIS — M2560 Stiffness of unspecified joint, not elsewhere classified: Secondary | ICD-10-CM | POA: Diagnosis not present

## 2020-02-15 DIAGNOSIS — M545 Low back pain: Secondary | ICD-10-CM | POA: Diagnosis not present

## 2020-02-15 DIAGNOSIS — M9903 Segmental and somatic dysfunction of lumbar region: Secondary | ICD-10-CM | POA: Diagnosis not present

## 2020-02-15 DIAGNOSIS — R293 Abnormal posture: Secondary | ICD-10-CM | POA: Diagnosis not present

## 2020-02-20 DIAGNOSIS — M9903 Segmental and somatic dysfunction of lumbar region: Secondary | ICD-10-CM | POA: Diagnosis not present

## 2020-02-20 DIAGNOSIS — M545 Low back pain: Secondary | ICD-10-CM | POA: Diagnosis not present

## 2020-02-20 DIAGNOSIS — R293 Abnormal posture: Secondary | ICD-10-CM | POA: Diagnosis not present

## 2020-02-20 DIAGNOSIS — M2560 Stiffness of unspecified joint, not elsewhere classified: Secondary | ICD-10-CM | POA: Diagnosis not present

## 2020-02-22 DIAGNOSIS — M545 Low back pain: Secondary | ICD-10-CM | POA: Diagnosis not present

## 2020-02-22 DIAGNOSIS — M2560 Stiffness of unspecified joint, not elsewhere classified: Secondary | ICD-10-CM | POA: Diagnosis not present

## 2020-02-22 DIAGNOSIS — R293 Abnormal posture: Secondary | ICD-10-CM | POA: Diagnosis not present

## 2020-02-22 DIAGNOSIS — M9903 Segmental and somatic dysfunction of lumbar region: Secondary | ICD-10-CM | POA: Diagnosis not present

## 2020-02-27 DIAGNOSIS — M2560 Stiffness of unspecified joint, not elsewhere classified: Secondary | ICD-10-CM | POA: Diagnosis not present

## 2020-02-27 DIAGNOSIS — M9903 Segmental and somatic dysfunction of lumbar region: Secondary | ICD-10-CM | POA: Diagnosis not present

## 2020-02-27 DIAGNOSIS — M545 Low back pain: Secondary | ICD-10-CM | POA: Diagnosis not present

## 2020-02-27 DIAGNOSIS — R293 Abnormal posture: Secondary | ICD-10-CM | POA: Diagnosis not present

## 2020-02-29 DIAGNOSIS — M545 Low back pain: Secondary | ICD-10-CM | POA: Diagnosis not present

## 2020-02-29 DIAGNOSIS — R293 Abnormal posture: Secondary | ICD-10-CM | POA: Diagnosis not present

## 2020-02-29 DIAGNOSIS — M9903 Segmental and somatic dysfunction of lumbar region: Secondary | ICD-10-CM | POA: Diagnosis not present

## 2020-02-29 DIAGNOSIS — M2560 Stiffness of unspecified joint, not elsewhere classified: Secondary | ICD-10-CM | POA: Diagnosis not present

## 2020-03-05 DIAGNOSIS — M2560 Stiffness of unspecified joint, not elsewhere classified: Secondary | ICD-10-CM | POA: Diagnosis not present

## 2020-03-05 DIAGNOSIS — R293 Abnormal posture: Secondary | ICD-10-CM | POA: Diagnosis not present

## 2020-03-05 DIAGNOSIS — M9903 Segmental and somatic dysfunction of lumbar region: Secondary | ICD-10-CM | POA: Diagnosis not present

## 2020-03-05 DIAGNOSIS — M545 Low back pain: Secondary | ICD-10-CM | POA: Diagnosis not present

## 2020-03-07 ENCOUNTER — Other Ambulatory Visit: Payer: Self-pay | Admitting: Family Medicine

## 2020-03-07 DIAGNOSIS — M9903 Segmental and somatic dysfunction of lumbar region: Secondary | ICD-10-CM | POA: Diagnosis not present

## 2020-03-07 DIAGNOSIS — R293 Abnormal posture: Secondary | ICD-10-CM | POA: Diagnosis not present

## 2020-03-07 DIAGNOSIS — M545 Low back pain: Secondary | ICD-10-CM | POA: Diagnosis not present

## 2020-03-07 DIAGNOSIS — M2560 Stiffness of unspecified joint, not elsewhere classified: Secondary | ICD-10-CM | POA: Diagnosis not present

## 2020-03-07 DIAGNOSIS — F418 Other specified anxiety disorders: Secondary | ICD-10-CM

## 2020-03-07 MED ORDER — ALPRAZOLAM 0.5 MG PO TABS: 0.5000 mg | ORAL_TABLET | Freq: Two times a day (BID) | ORAL | 0 refills | Status: DC | PRN

## 2020-03-07 NOTE — Telephone Encounter (Signed)
LOV  11/27/19  Last refill 01/23/20 patient on vacation and has requested Rx at a different pharmacy (same chain)

## 2020-03-07 NOTE — Telephone Encounter (Signed)
Requested medication (s) are due for refill today - looks like patient has RF- but she has requested pharmacy change  Requested medication (s) are on the active medication list - yes  Future visit scheduled -yes  Last refill: 01/23/20 5RF  Notes to clinic: Request RF non delegated Rx- change in pharmacy  Requested Prescriptions  Pending Prescriptions Disp Refills   ALPRAZolam (XANAX) 0.5 MG tablet 60 tablet 5    Sig: Take 1 tablet (0.5 mg total) by mouth every 12 (twelve) hours as needed.      Not Delegated - Psychiatry:  Anxiolytics/Hypnotics Failed - 03/07/2020 12:54 PM      Failed - This refill cannot be delegated      Failed - Urine Drug Screen completed in last 360 days.      Passed - Valid encounter within last 6 months    Recent Outpatient Visits           1 month ago Encounter for annual physical exam   [facility], Dionne Bucy, MD   7 months ago Need for influenza vaccination   [facility], Dionne Bucy, MD   9 months ago Essential (primary) hypertension   [facility], Dionne Bucy, MD   10 months ago Transient alteration of awareness   [facility] [place], [place]   10 months ago Confusion   [place], [place], [place]       Future Appointments             In 4 months Bacigalupo, Dionne Bucy, MD [facility]                Requested Prescriptions  Pending Prescriptions Disp Refills   ALPRAZolam (XANAX) 0.5 MG tablet 60 tablet 5    Sig: Take 1 tablet (0.5 mg total) by mouth every 12 (twelve) hours as needed.      Not Delegated - Psychiatry:  Anxiolytics/Hypnotics Failed - 03/07/2020 12:54 PM      Failed - This refill cannot be delegated      Failed - Urine Drug Screen completed in last 360 days.      Passed - Valid encounter within last 6 months    Recent Outpatient Visits           1 month ago Encounter for annual  physical exam   [facility], Dionne Bucy, MD   7 months ago Need for influenza vaccination   [facility], Dionne Bucy, MD   9 months ago Essential (primary) hypertension   [facility], Dionne Bucy, MD   10 months ago Transient alteration of awareness   [facility] [place], [place]   10 months ago [place], Wendee Bea[place], [place]       Future Appointments             In 4 months Bacigalupo, Dionne Bucy, MD [facility], [place]

## 2020-03-07 NOTE — Telephone Encounter (Signed)
Medication Refill - Medication: alprazolam   Has the patient contacted their pharmacy? Yes.   Pt called stating that she only as 2 pills left. Please advise.  (Agent: If no, request that the patient contact the pharmacy for the refill.) (Agent: If yes, when and what did the pharmacy advise?)  Preferred Pharmacy (with phone number or street name): [place], [place], [place] Agent: Please be advised that RX refills may take up to 3 business days. We ask that you follow-up with your pharmacy.

## 2020-03-12 DIAGNOSIS — R293 Abnormal posture: Secondary | ICD-10-CM | POA: Diagnosis not present

## 2020-03-12 DIAGNOSIS — M545 Low back pain: Secondary | ICD-10-CM | POA: Diagnosis not present

## 2020-03-12 DIAGNOSIS — M9903 Segmental and somatic dysfunction of lumbar region: Secondary | ICD-10-CM | POA: Diagnosis not present

## 2020-03-12 DIAGNOSIS — M2560 Stiffness of unspecified joint, not elsewhere classified: Secondary | ICD-10-CM | POA: Diagnosis not present

## 2020-03-18 DIAGNOSIS — R293 Abnormal posture: Secondary | ICD-10-CM | POA: Diagnosis not present

## 2020-03-18 DIAGNOSIS — M545 Low back pain: Secondary | ICD-10-CM | POA: Diagnosis not present

## 2020-03-18 DIAGNOSIS — M2560 Stiffness of unspecified joint, not elsewhere classified: Secondary | ICD-10-CM | POA: Diagnosis not present

## 2020-03-18 DIAGNOSIS — M9903 Segmental and somatic dysfunction of lumbar region: Secondary | ICD-10-CM | POA: Diagnosis not present

## 2020-03-19 DIAGNOSIS — M2560 Stiffness of unspecified joint, not elsewhere classified: Secondary | ICD-10-CM | POA: Diagnosis not present

## 2020-03-19 DIAGNOSIS — R293 Abnormal posture: Secondary | ICD-10-CM | POA: Diagnosis not present

## 2020-03-19 DIAGNOSIS — M545 Low back pain: Secondary | ICD-10-CM | POA: Diagnosis not present

## 2020-03-19 DIAGNOSIS — M9903 Segmental and somatic dysfunction of lumbar region: Secondary | ICD-10-CM | POA: Diagnosis not present

## 2020-03-21 DIAGNOSIS — R293 Abnormal posture: Secondary | ICD-10-CM | POA: Diagnosis not present

## 2020-03-21 DIAGNOSIS — M545 Low back pain: Secondary | ICD-10-CM | POA: Diagnosis not present

## 2020-03-21 DIAGNOSIS — M9903 Segmental and somatic dysfunction of lumbar region: Secondary | ICD-10-CM | POA: Diagnosis not present

## 2020-03-21 DIAGNOSIS — M2560 Stiffness of unspecified joint, not elsewhere classified: Secondary | ICD-10-CM | POA: Diagnosis not present

## 2020-03-26 DIAGNOSIS — M2560 Stiffness of unspecified joint, not elsewhere classified: Secondary | ICD-10-CM | POA: Diagnosis not present

## 2020-03-26 DIAGNOSIS — R293 Abnormal posture: Secondary | ICD-10-CM | POA: Diagnosis not present

## 2020-03-26 DIAGNOSIS — M9903 Segmental and somatic dysfunction of lumbar region: Secondary | ICD-10-CM | POA: Diagnosis not present

## 2020-03-26 DIAGNOSIS — M545 Low back pain: Secondary | ICD-10-CM | POA: Diagnosis not present

## 2020-04-03 DIAGNOSIS — L578 Other skin changes due to chronic exposure to nonionizing radiation: Secondary | ICD-10-CM | POA: Diagnosis not present

## 2020-04-03 DIAGNOSIS — D1801 Hemangioma of skin and subcutaneous tissue: Secondary | ICD-10-CM | POA: Diagnosis not present

## 2020-04-03 DIAGNOSIS — L720 Epidermal cyst: Secondary | ICD-10-CM | POA: Diagnosis not present

## 2020-04-03 DIAGNOSIS — Z86018 Personal history of other benign neoplasm: Secondary | ICD-10-CM | POA: Diagnosis not present

## 2020-04-03 DIAGNOSIS — Z872 Personal history of diseases of the skin and subcutaneous tissue: Secondary | ICD-10-CM | POA: Diagnosis not present

## 2020-04-05 ENCOUNTER — Other Ambulatory Visit: Payer: Self-pay

## 2020-04-05 ENCOUNTER — Other Ambulatory Visit: Payer: Self-pay | Admitting: Family Medicine

## 2020-04-05 ENCOUNTER — Ambulatory Visit (INDEPENDENT_AMBULATORY_CARE_PROVIDER_SITE_OTHER): Payer: PPO | Admitting: Obstetrics and Gynecology

## 2020-04-05 ENCOUNTER — Encounter: Payer: Self-pay | Admitting: Obstetrics and Gynecology

## 2020-04-05 VITALS — BP 140/70 | Ht 69.0 in | Wt 181.0 lb

## 2020-04-05 DIAGNOSIS — F418 Other specified anxiety disorders: Secondary | ICD-10-CM

## 2020-04-05 DIAGNOSIS — R350 Frequency of micturition: Secondary | ICD-10-CM | POA: Diagnosis not present

## 2020-04-05 MED ORDER — OXYBUTYNIN CHLORIDE ER 5 MG PO TB24: 5.0000 mg | ORAL_TABLET | Freq: Every day | ORAL | 6 refills | Status: DC

## 2020-04-05 NOTE — Telephone Encounter (Signed)
Requested medication (s) are due for refill today:yes  Requested medication (s) are on the active medication list: yes  Last refill:  02/03/2020  Future visit scheduled: yes  Notes to clinic: this refill cannot be delegated   Requested Prescriptions  Pending Prescriptions Disp Refills   ALPRAZolam (XANAX) 0.5 MG tablet [Pharmacy Med Name: ALPRAZolam 0.5 MG Oral Tablet] 60 tablet 0    Sig: TAKE 1 TABLET BY MOUTH EVERY 12 HOURS AS NEEDED      Not Delegated - Psychiatry:  Anxiolytics/Hypnotics Failed - 04/05/2020 10:37 AM      Failed - This refill cannot be delegated      Failed - Urine Drug Screen completed in last 360 days.      Passed - Valid encounter within last 6 months    Recent Outpatient Visits           2 months ago Encounter for annual physical exam   [facility], Dionne Bucy, MD   8 months ago Need for influenza vaccination   [facility], Dionne Bucy, MD   10 months ago Essential (primary) hypertension   [facility], Dionne Bucy, MD   11 months ago Transient alteration of awareness   [facility] [place], [place]   11 months ago [place], Wendee Beavers, [place]       Future Appointments             In 3 months Bacigalupo, Dionne Bucy, MD [facility], [place]

## 2020-04-05 NOTE — Progress Notes (Signed)
HPI:      [name] is a 79 y.o. 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  Pt reports she would like to have the pessary removed. It is not helping her symptoms of urinary frequency, incontinence, and urgency.    PMHx: She  has a past medical history of Adenomatous polyps, Anxiety, Arthritis, Bronchitis (02/04/2018), Capsulitis of toe of right foot, Chest pain, Colon polyps, GERD (gastroesophageal reflux disease), History of colonic polyps, HTN (hypertension), Hyperlipidemia, and PUD (peptic ulcer disease). Also,  has a past surgical history that includes Cholecystectomy; hysterectomy-unspecified area; Cervical discectomy; Abdominal hysterectomy; Eye surgery; Colonoscopy with propofol (N/A, 10/06/2016); Colonoscopy with propofol (N/A, 12/30/2018); Laparoscopic right colectomy (Right, 01/23/2019); Breast biopsy (Right, 2011); Breast biopsy (Right, 06/29/2015); and Colonoscopy with propofol (N/A, 01/30/2020)., family history includes Bipolar disorder in her son; COPD in her mother; Colon cancer (age of onset: 24) in her mother; Colon polyps in her daughter; Diabetes in her father; Heart attack in her mother; Heart failure in her father; Hypertension in her brother and son; Irritable bowel syndrome in her daughter; Kidney Stones in her son; Kidney cancer in an other family member; Kidney failure in her father; Macular degeneration in her mother; Melanoma in her son; Osteoporosis in her mother; Ulcers in her mother.,  reports that she has never smoked. She has never used smokeless tobacco. She reports current alcohol use of about 7.0 standard drinks of alcohol per week. She reports that she does not use drugs.  She has a current medication list which includes the following prescription(s): acetaminophen, alprazolam, ascorbic acid, aspirin, atorvastatin, co-enzyme q-10, premarin, fluticasone, genteal tears, ketotifen, losartan, multiple  vitamins-minerals, omeprazole, sertraline, and oxybutynin. Also, is allergic to morphine and related; percocet  [oxycodone-acetaminophen]; levaquin [levofloxacin in d5w]; lisinopril; meloxicam; penicillins; and preservision areds 2 [multiple vitamins-minerals].  Review of Systems  Constitutional: Negative for chills, fever, malaise/fatigue and weight loss.  HENT: Negative for congestion, hearing loss and sinus pain.   Eyes: Negative for blurred vision and double vision.  Respiratory: Negative for cough, sputum production, shortness of breath and wheezing.   Cardiovascular: Negative for chest pain, palpitations, orthopnea and leg swelling.  Gastrointestinal: Negative for abdominal pain, constipation, diarrhea, nausea and vomiting.  Genitourinary: Negative for dysuria, flank pain, frequency, hematuria and urgency.  Musculoskeletal: Negative for back pain, falls and joint pain.  Skin: Negative for itching and rash.  Neurological: Negative for dizziness and headaches.  Psychiatric/Behavioral: Negative for depression, substance abuse and suicidal ideas. The patient is not nervous/anxious.     Objective: BP 140/70   Ht 5\' 9"  (1.753 m)   Wt 181 lb (82.1 kg)   BMI 26.73 kg/m  Physical Exam Constitutional:      Appearance: She is well-developed.  Genitourinary:     Vagina and uterus normal.     No lesions in the vagina.     No cervical motion tenderness.     No right or left adnexal mass present.  HENT:     Head: Normocephalic and atraumatic.  Neck:     Thyroid: No thyromegaly.  Cardiovascular:     Rate and Rhythm: Normal rate and regular rhythm.     Heart sounds: Normal heart sounds.  Pulmonary:     Effort: Pulmonary effort is normal.     Breath sounds: Normal breath sounds.  Chest:     Breasts:        Right: No inverted nipple, mass, nipple  discharge or skin change.        Left: No inverted nipple, mass, nipple discharge or skin change.  Abdominal:     General: Bowel sounds are  normal. There is no distension.     Palpations: Abdomen is soft. There is no mass.  Musculoskeletal:     Cervical back: Neck supple.  Neurological:     Mental Status: She is alert and oriented to person, place, and time.  Skin:    General: Skin is warm and dry.  Psychiatric:        Behavior: Behavior normal.        Thought Content: Thought content normal.        Judgment: Judgment normal.  Vitals reviewed.    Pessary Care Pessary removed and cleaned.  Vagina checked - without erosions.  A/P:  Pessary given to patient Will trial Ditropan-XL with patient to see if this help improve her urinary urgency issues.   A total of 20 minutes were spent face-to-face with the patient as well as preparation, review, communication, and documentation during this encounter.   Adrian Prows MD [facility], [facility] 04/05/2020 12:37 PM

## 2020-04-05 NOTE — Patient Instructions (Signed)
Urinary Frequency, Adult Urinary frequency means urinating more often than usual. You may urinate every 1-2 hours even though you drink a normal amount of fluid and do not have a bladder infection or condition. Although you urinate more often than normal, the total amount of urine produced in a day is normal. With urinary frequency, you may have an urgent need to urinate often. The stress and anxiety of needing to find a bathroom quickly can make this urge worse. This condition may go away on its own or you may need treatment at home. Home treatment may include bladder training, exercises, taking medicines, or making changes to your diet. Follow these instructions at home: Bladder health   Keep a bladder diary if told by your health care provider. Keep track of: ? What you eat and drink. ? How often you urinate. ? How much you urinate.  Follow a bladder training program if told by your health care provider. This may include: ? Learning to delay going to the bathroom. ? Double urinating (voiding). This helps if you are not completely emptying your bladder. ? Scheduled voiding.  Do Kegel exercises as told by your health care provider. Kegel exercises strengthen the muscles that help control urination, which may help the condition. Eating and drinking  If told by your health care provider, make diet changes, such as: ? Avoiding caffeine. ? Drinking fewer fluids, especially alcohol. ? Not drinking in the evening. ? Avoiding foods or drinks that may irritate the bladder. These include coffee, tea, soda, artificial sweeteners, citrus, tomato-based foods, and chocolate. ? Eating foods that help prevent or ease constipation. Constipation can make this condition worse. Your health care provider may recommend that you:  Drink enough fluid to keep your urine pale yellow.  Take over-the-counter or prescription medicines.  Eat foods that are high in fiber, such as beans, whole grains, and fresh  fruits and vegetables.  Limit foods that are high in fat and processed sugars, such as fried or sweet foods. General instructions  Take over-the-counter and prescription medicines only as told by your health care provider.  Keep all follow-up visits as told by your health care provider. This is important. Contact a health care provider if:  You start urinating more often.  You feel pain or irritation when you urinate.  You notice blood in your urine.  Your urine looks cloudy.  You develop a fever.  You begin vomiting. Get help right away if:  You are unable to urinate. Summary  Urinary frequency means urinating more often than usual. With urinary frequency, you may urinate every 1-2 hours even though you drink a normal amount of fluid and do not have a bladder infection or other bladder condition.  Your health care provider may recommend that you keep a bladder diary, follow a bladder training program, or make dietary changes.  If told by your health care provider, do Kegel exercises to strengthen the muscles that help control urination.  Take over-the-counter and prescription medicines only as told by your health care provider.  Contact a health care provider if your symptoms do not improve or get worse. This information is not intended to replace advice given to you by your health care provider. Make sure you discuss any questions you have with your health care provider. Document Revised: 05/19/2018 Document Reviewed: 05/19/2018 Elsevier Patient Education  2020 [employer]  

## 2020-05-09 DIAGNOSIS — S42341A Displaced spiral fracture of shaft of humerus, right arm, initial encounter for closed fracture: Secondary | ICD-10-CM | POA: Diagnosis not present

## 2020-05-09 DIAGNOSIS — S42201A Unspecified fracture of upper end of right humerus, initial encounter for closed fracture: Secondary | ICD-10-CM | POA: Diagnosis not present

## 2020-05-09 DIAGNOSIS — M25521 Pain in right elbow: Secondary | ICD-10-CM | POA: Diagnosis not present

## 2020-05-09 DIAGNOSIS — M6281 Muscle weakness (generalized): Secondary | ICD-10-CM | POA: Diagnosis not present

## 2020-05-09 DIAGNOSIS — S59901A Unspecified injury of right elbow, initial encounter: Secondary | ICD-10-CM | POA: Diagnosis not present

## 2020-05-09 DIAGNOSIS — S59911A Unspecified injury of right forearm, initial encounter: Secondary | ICD-10-CM | POA: Diagnosis not present

## 2020-05-09 DIAGNOSIS — M79603 Pain in arm, unspecified: Secondary | ICD-10-CM | POA: Diagnosis not present

## 2020-05-09 DIAGNOSIS — M79631 Pain in right forearm: Secondary | ICD-10-CM | POA: Diagnosis not present

## 2020-05-09 DIAGNOSIS — I10 Essential (primary) hypertension: Secondary | ICD-10-CM | POA: Diagnosis not present

## 2020-05-13 ENCOUNTER — Other Ambulatory Visit: Payer: Self-pay | Admitting: Orthopedic Surgery

## 2020-05-13 ENCOUNTER — Ambulatory Visit
Admission: RE | Admit: 2020-05-13 | Discharge: 2020-05-13 | Disposition: A | Discharge status: Home / Self Care | Payer: PPO | Source: Ambulatory Visit | Attending: Orthopedic Surgery | Admitting: Orthopedic Surgery

## 2020-05-13 ENCOUNTER — Other Ambulatory Visit: Payer: Self-pay

## 2020-05-13 ENCOUNTER — Ambulatory Visit: Payer: PPO

## 2020-05-13 DIAGNOSIS — S42341A Displaced spiral fracture of shaft of humerus, right arm, initial encounter for closed fracture: Secondary | ICD-10-CM | POA: Diagnosis not present

## 2020-05-13 DIAGNOSIS — S42291A Other displaced fracture of upper end of right humerus, initial encounter for closed fracture: Secondary | ICD-10-CM

## 2020-05-14 DIAGNOSIS — S42291D Other displaced fracture of upper end of right humerus, subsequent encounter for fracture with routine healing: Secondary | ICD-10-CM | POA: Diagnosis not present

## 2020-05-16 DIAGNOSIS — S61012A Laceration without foreign body of left thumb without damage to nail, initial encounter: Secondary | ICD-10-CM | POA: Diagnosis not present

## 2020-05-16 DIAGNOSIS — Z01818 Encounter for other preprocedural examination: Secondary | ICD-10-CM | POA: Diagnosis not present

## 2020-05-21 DIAGNOSIS — F329 Major depressive disorder, single episode, unspecified: Secondary | ICD-10-CM | POA: Diagnosis not present

## 2020-05-21 DIAGNOSIS — S42291A Other displaced fracture of upper end of right humerus, initial encounter for closed fracture: Secondary | ICD-10-CM | POA: Diagnosis not present

## 2020-05-21 DIAGNOSIS — G8918 Other acute postprocedural pain: Secondary | ICD-10-CM | POA: Diagnosis not present

## 2020-05-21 DIAGNOSIS — E785 Hyperlipidemia, unspecified: Secondary | ICD-10-CM | POA: Diagnosis not present

## 2020-05-21 DIAGNOSIS — F419 Anxiety disorder, unspecified: Secondary | ICD-10-CM | POA: Diagnosis not present

## 2020-05-21 DIAGNOSIS — Z880 Allergy status to penicillin: Secondary | ICD-10-CM | POA: Diagnosis not present

## 2020-05-21 DIAGNOSIS — Z885 Allergy status to narcotic agent status: Secondary | ICD-10-CM | POA: Diagnosis not present

## 2020-05-21 DIAGNOSIS — K5792 Diverticulitis of intestine, part unspecified, without perforation or abscess without bleeding: Secondary | ICD-10-CM | POA: Diagnosis not present

## 2020-05-21 DIAGNOSIS — K219 Gastro-esophageal reflux disease without esophagitis: Secondary | ICD-10-CM | POA: Diagnosis not present

## 2020-05-21 DIAGNOSIS — I10 Essential (primary) hypertension: Secondary | ICD-10-CM | POA: Diagnosis not present

## 2020-05-21 DIAGNOSIS — M79621 Pain in right upper arm: Secondary | ICD-10-CM | POA: Diagnosis not present

## 2020-05-21 DIAGNOSIS — H579 Unspecified disorder of eye and adnexa: Secondary | ICD-10-CM | POA: Diagnosis not present

## 2020-05-21 HISTORY — PX: FRACTURE SURGERY: SHX138

## 2020-06-05 DIAGNOSIS — M25621 Stiffness of right elbow, not elsewhere classified: Secondary | ICD-10-CM | POA: Diagnosis not present

## 2020-06-05 DIAGNOSIS — S42291A Other displaced fracture of upper end of right humerus, initial encounter for closed fracture: Secondary | ICD-10-CM | POA: Diagnosis not present

## 2020-06-05 DIAGNOSIS — S49091D Other physeal fracture of upper end of humerus, right arm, subsequent encounter for fracture with routine healing: Secondary | ICD-10-CM | POA: Diagnosis not present

## 2020-06-11 DIAGNOSIS — M25621 Stiffness of right elbow, not elsewhere classified: Secondary | ICD-10-CM | POA: Diagnosis not present

## 2020-06-11 DIAGNOSIS — S49091D Other physeal fracture of upper end of humerus, right arm, subsequent encounter for fracture with routine healing: Secondary | ICD-10-CM | POA: Diagnosis not present

## 2020-06-14 DIAGNOSIS — S49091D Other physeal fracture of upper end of humerus, right arm, subsequent encounter for fracture with routine healing: Secondary | ICD-10-CM | POA: Diagnosis not present

## 2020-06-14 DIAGNOSIS — M25621 Stiffness of right elbow, not elsewhere classified: Secondary | ICD-10-CM | POA: Diagnosis not present

## 2020-06-19 DIAGNOSIS — Z09 Encounter for follow-up examination after completed treatment for conditions other than malignant neoplasm: Secondary | ICD-10-CM | POA: Diagnosis not present

## 2020-06-19 DIAGNOSIS — G5601 Carpal tunnel syndrome, right upper limb: Secondary | ICD-10-CM | POA: Diagnosis not present

## 2020-06-19 DIAGNOSIS — S49091D Other physeal fracture of upper end of humerus, right arm, subsequent encounter for fracture with routine healing: Secondary | ICD-10-CM | POA: Diagnosis not present

## 2020-06-20 DIAGNOSIS — S49091D Other physeal fracture of upper end of humerus, right arm, subsequent encounter for fracture with routine healing: Secondary | ICD-10-CM | POA: Diagnosis not present

## 2020-06-20 DIAGNOSIS — M25621 Stiffness of right elbow, not elsewhere classified: Secondary | ICD-10-CM | POA: Diagnosis not present

## 2020-06-25 DIAGNOSIS — M25621 Stiffness of right elbow, not elsewhere classified: Secondary | ICD-10-CM | POA: Diagnosis not present

## 2020-06-25 DIAGNOSIS — S49091D Other physeal fracture of upper end of humerus, right arm, subsequent encounter for fracture with routine healing: Secondary | ICD-10-CM | POA: Diagnosis not present

## 2020-06-27 DIAGNOSIS — S49091D Other physeal fracture of upper end of humerus, right arm, subsequent encounter for fracture with routine healing: Secondary | ICD-10-CM | POA: Diagnosis not present

## 2020-06-27 DIAGNOSIS — M25621 Stiffness of right elbow, not elsewhere classified: Secondary | ICD-10-CM | POA: Diagnosis not present

## 2020-07-04 DIAGNOSIS — M25621 Stiffness of right elbow, not elsewhere classified: Secondary | ICD-10-CM | POA: Diagnosis not present

## 2020-07-04 DIAGNOSIS — S49091D Other physeal fracture of upper end of humerus, right arm, subsequent encounter for fracture with routine healing: Secondary | ICD-10-CM | POA: Diagnosis not present

## 2020-07-11 DIAGNOSIS — M25621 Stiffness of right elbow, not elsewhere classified: Secondary | ICD-10-CM | POA: Diagnosis not present

## 2020-07-11 DIAGNOSIS — S49091D Other physeal fracture of upper end of humerus, right arm, subsequent encounter for fracture with routine healing: Secondary | ICD-10-CM | POA: Diagnosis not present

## 2020-07-15 DIAGNOSIS — M25621 Stiffness of right elbow, not elsewhere classified: Secondary | ICD-10-CM | POA: Diagnosis not present

## 2020-07-15 DIAGNOSIS — S49091D Other physeal fracture of upper end of humerus, right arm, subsequent encounter for fracture with routine healing: Secondary | ICD-10-CM | POA: Diagnosis not present

## 2020-07-19 DIAGNOSIS — M25621 Stiffness of right elbow, not elsewhere classified: Secondary | ICD-10-CM | POA: Diagnosis not present

## 2020-07-19 DIAGNOSIS — S49091D Other physeal fracture of upper end of humerus, right arm, subsequent encounter for fracture with routine healing: Secondary | ICD-10-CM | POA: Diagnosis not present

## 2020-07-24 DIAGNOSIS — M25611 Stiffness of right shoulder, not elsewhere classified: Secondary | ICD-10-CM | POA: Diagnosis not present

## 2020-07-24 NOTE — Progress Notes (Signed)
Established patient visit   Patient: Rita Cruz   DOB: [birth year]   79 y.o. Female  MRN: [id number] Visit Date: 07/25/2020  Today's healthcare provider: [name], MD   I,Sulibeya S Dimas,acting as a scribe for [name], MD.,have documented all relevant documentation on the behalf of [name], MD,as directed by  [name], MD while in the presence of [name], MD.  Chief Complaint  Patient presents with  . Anxiety  . Depression  . Hypertension   Subjective    HPI  Patient reports she fell while at the beach in 05/21/2020. Patient reports she tripped on a rug wearing flip flops. Patient reports she was seen at [place], [place] [address].  She has since been followed by [facility] at [place].  Hypertension, follow-up  BP Readings from Last 3 Encounters:  07/25/20 124/78  04/05/20 140/70  01/30/20 (!) 150/63   Wt Readings from Last 3 Encounters:  07/25/20 177 lb 12.8 oz (80.6 kg)  04/05/20 181 lb (82.1 kg)  01/30/20 170 lb (77.1 kg)     She was last seen for hypertension 6 months ago.  BP at that visit was 123/85. Management since that visit includes no changes.  She reports excellent compliance with treatment. She is not having side effects.  She is following a Regular diet. She is not exercising. She does not smoke.  Use of agents associated with hypertension: none.   Outside blood pressures are not being checked. Symptoms: No chest pain No chest pressure  No palpitations No syncope  No dyspnea No orthopnea  No paroxysmal nocturnal dyspnea No lower extremity edema   Pertinent labs: Lab Results  Component Value Date   CHOL 119 01/23/2020   HDL 37 (L) 01/23/2020   LDLCALC 55 01/23/2020   TRIG 161 (H) 01/23/2020   CHOLHDL 4.4 01/19/2019   Lab Results  Component Value Date   NA 142 01/23/2020   K 4.2 01/23/2020   CREATININE 0.91 01/23/2020   GFRNONAA 61 01/23/2020    GFRAA 70 01/23/2020   GLUCOSE 94 01/23/2020     The ASCVD Risk score ([place]., et al., 2013) failed to calculate for the following reasons:   The valid total cholesterol range is 130 to 320 mg/dL   --------------------------------------------------------------------------------------------------- Follow up for Anxiety  The patient was last seen for this 6 months ago. Changes made at last visit include continue Zoloft daily and Xanax as needed.  She reports excellent compliance with treatment. She feels that condition is Unchanged. She is not having side effects.   ----------------------------------------------------------------------------------------- Depression, Follow-up  She  was last seen for this 6 months ago. Changes made at last visit include continue Zoloft daily and Xanax as needed.   She reports excellent compliance with treatment. She is not having side effects.   She reports excellent tolerance of treatment. Current symptoms include: depressed mood, difficulty concentrating and fatigue She feels she is Improved since last visit.  Depression screen [facility] 2/9 07/25/2020 01/23/2020 10/13/2019  Decreased Interest 0 0 0  Down, Depressed, Hopeless 0 0 1  PHQ - 2 Score 0 0 1  Altered sleeping 0 0 -  Tired, decreased energy 1 1 -  Change in appetite 0 0 -  Feeling bad or failure about yourself  1 0 -  Trouble concentrating 0 0 -  Moving slowly or fidgety/restless 0 0 -  Suicidal thoughts 0 0 -  PHQ-9 Score 2 1 -  Difficult doing  work/chores Not difficult at all Not difficult at all -   GAD 7 : Generalized Anxiety Score 07/25/2020 07/25/2020 07/21/2019  Nervous, Anxious, on Edge - 1 1  Control/stop worrying - 0 1  Worry too much - different things - 3 1  Trouble relaxing - 2 0  Restless - 0 0  Easily annoyed or irritable - 1 1  Afraid - awful might happen - 0 1  Total GAD 7 Score - 7 5  Anxiety Difficulty Not difficult at all Not difficult at all Somewhat difficult         -----------------------------------------------------------------------------------------  Patient Active Problem List   Diagnosis Date Noted  . Personal history of colonic polyps   . Polyp of descending colon   . Overweight 01/24/2020  . Osteopenia of multiple sites 01/24/2020  . Pelvic organ prolapse quantification stage 1 cystocele 07/25/2019  . 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 Adenomatous polyp of ascending colon 01/18/2019  . Microcalcification of both breasts on mammogram 02/04/2018  . Allergic rhinitis 05/23/2015  . Arthropathy, lower leg 05/23/2015  . Benign neoplasm of colon 05/23/2015  . Clinical depression 05/23/2015  . Essential (primary) hypertension 05/23/2015  . Acid reflux 05/23/2015  . Anxiety, generalized 05/23/2015  . Hypercholesteremia 05/23/2015  . Blood glucose elevated 05/23/2015  . Symptomatic menopausal or female climacteric states 05/23/2015  . Arthritis, degenerative 05/23/2015  . Mixed incontinence 03/24/2013   Social History   Tobacco Use  . Smoking status: Never Smoker  . Smokeless tobacco: Never Used  . Tobacco comment: does not smoke  Vaping Use  . Vaping Use: Never used  Substance Use Topics  . Alcohol use: Yes    Alcohol/week: 7.0 standard drinks    Types: 7 Glasses of wine per week    Comment: 1 glass of wine nightly  . Drug use: No   Allergies  Allergen Reactions  . Morphine And Related Other (See Comments)    Hallucinations   . Percocet  [Oxycodone-Acetaminophen] Other (See Comments)    Hallucinations Hallucinations  . Levaquin [Levofloxacin In D5w] Diarrhea    Vaginal itching  . Lisinopril     cough  . Meloxicam     Upset stomach  . Penicillins Itching  . Preservision Areds 2 [Multiple Vitamins-Minerals] Nausea Only       Medications: Outpatient Medications Prior to Visit  Medication Sig  . acetaminophen (TYLENOL) 325 MG tablet Take 650 mg by mouth  every 6 (six) hours as needed for moderate pain.  Marland Kitchen ALPRAZolam (XANAX) 0.5 MG tablet TAKE 1 TABLET BY MOUTH EVERY 12 HOURS AS NEEDED  . ascorbic Acid (VITAMIN C) 500 MG CPCR Take 500 mg by mouth daily.   Marland Kitchen aspirin (ASPIRIN EC LO-DOSE) 81 MG EC tablet Take 81 mg by mouth at bedtime.   Marland Kitchen co-enzyme Q-10 30 MG capsule Take by mouth daily. Unsure dose  . conjugated estrogens (PREMARIN) vaginal cream Place 1 Applicatorful vaginally 2 (two) times a week.  . fluticasone (FLONASE) 50 MCG/ACT nasal spray Place 2 sprays into both nostrils daily as needed for allergies.  Marland Kitchen GENTEAL TEARS 0.1-0.3 % SOLN as needed.   Marland Kitchen ketotifen (ZADITOR) 0.025 % ophthalmic solution Place 1 drop into both eyes daily as needed (allergies).  . Multiple Vitamins-Minerals (PRESERVISION AREDS 2 PO) Take 1 tablet by mouth 2 (two) times daily.  . [DISCONTINUED] atorvastatin (LIPITOR) 10 MG tablet Take 1 tablet (10 mg total) by mouth daily.  . [DISCONTINUED] losartan (  COZAAR) 50 MG tablet Take 1.5 tablets (75 mg total) by mouth daily.  . [DISCONTINUED] omeprazole (PRILOSEC) 20 MG capsule Take 1 capsule (20 mg total) by mouth daily.  . [DISCONTINUED] oxybutynin (DITROPAN-XL) 5 MG 24 hr tablet Take 1 tablet (5 mg total) by mouth at bedtime.  . [DISCONTINUED] sertraline (ZOLOFT) 100 MG tablet Take 1 tablet (100 mg total) by mouth daily.   No facility-administered medications prior to visit.    Review of Systems  Constitutional: Positive for fatigue. Negative for activity change.  Respiratory: Negative for chest tightness and shortness of breath.   Cardiovascular: Negative for chest pain and palpitations.      Objective    BP 124/78   Pulse 91   Temp 98.2 F (36.8 C) (Oral)   Resp 16   Wt 177 lb 12.8 oz (80.6 kg)   BMI 26.26 kg/m  BP Readings from Last 3 Encounters:  07/25/20 124/78  04/05/20 140/70  01/30/20 (!) 150/63   Wt Readings from Last 3 Encounters:  07/25/20 177 lb 12.8 oz (80.6 kg)  04/05/20 181 lb (82.1  kg)  01/30/20 170 lb (77.1 kg)      Physical Exam Vitals reviewed.  Constitutional:      General: She is not in acute distress.    Appearance: Normal appearance. She is well-developed. She is not diaphoretic.  HENT:     Head: Normocephalic and atraumatic.  Eyes:     General: No scleral icterus.    Conjunctiva/sclera: Conjunctivae normal.  Neck:     Thyroid: No thyromegaly.  Cardiovascular:     Rate and Rhythm: Normal rate and regular rhythm.     Pulses: Normal pulses.     Heart sounds: Normal heart sounds. No murmur heard.   Pulmonary:     Effort: Pulmonary effort is normal. No respiratory distress.     Breath sounds: Normal breath sounds. No wheezing, rhonchi or rales.  Musculoskeletal:     Cervical back: Neck supple.     Right lower leg: No edema.     Left lower leg: No edema.  Lymphadenopathy:     Cervical: No cervical adenopathy.  Skin:    General: Skin is warm and dry.     Findings: No rash.  Neurological:     Mental Status: She is alert and oriented to person, place, and time. Mental status is at baseline.  Psychiatric:        Mood and Affect: Mood normal.        Behavior: Behavior normal.     No results found for any visits on 07/25/20.  Assessment & Plan     Problem List Items Addressed This Visit      Cardiovascular and Mediastinum   Essential (primary) hypertension - Primary    Low blood pressure today initially, but improved to normotensive on recheck Continue current medications Encouraged to monitor home blood pressure Recheck metabolic panel F/u in 3 months       Relevant Medications   atorvastatin (LIPITOR) 10 MG tablet   losartan (COZAAR) 50 MG tablet   Other Relevant Orders   Comprehensive metabolic panel     Other   Clinical depression    Stable Patient has been under stress due to Proximal humerus fracture Continue Zoloft at current dose       Relevant Medications   sertraline (ZOLOFT) 100 MG tablet   Anxiety, generalized     Chronic and fairly well controlled Some exacerbation in the setting of recent medical events  Improved with Zoloft, continue current dose Continue Xanax sparingly at is low dose as possible We have discussed the risk of chronic benzo use especially in the elderly      Relevant Medications   sertraline (ZOLOFT) 100 MG tablet   Hypercholesteremia    Reviewed last lipid panel Tolerating atorvastatin well-continue current dose Continue coq.10 to prevent myalgias She is also taking omega-3's      Relevant Medications   atorvastatin (LIPITOR) 10 MG tablet   losartan (COZAAR) 50 MG tablet    Other Visit Diagnoses    Situational anxiety       Relevant Medications   sertraline (ZOLOFT) 100 MG tablet   Urinary frequency       Relevant Medications   oxybutynin (DITROPAN-XL) 5 MG 24 hr tablet       Return in about 6 months (around 01/22/2021) for chronic disease f/u.      I, [name], MD, have reviewed all documentation for this visit. The documentation on 07/25/20 for the exam, diagnosis, procedures, and orders are all accurate and complete.   Matteo Banke, Dionne Bucy, MD, MPH [facility]

## 2020-07-25 ENCOUNTER — Ambulatory Visit (INDEPENDENT_AMBULATORY_CARE_PROVIDER_SITE_OTHER): Payer: PPO | Admitting: Family Medicine

## 2020-07-25 ENCOUNTER — Encounter: Payer: Self-pay | Admitting: Family Medicine

## 2020-07-25 ENCOUNTER — Other Ambulatory Visit: Payer: Self-pay

## 2020-07-25 VITALS — BP 124/78 | HR 91 | Temp 98.2°F | Resp 16 | Wt 177.8 lb

## 2020-07-25 DIAGNOSIS — F329 Major depressive disorder, single episode, unspecified: Secondary | ICD-10-CM | POA: Diagnosis not present

## 2020-07-25 DIAGNOSIS — E7800 Pure hypercholesterolemia, unspecified: Secondary | ICD-10-CM | POA: Diagnosis not present

## 2020-07-25 DIAGNOSIS — F418 Other specified anxiety disorders: Secondary | ICD-10-CM | POA: Diagnosis not present

## 2020-07-25 DIAGNOSIS — F411 Generalized anxiety disorder: Secondary | ICD-10-CM | POA: Diagnosis not present

## 2020-07-25 DIAGNOSIS — R350 Frequency of micturition: Secondary | ICD-10-CM | POA: Diagnosis not present

## 2020-07-25 DIAGNOSIS — I10 Essential (primary) hypertension: Secondary | ICD-10-CM | POA: Diagnosis not present

## 2020-07-25 MED ORDER — LOSARTAN POTASSIUM 50 MG PO TABS: 75.0000 mg | ORAL_TABLET | Freq: Every day | ORAL | 3 refills | Status: DC

## 2020-07-25 MED ORDER — OMEPRAZOLE 20 MG PO CPDR: 20.0000 mg | DELAYED_RELEASE_CAPSULE | Freq: Every day | ORAL | 3 refills | Status: DC

## 2020-07-25 MED ORDER — OXYBUTYNIN CHLORIDE ER 5 MG PO TB24: 5.0000 mg | ORAL_TABLET | Freq: Every day | ORAL | 6 refills | Status: DC

## 2020-07-25 MED ORDER — ATORVASTATIN CALCIUM 10 MG PO TABS: 10.0000 mg | ORAL_TABLET | Freq: Every day | ORAL | 3 refills | Status: DC

## 2020-07-25 MED ORDER — SERTRALINE HCL 100 MG PO TABS: 100.0000 mg | ORAL_TABLET | Freq: Every day | ORAL | 3 refills | Status: DC

## 2020-07-25 NOTE — Assessment & Plan Note (Signed)
Chronic and fairly well controlled Some exacerbation in the setting of recent medical events Improved with Zoloft, continue current dose Continue Xanax sparingly at is low dose as possible We have discussed the risk of chronic benzo use especially in the elderly

## 2020-07-25 NOTE — Assessment & Plan Note (Addendum)
Low blood pressure today initially, but improved to normotensive on recheck Continue current medications Encouraged to monitor home blood pressure Recheck metabolic panel F/u in 3 months

## 2020-07-25 NOTE — Assessment & Plan Note (Signed)
Reviewed last lipid panel Tolerating atorvastatin well-continue current dose Continue coq.10 to prevent myalgias She is also taking omega-3's

## 2020-07-25 NOTE — Assessment & Plan Note (Addendum)
Stable Patient has been under stress due to Proximal humerus fracture Continue Zoloft at current dose

## 2020-07-25 NOTE — Patient Instructions (Signed)
Please check blood pressure at home and let Dr. Brita Romp know if blood pressure is elevated or low.   Hypertension, Adult High blood pressure (hypertension) is when the force of blood pumping through the arteries is too strong. The arteries are the blood vessels that carry blood from the heart throughout the body. Hypertension forces the heart to work harder to pump blood and may cause arteries to become narrow or stiff. Untreated or uncontrolled hypertension can cause a heart attack, heart failure, a stroke, kidney disease, and other problems. A blood pressure reading consists of a higher number over a lower number. Ideally, your blood pressure should be below 120/80. The first ("top") number is called the systolic pressure. It is a measure of the pressure in your arteries as your heart beats. The second ("bottom") number is called the diastolic pressure. It is a measure of the pressure in your arteries as the heart relaxes. What are the causes? The exact cause of this condition is not known. There are some conditions that result in or are related to high blood pressure. What increases the risk? Some risk factors for high blood pressure are under your control. The following factors may make you more likely to develop this condition:  Smoking.  Having type 2 diabetes mellitus, high cholesterol, or both.  Not getting enough exercise or physical activity.  Being overweight.  Having too much fat, sugar, calories, or salt (sodium) in your diet.  Drinking too much alcohol. Some risk factors for high blood pressure may be difficult or impossible to change. Some of these factors include:  Having chronic kidney disease.  Having a family history of high blood pressure.  Age. Risk increases with age.  Race. You may be at higher risk if you are African American.  Gender. Men are at higher risk than women before age 58. After age 72, women are at higher risk than men.  Having obstructive  sleep apnea.  Stress. What are the signs or symptoms? High blood pressure may not cause symptoms. Very high blood pressure (hypertensive crisis) may cause:  Headache.  Anxiety.  Shortness of breath.  Nosebleed.  Nausea and vomiting.  Vision changes.  Severe chest pain.  Seizures. How is this diagnosed? This condition is diagnosed by measuring your blood pressure while you are seated, with your arm resting on a flat surface, your legs uncrossed, and your feet flat on the floor. The cuff of the blood pressure monitor will be placed directly against the skin of your upper arm at the level of your heart. It should be measured at least twice using the same arm. Certain conditions can cause a difference in blood pressure between your right and left arms. Certain factors can cause blood pressure readings to be lower or higher than normal for a short period of time:  When your blood pressure is higher when you are in a health care provider's office than when you are at home, this is called white coat hypertension. Most people with this condition do not need medicines.  When your blood pressure is higher at home than when you are in a health care provider's office, this is called masked hypertension. Most people with this condition may need medicines to control blood pressure. If you have a high blood pressure reading during one visit or you have normal blood pressure with other risk factors, you may be asked to:  Return on a different day to have your blood pressure checked again.  Monitor your blood  pressure at home for 1 week or longer. If you are diagnosed with hypertension, you may have other blood or imaging tests to help your health care provider understand your overall risk for other conditions. How is this treated? This condition is treated by making healthy lifestyle changes, such as eating healthy foods, exercising more, and reducing your alcohol intake. Your health care provider  may prescribe medicine if lifestyle changes are not enough to get your blood pressure under control, and if:  Your systolic blood pressure is above 130.  Your diastolic blood pressure is above 80. Your personal target blood pressure may vary depending on your medical conditions, your age, and other factors. Follow these instructions at home: Eating and drinking   Eat a diet that is high in fiber and potassium, and low in sodium, added sugar, and fat. An example eating plan is called the DASH (Dietary Approaches to Stop Hypertension) diet. To eat this way: ? Eat plenty of fresh fruits and vegetables. Try to fill one half of your plate at each meal with fruits and vegetables. ? Eat whole grains, such as whole-wheat pasta, brown rice, or whole-grain bread. Fill about one fourth of your plate with whole grains. ? Eat or drink low-fat dairy products, such as skim milk or low-fat yogurt. ? Avoid fatty cuts of meat, processed or cured meats, and poultry with skin. Fill about one fourth of your plate with lean proteins, such as fish, chicken without skin, beans, eggs, or tofu. ? Avoid pre-made and processed foods. These tend to be higher in sodium, added sugar, and fat.  Reduce your daily sodium intake. Most people with hypertension should eat less than 1,500 mg of sodium a day.  Do not drink alcohol if: ? Your health care provider tells you not to drink. ? You are pregnant, may be pregnant, or are planning to become pregnant.  If you drink alcohol: ? Limit how much you use to:  0-1 drink a day for women.  0-2 drinks a day for men. ? Be aware of how much alcohol is in your drink. In the [place] one drink equals one 12 oz bottle of beer (355 mL), one 5 oz glass of wine (148 mL), or one 1 oz glass of hard liquor (44 mL). Lifestyle   Work with your health care provider to maintain a healthy body weight or to lose weight. Ask what an ideal weight is for you.  Get at least 30 minutes of exercise  most days of the week. Activities may include walking, swimming, or biking.  Include exercise to strengthen your muscles (resistance exercise), such as Pilates or lifting weights, as part of your weekly exercise routine. Try to do these types of exercises for 30 minutes at least 3 days a week.  Do not use any products that contain nicotine or tobacco, such as cigarettes, e-cigarettes, and chewing tobacco. If you need help quitting, ask your health care provider.  Monitor your blood pressure at home as told by your health care provider.  Keep all follow-up visits as told by your health care provider. This is important. Medicines  Take over-the-counter and prescription medicines only as told by your health care provider. Follow directions carefully. Blood pressure medicines must be taken as prescribed.  Do not skip doses of blood pressure medicine. Doing this puts you at risk for problems and can make the medicine less effective.  Ask your health care provider about side effects or reactions to medicines that you should  watch for. Contact a health care provider if you:  Think you are having a reaction to a medicine you are taking.  Have headaches that keep coming back (recurring).  Feel dizzy.  Have swelling in your ankles.  Have trouble with your vision. Get help right away if you:  Develop a severe headache or confusion.  Have unusual weakness or numbness.  Feel faint.  Have severe pain in your chest or abdomen.  Vomit repeatedly.  Have trouble breathing. Summary  Hypertension is when the force of blood pumping through your arteries is too strong. If this condition is not controlled, it may put you at risk for serious complications.  Your personal target blood pressure may vary depending on your medical conditions, your age, and other factors. For most people, a normal blood pressure is less than 120/80.  Hypertension is treated with lifestyle changes, medicines, or a  combination of both. Lifestyle changes include losing weight, eating a healthy, low-sodium diet, exercising more, and limiting alcohol. This information is not intended to replace advice given to you by your health care provider. Make sure you discuss any questions you have with your health care provider. Document Revised: 07/20/2018 Document Reviewed: 07/20/2018 Elsevier Patient Education  2020 [employer].

## 2020-07-30 DIAGNOSIS — I10 Essential (primary) hypertension: Secondary | ICD-10-CM | POA: Diagnosis not present

## 2020-07-31 ENCOUNTER — Telehealth: Payer: Self-pay

## 2020-07-31 LAB — COMPREHENSIVE METABOLIC PANEL
ALT: 16 IU/L (ref 0–32)
AST: 21 IU/L (ref 0–40)
Albumin/Globulin Ratio: 1.8 (ref 1.2–2.2)
Albumin: 4.3 g/dL (ref 3.7–4.7)
Alkaline Phosphatase: 102 IU/L (ref 48–121)
BUN/Creatinine Ratio: 9 — ABNORMAL LOW (ref 12–28)
BUN: 8 mg/dL (ref 8–27)
Bilirubin Total: 0.5 mg/dL (ref 0.0–1.2)
CO2: 23 mmol/L (ref 20–29)
Calcium: 9.8 mg/dL (ref 8.7–10.3)
Chloride: 103 mmol/L (ref 96–106)
Creatinine, Ser: 0.89 mg/dL (ref 0.57–1.00)
GFR calc Af Amer: 72 mL/min/{1.73_m2} (ref >59)
GFR calc non Af Amer: 62 mL/min/{1.73_m2} (ref >59)
Globulin, Total: 2.4 g/dL (ref 1.5–4.5)
Glucose: 103 mg/dL — ABNORMAL HIGH (ref 65–99)
Potassium: 4.3 mmol/L (ref 3.5–5.2)
Sodium: 140 mmol/L (ref 134–144)
Total Protein: 6.7 g/dL (ref 6.0–8.5)

## 2020-07-31 NOTE — Telephone Encounter (Signed)
Pt given lab results per notes of Dr. Brita Romp on 07/31/20. Pt verbalized understanding.

## 2020-07-31 NOTE — Telephone Encounter (Signed)
LMTCB 07/31/2020.  PEC please advise pt of lab results below.   Thanks,    -Mickel Baas

## 2020-07-31 NOTE — Telephone Encounter (Signed)
-----   Message from Virginia Crews, MD sent at 07/31/2020  8:12 AM EDT ----- Normal labs

## 2020-08-01 DIAGNOSIS — M25621 Stiffness of right elbow, not elsewhere classified: Secondary | ICD-10-CM | POA: Diagnosis not present

## 2020-08-01 DIAGNOSIS — S49091D Other physeal fracture of upper end of humerus, right arm, subsequent encounter for fracture with routine healing: Secondary | ICD-10-CM | POA: Diagnosis not present

## 2020-08-05 ENCOUNTER — Other Ambulatory Visit: Payer: Self-pay | Admitting: Family Medicine

## 2020-08-05 DIAGNOSIS — F418 Other specified anxiety disorders: Secondary | ICD-10-CM

## 2020-08-05 NOTE — Telephone Encounter (Signed)
Requested medication (s) are due for refill today- yes  Requested medication (s) are on the active medication list -yes  Future visit scheduled -yes  Last refill: 07/06/20  Notes to clinic: Request for non delegated Rx  Requested Prescriptions  Pending Prescriptions Disp Refills   ALPRAZolam (XANAX) 0.5 MG tablet [Pharmacy Med Name: ALPRAZolam 0.5 MG Oral Tablet] 60 tablet 0    Sig: TAKE 1 TABLET BY MOUTH EVERY 12 HOURS AS NEEDED      Not Delegated - Psychiatry:  Anxiolytics/Hypnotics Failed - 08/05/2020  5:31 AM      Failed - This refill cannot be delegated      Failed - Urine Drug Screen completed in last 360 days.      Passed - Valid encounter within last 6 months    Recent Outpatient Visits           1 week ago Essential (primary) hypertension   [facility], Dionne Bucy, MD   6 months ago Encounter for annual physical exam   [facility], Dionne Bucy, MD   1 year ago Need for influenza vaccination   [facility], Dionne Bucy, MD   1 year ago Essential (primary) hypertension   [facility], Dionne Bucy, MD   1 year ago Transient alteration of awareness   [place], Clearnce Sorrel, [place]       Future Appointments             In 5 months Bacigalupo, Dionne Bucy, MD [facility]                Requested Prescriptions  Pending Prescriptions Disp Refills   ALPRAZolam Duanne Moron) 0.5 MG tablet [Pharmacy Med Name: ALPRAZolam 0.5 MG Oral Tablet] 60 tablet 0    Sig: TAKE 1 TABLET BY MOUTH EVERY 12 HOURS AS NEEDED      Not Delegated - Psychiatry:  Anxiolytics/Hypnotics Failed - 08/05/2020  5:31 AM      Failed - This refill cannot be delegated      Failed - Urine Drug Screen completed in last 360 days.      Passed - Valid encounter within last 6 months    Recent Outpatient Visits           1 week ago Essential (primary) hypertension    [facility] Bacigalupo, Dionne Bucy, MD   6 months ago Encounter for annual physical exam   [facility] [place], Dionne Bucy, MD   1 year ago Need for influenza vaccination   [facility], Dionne Bucy, MD   1 year ago Essential (primary) hypertension   [facility], Dionne Bucy, MD   1 year ago Transient alteration of awareness   [place], [place]       Future Appointments             In 5 months Bacigalupo, Dionne Bucy, MD [facility], [place]

## 2020-08-06 ENCOUNTER — Other Ambulatory Visit: Payer: Self-pay

## 2020-08-06 ENCOUNTER — Ambulatory Visit
Admission: RE | Admit: 2020-08-06 | Discharge: 2020-08-06 | Disposition: A | Discharge status: Home / Self Care | Payer: PPO | Source: Ambulatory Visit | Attending: Family Medicine | Admitting: Family Medicine

## 2020-08-06 DIAGNOSIS — Z1231 Encounter for screening mammogram for malignant neoplasm of breast: Secondary | ICD-10-CM | POA: Diagnosis not present

## 2020-08-06 DIAGNOSIS — M25621 Stiffness of right elbow, not elsewhere classified: Secondary | ICD-10-CM | POA: Diagnosis not present

## 2020-08-06 DIAGNOSIS — S49091D Other physeal fracture of upper end of humerus, right arm, subsequent encounter for fracture with routine healing: Secondary | ICD-10-CM | POA: Diagnosis not present

## 2020-08-08 ENCOUNTER — Telehealth: Payer: Self-pay

## 2020-08-08 ENCOUNTER — Other Ambulatory Visit: Payer: Self-pay

## 2020-08-08 ENCOUNTER — Ambulatory Visit (INDEPENDENT_AMBULATORY_CARE_PROVIDER_SITE_OTHER): Payer: PPO

## 2020-08-08 ENCOUNTER — Ambulatory Visit: Payer: Self-pay | Admitting: *Deleted

## 2020-08-08 DIAGNOSIS — Z23 Encounter for immunization: Secondary | ICD-10-CM | POA: Diagnosis not present

## 2020-08-08 NOTE — Telephone Encounter (Signed)
Pt given mammogram results per notes of Dr. Brita Romp on 08/08/20. Pt verbalized understanding.

## 2020-08-08 NOTE — Telephone Encounter (Signed)
LMTCB 08/08/2020.  [facility] please advise pt of her Mammogram results.   Thanks,  -Mickel Baas

## 2020-08-08 NOTE — Telephone Encounter (Signed)
-----   Message from Virginia Crews, MD sent at 08/08/2020  1:19 PM EDT ----- Normal mammogram. Repeat in 1 yr

## 2020-08-09 DIAGNOSIS — S49091D Other physeal fracture of upper end of humerus, right arm, subsequent encounter for fracture with routine healing: Secondary | ICD-10-CM | POA: Diagnosis not present

## 2020-08-09 DIAGNOSIS — M25621 Stiffness of right elbow, not elsewhere classified: Secondary | ICD-10-CM | POA: Diagnosis not present

## 2020-08-13 DIAGNOSIS — M25621 Stiffness of right elbow, not elsewhere classified: Secondary | ICD-10-CM | POA: Diagnosis not present

## 2020-08-13 DIAGNOSIS — S49091D Other physeal fracture of upper end of humerus, right arm, subsequent encounter for fracture with routine healing: Secondary | ICD-10-CM | POA: Diagnosis not present

## 2020-08-20 DIAGNOSIS — M25621 Stiffness of right elbow, not elsewhere classified: Secondary | ICD-10-CM | POA: Diagnosis not present

## 2020-08-20 DIAGNOSIS — S49091D Other physeal fracture of upper end of humerus, right arm, subsequent encounter for fracture with routine healing: Secondary | ICD-10-CM | POA: Diagnosis not present

## 2020-08-21 DIAGNOSIS — Z09 Encounter for follow-up examination after completed treatment for conditions other than malignant neoplasm: Secondary | ICD-10-CM | POA: Diagnosis not present

## 2020-08-21 DIAGNOSIS — S42291D Other displaced fracture of upper end of right humerus, subsequent encounter for fracture with routine healing: Secondary | ICD-10-CM | POA: Diagnosis not present

## 2020-08-27 DIAGNOSIS — S49091D Other physeal fracture of upper end of humerus, right arm, subsequent encounter for fracture with routine healing: Secondary | ICD-10-CM | POA: Diagnosis not present

## 2020-08-27 DIAGNOSIS — M25621 Stiffness of right elbow, not elsewhere classified: Secondary | ICD-10-CM | POA: Diagnosis not present

## 2020-09-06 DIAGNOSIS — M25621 Stiffness of right elbow, not elsewhere classified: Secondary | ICD-10-CM | POA: Diagnosis not present

## 2020-09-06 DIAGNOSIS — S49091D Other physeal fracture of upper end of humerus, right arm, subsequent encounter for fracture with routine healing: Secondary | ICD-10-CM | POA: Diagnosis not present

## 2020-09-10 DIAGNOSIS — S49091D Other physeal fracture of upper end of humerus, right arm, subsequent encounter for fracture with routine healing: Secondary | ICD-10-CM | POA: Diagnosis not present

## 2020-09-10 DIAGNOSIS — M25621 Stiffness of right elbow, not elsewhere classified: Secondary | ICD-10-CM | POA: Diagnosis not present

## 2020-09-12 ENCOUNTER — Ambulatory Visit: Payer: Self-pay | Admitting: *Deleted

## 2020-09-12 NOTE — Chronic Care Management (AMB) (Signed)
CCM status changed to previously enrolled.    Elliot Gurney, [place] Worker  [facility] [phone]

## 2020-09-17 DIAGNOSIS — M25621 Stiffness of right elbow, not elsewhere classified: Secondary | ICD-10-CM | POA: Diagnosis not present

## 2020-09-17 DIAGNOSIS — S49091D Other physeal fracture of upper end of humerus, right arm, subsequent encounter for fracture with routine healing: Secondary | ICD-10-CM | POA: Diagnosis not present

## 2020-09-26 DIAGNOSIS — M25621 Stiffness of right elbow, not elsewhere classified: Secondary | ICD-10-CM | POA: Diagnosis not present

## 2020-09-26 DIAGNOSIS — S49091D Other physeal fracture of upper end of humerus, right arm, subsequent encounter for fracture with routine healing: Secondary | ICD-10-CM | POA: Diagnosis not present

## 2020-10-02 DIAGNOSIS — S42321D Displaced transverse fracture of shaft of humerus, right arm, subsequent encounter for fracture with routine healing: Secondary | ICD-10-CM | POA: Diagnosis not present

## 2020-10-02 DIAGNOSIS — M25621 Stiffness of right elbow, not elsewhere classified: Secondary | ICD-10-CM | POA: Diagnosis not present

## 2020-10-07 ENCOUNTER — Other Ambulatory Visit: Payer: Self-pay | Admitting: Family Medicine

## 2020-10-07 DIAGNOSIS — F418 Other specified anxiety disorders: Secondary | ICD-10-CM

## 2020-10-07 NOTE — Telephone Encounter (Signed)
Requested medication (s) are due for refill today: no  Requested medication (s) are on the active medication list: yes  Last refill:  08/05/20 #60 3 refills  Future visit scheduled: yes   Notes to clinic:  not delegated per protocol, Patient is out of town and accidentally left medications back at home. Patient is requesting to transfer to new pharmacy requesting a 90 supply to cost less. Please advise.     Requested Prescriptions  Pending Prescriptions Disp Refills   ALPRAZolam (XANAX) 0.5 MG tablet 60 tablet 3    Sig: Take 1 tablet (0.5 mg total) by mouth every 12 (twelve) hours as needed.      Not Delegated - Psychiatry:  Anxiolytics/Hypnotics Failed - 10/07/2020 10:20 AM      Failed - This refill cannot be delegated      Failed - Urine Drug Screen completed in last 360 days      Passed - Valid encounter within last 6 months    Recent Outpatient Visits           2 months ago Essential (primary) hypertension   [facility], Dionne Bucy, MD   8 months ago Encounter for annual physical exam   [facility], Dionne Bucy, MD   1 year ago Need for influenza vaccination   [facility] [place], Dionne Bucy, MD   1 year ago Essential (primary) hypertension   [facility] [place], Dionne Bucy, MD   1 year ago Transient alteration of awareness   [place], Clearnce Sorrel, [place]       Future Appointments             In 3 months Bacigalupo, Dionne Bucy, MD [facility], [place]

## 2020-10-07 NOTE — Telephone Encounter (Signed)
Medication Refill - Medication: ALPRAZolam (XANAX) 0.5 MG tablet,losartan (COZAAR) 50 MG tablet (Patient is out of town and accidentally left medication back at home. Patient needs medication transferred to new pharmacy and also is requesting a 90 day supply so that costs are less.)  Has the patient contacted their pharmacy?yes (Agent: If no, request that the patient contact the pharmacy for the refill.) (Agent: If yes, when and what did the pharmacy advise?)Contact PCP  Preferred Pharmacy (with phone number or street name):  [place], [place] - [address] Phone:  [phone]  Fax:  [phone]       Agent: Please be advised that RX refills may take up to 3 business days. We ask that you follow-up with your pharmacy.

## 2020-10-07 NOTE — Telephone Encounter (Signed)
Requested medication (s) are due for refill today: Yes  Requested medication (s) are on the active medication list: Yes  Last refill:  08/05/20  Future visit scheduled: Yes  Notes to clinic:  See request.    Requested Prescriptions  Pending Prescriptions Disp Refills   ALPRAZolam (XANAX) 0.5 MG tablet [Pharmacy Med Name: ALPRAZolam 0.5 MG Oral Tablet] 60 tablet 0    Sig: TAKE 1 TABLET BY MOUTH EVERY 12 HOURS AS NEEDED      Not Delegated - Psychiatry:  Anxiolytics/Hypnotics Failed - 10/07/2020  1:08 PM      Failed - This refill cannot be delegated      Failed - Urine Drug Screen completed in last 360 days      Passed - Valid encounter within last 6 months    Recent Outpatient Visits           2 months ago Essential (primary) hypertension   [facility], Dionne Bucy, MD   8 months ago Encounter for annual physical exam   [facility] [place], Dionne Bucy, MD   1 year ago Need for influenza vaccination   [facility] [place], Dionne Bucy, MD   1 year ago Essential (primary) hypertension   [facility] [place], Dionne Bucy, MD   1 year ago Transient alteration of awareness   [place], Clearnce Sorrel, [place]       Future Appointments             In 3 months Bacigalupo, Dionne Bucy, MD [facility], [place]

## 2020-10-07 NOTE — Telephone Encounter (Signed)
Sent in 30 day supply with 3 refills earlier today.  No 90 day supplies of controlled substances.

## 2020-10-24 DIAGNOSIS — S49091D Other physeal fracture of upper end of humerus, right arm, subsequent encounter for fracture with routine healing: Secondary | ICD-10-CM | POA: Diagnosis not present

## 2020-10-24 DIAGNOSIS — M25621 Stiffness of right elbow, not elsewhere classified: Secondary | ICD-10-CM | POA: Diagnosis not present

## 2020-11-05 DIAGNOSIS — S49091D Other physeal fracture of upper end of humerus, right arm, subsequent encounter for fracture with routine healing: Secondary | ICD-10-CM | POA: Diagnosis not present

## 2020-11-05 DIAGNOSIS — M25621 Stiffness of right elbow, not elsewhere classified: Secondary | ICD-10-CM | POA: Diagnosis not present

## 2020-11-18 DIAGNOSIS — H353131 Nonexudative age-related macular degeneration, bilateral, early dry stage: Secondary | ICD-10-CM | POA: Diagnosis not present

## 2020-11-19 DIAGNOSIS — S49091D Other physeal fracture of upper end of humerus, right arm, subsequent encounter for fracture with routine healing: Secondary | ICD-10-CM | POA: Diagnosis not present

## 2020-11-19 DIAGNOSIS — M25621 Stiffness of right elbow, not elsewhere classified: Secondary | ICD-10-CM | POA: Diagnosis not present

## 2020-12-04 DIAGNOSIS — S49091D Other physeal fracture of upper end of humerus, right arm, subsequent encounter for fracture with routine healing: Secondary | ICD-10-CM | POA: Diagnosis not present

## 2020-12-10 ENCOUNTER — Other Ambulatory Visit: Payer: Self-pay | Admitting: Family Medicine

## 2020-12-10 DIAGNOSIS — F418 Other specified anxiety disorders: Secondary | ICD-10-CM

## 2020-12-10 MED ORDER — ALPRAZOLAM 0.5 MG PO TABS: 0.5000 mg | ORAL_TABLET | Freq: Two times a day (BID) | ORAL | 3 refills | Status: DC | PRN

## 2020-12-10 NOTE — Telephone Encounter (Signed)
Requested medication (s) are due for refill today - no  Requested medication (s) are on the active medication list -yes  Future visit scheduled -yes  Last refill: 10/07/20 #60 3 RF  Notes to clinic: Patient is request Rx transfer to new pharmacy- non delegated rx  Requested Prescriptions  Pending Prescriptions Disp Refills   ALPRAZolam (XANAX) 0.5 MG tablet 60 tablet 3    Sig: Take 1 tablet (0.5 mg total) by mouth every 12 (twelve) hours as needed.      Not Delegated - Psychiatry:  Anxiolytics/Hypnotics Failed - 12/10/2020  1:32 PM      Failed - This refill cannot be delegated      Failed - Urine Drug Screen completed in last 360 days      Passed - Valid encounter within last 6 months    Recent Outpatient Visits           4 months ago Essential (primary) hypertension   [facility], Dionne Bucy, MD   10 months ago Encounter for annual physical exam   [facility], Dionne Bucy, MD   1 year ago Need for influenza vaccination   [facility], Dionne Bucy, MD   1 year ago Essential (primary) hypertension   [facility] [place], Dionne Bucy, MD   1 year ago Transient alteration of awareness   [facility] [place], Clearnce Sorrel, [place]       Future Appointments             In 1 month Bacigalupo, Dionne Bucy, MD [facility]                 Requested Prescriptions  Pending Prescriptions Disp Refills   ALPRAZolam (XANAX) 0.5 MG tablet 60 tablet 3    Sig: Take 1 tablet (0.5 mg total) by mouth every 12 (twelve) hours as needed.      Not Delegated - Psychiatry:  Anxiolytics/Hypnotics Failed - 12/10/2020  1:32 PM      Failed - This refill cannot be delegated      Failed - Urine Drug Screen completed in last 360 days      Passed - Valid encounter within last 6 months    Recent Outpatient Visits           4 months ago Essential (primary) hypertension    [facility], Dionne Bucy, MD   10 months ago Encounter for annual physical exam   [facility] [place], Dionne Bucy, MD   1 year ago Need for influenza vaccination   [facility], Dionne Bucy, MD   1 year ago Essential (primary) hypertension   [facility], Dionne Bucy, MD   1 year ago Transient alteration of awareness   [facility] [place], Clearnce Sorrel, [place]       Future Appointments             In 1 month Bacigalupo, Dionne Bucy, MD [facility], [place]

## 2020-12-10 NOTE — Telephone Encounter (Signed)
Relation to pt: self  Call back number: [phone]  Pharmacy: [facility] [address], [place], [place] [address] [phone]  Reason for call:  Patient would like ALPRAZolam Duanne Moron) 0.5 MG tablet sent to a new pharmacy [place], [place], [place] [address]  [phone]. Patient contacted the pharmacy and was advised a new Rx was needed and unable to transfer RX from old pharmacy.

## 2020-12-13 DIAGNOSIS — Z20822 Contact with and (suspected) exposure to covid-19: Secondary | ICD-10-CM | POA: Diagnosis not present

## 2020-12-30 NOTE — Progress Notes (Signed)
Subjective:   Rita Cruz is a 80 y.o. female who presents for Medicare Annual (Subsequent) preventive examination.  I connected with Jasmine Awe Streets today by telephone and verified that I am speaking with the correct person using two identifiers. Location patient: home Location provider: work Persons participating in the virtual visit: patient, provider.   I discussed the limitations, risks, security and privacy concerns of performing an evaluation and management service by telephone and the availability of in person appointments. I also discussed with the patient that there may be a patient responsible charge related to this service. The patient expressed understanding and verbally consented to this telephonic visit.    Interactive audio and video telecommunications were attempted between this provider and patient, however failed, due to patient having technical difficulties OR patient did not have access to video capability.  We continued and completed visit with audio only.   Review of Systems    N/A  Cardiac Risk Factors include: advanced age (>39men, >24 women);dyslipidemia;hypertension     Objective:    Today's Vitals   12/31/20 1037  PainSc: 3    There is no height or weight on file to calculate BMI.  Advanced Directives 12/31/2020 01/30/2020 12/26/2019 01/23/2019 01/23/2019 01/19/2019 12/30/2018  Does Patient Have a Medical Advance Directive? Yes Yes Yes Yes Yes Yes Yes  Type of Paramedic of [place];Living will - [place];Living will Healthcare Power of [place] of [place] -  Does patient want to make changes to medical advance directive? - - - No - Patient declined - No - Patient declined -  Copy of [place] in Chart? Yes - validated most recent copy scanned in chart (See row information) - Yes - validated most recent copy scanned in chart (See row  information) Yes - validated most recent copy scanned in chart (See row information) Yes - validated most recent copy scanned in chart (See row information) Yes - validated most recent copy scanned in chart (See row information) -  Would patient like information on creating a medical advance directive? - - - - - - -    Current Medications (verified) Outpatient Encounter Medications as of 12/31/2020  Medication Sig  . acetaminophen (TYLENOL) 325 MG tablet Take 650 mg by mouth every 6 (six) hours as needed for moderate pain.  Marland Kitchen ALPRAZolam (XANAX) 0.5 MG tablet Take 1 tablet (0.5 mg total) by mouth every 12 (twelve) hours as needed.  Marland Kitchen ascorbic Acid (VITAMIN C) 500 MG CPCR Take 500 mg by mouth daily.   Marland Kitchen aspirin 81 MG EC tablet Take 81 mg by mouth at bedtime.   Marland Kitchen atorvastatin (LIPITOR) 10 MG tablet Take 1 tablet (10 mg total) by mouth daily.  . Calcium Carbonate (CALCIUM 600 PO) Take 600 mg by mouth daily at 6 (six) AM.  . co-enzyme Q-10 30 MG capsule Take by mouth daily. Unsure dose  . conjugated estrogens (PREMARIN) vaginal cream Place 1 Applicatorful vaginally 2 (two) times a week.  . fluticasone (FLONASE) 50 MCG/ACT nasal spray Place 2 sprays into both nostrils daily as needed for allergies.  Marland Kitchen GENTEAL TEARS 0.1-0.3 % SOLN as needed.   Marland Kitchen losartan (COZAAR) 50 MG tablet Take 1.5 tablets (75 mg total) by mouth daily.  . Multiple Vitamins-Minerals (PRESERVISION AREDS 2 PO) Take 1 tablet by mouth 2 (two) times daily.  Marland Kitchen omeprazole (PRILOSEC) 20 MG capsule Take 1 capsule (20 mg total) by mouth daily.  Marland Kitchen  sertraline (ZOLOFT) 100 MG tablet Take 1 tablet (100 mg total) by mouth daily.  Marland Kitchen ketotifen (ZADITOR) 0.025 % ophthalmic solution Place 1 drop into both eyes daily as needed (allergies). (Patient not taking: Reported on 12/31/2020)  . oxybutynin (DITROPAN-XL) 5 MG 24 hr tablet Take 1 tablet (5 mg total) by mouth at bedtime. (Patient not taking: Reported on 12/31/2020)   No facility-administered encounter  medications on file as of 12/31/2020.    Allergies (verified) Morphine and related, Percocet  [oxycodone-acetaminophen], Levaquin [levofloxacin in d5w], Lisinopril, Meloxicam, Penicillins, and Preservision areds 2 [multiple vitamins-minerals]   History: Past Medical History:  Diagnosis Date  . Adenomatous polyps   . Anxiety   . Arthritis    neck  . Bronchitis 02/04/2018  . Capsulitis of toe of right foot   . Chest pain    Adenosine Myoview in 2006 done for atypical chest pain, showed no eviden e for ischemia or infarct  . Colon polyps   . GERD (gastroesophageal reflux disease)   . History of colonic polyps   . HTN (hypertension)   . Hyperlipidemia   . PUD (peptic ulcer disease)    with hx of H. Pylori   Past Surgical History:  Procedure Laterality Date  . ABDOMINAL HYSTERECTOMY    . BREAST BIOPSY Right 2011   benign mass done in Dr. [place] office  . BREAST BIOPSY Right 06/29/2015   top hat marker. PROLIFERATIVE FIBROCYSTIC CHANGE WITH ASSOCIATED   . CERVICAL DISCECTOMY    . CHOLECYSTECTOMY    . COLONOSCOPY WITH PROPOFOL N/A 10/06/2016   Procedure: COLONOSCOPY WITH PROPOFOL;  Surgeon: Lollie Sails, MD;  Location: [facility] ENDOSCOPY;  Service: Endoscopy;  Laterality: N/A;  . COLONOSCOPY WITH PROPOFOL N/A 12/30/2018   Procedure: COLONOSCOPY WITH PROPOFOL;  Surgeon: Lollie Sails, MD;  Location: [facility] ENDOSCOPY;  Service: Endoscopy;  Laterality: N/A;  . COLONOSCOPY WITH PROPOFOL N/A 01/30/2020   Procedure: COLONOSCOPY WITH PROPOFOL;  Surgeon: Lucilla Lame, MD;  Location: [facility] ENDOSCOPY;  Service: Endoscopy;  Laterality: N/A;  . EYE SURGERY    . FRACTURE SURGERY Right 05/21/2020   fell while at the beach. Proximal humerus fracture  . hysterectomy-unspecified area    . LAPAROSCOPIC RIGHT COLECTOMY Right 01/23/2019   Procedure: LAPAROSCOPIC RIGHT COLECTOMY;  Surgeon: Robert Bellow, MD;  Location: [facility] ORS;  Service: General;  Laterality: Right;   Family History  Problem  Relation Age of Onset  . Kidney failure Father   . Heart failure Father   . Diabetes Father   . COPD Mother   . Colon cancer Mother 9  . Heart attack Mother   . Ulcers Mother   . Osteoporosis Mother   . Macular degeneration Mother   . Hypertension Brother   . Irritable bowel syndrome Daughter   . Colon polyps Daughter   . Kidney Stones Son   . Bipolar disorder Son   . Melanoma Son        stage IV  . Hypertension Son   . Kidney cancer Other   . Breast cancer Neg Hx    Social History   Socioeconomic History  . Marital status: Married    Spouse name: Not on file  . Number of children: 2  . Years of education: Not on file  . Highest education level: Some college, no degree  Occupational History  . Occupation: retired  Tobacco Use  . Smoking status: Never Smoker  . Smokeless tobacco: Never Used  . Tobacco comment: does not smoke  Vaping Use  . Vaping Use: Never used  Substance and Sexual Activity  . Alcohol use: Yes    Alcohol/week: 7.0 standard drinks    Types: 7 Glasses of wine per week    Comment: 1 glass of wine nightly  . Drug use: No  . Sexual activity: Not Currently    Birth control/protection: Surgical    Comment: Hysterectomy  Other Topics Concern  . Not on file  Social History Narrative   Married-2nd marriage. She has 2 children and her husband has 2 children.    Social Determinants of Health   Financial Resource Strain: Low Risk   . Difficulty of Paying Living Expenses: Not hard at all  Food Insecurity: No Food Insecurity  . Worried About Charity fundraiser in the Last Year: Never true  . Ran Out of Food in the Last Year: Never true  Transportation Needs: No Transportation Needs  . Lack of Transportation (Medical): No  . Lack of Transportation (Non-Medical): No  Physical Activity: Sufficiently Active  . Days of Exercise per Week: 4 days  . Minutes of Exercise per Session: 40 min  Stress: Stress Concern Present  . Feeling of Stress : To some  extent  Social Connections: Socially Integrated  . Frequency of Communication with Friends and Family: More than three times a week  . Frequency of Social Gatherings with Friends and Family: More than three times a week  . Attends Religious Services: More than 4 times per year  . Active Member of Clubs or Organizations: Yes  . Attends Archivist Meetings: More than 4 times per year  . Marital Status: Married    Tobacco Counseling Counseling given: Not Answered Comment: does not smoke   Clinical Intake:  Pre-visit preparation completed: Yes  Pain : 0-10 (Has sciatic nerve issues.) Pain Score: 3  Pain Type: Chronic pain Pain Location: Back Pain Orientation: Lower Pain Descriptors / Indicators: Radiating,Sore Pain Frequency: Intermittent Pain Relieving Factors: Takes Tylenol as needed.  Pain Relieving Factors: Takes Tylenol as needed.  Nutritional Risks: None Diabetes: No  How often do you need to have someone help you when you read instructions, pamphlets, or other written materials from your doctor or pharmacy?: 1 - Never  Diabetic? No  Interpreter Needed?: No  Information entered by :: [facility], LPN   Activities of Daily Living In your present state of health, do you have any difficulty performing the following activities: 12/31/2020  Hearing? N  Vision? N  Comment Has MD.  Difficulty concentrating or making decisions? N  Walking or climbing stairs? N  Dressing or bathing? N  Doing errands, shopping? N  Preparing Food and eating ? N  Using the Toilet? N  In the past six months, have you accidently leaked urine? Y  Comment Often, wears depends at nights.  Do you have problems with loss of bowel control? N  Managing your Medications? N  Managing your Finances? N  Housekeeping or managing your Housekeeping? N  Some recent data might be hidden    Patient Care Team: Virginia Crews, MD as PCP - General (Family Medicine) Birder Robson, MD as  Referring Physician (Ophthalmology) Sharlet Salina, MD as Referring Physician (Physical Medicine and Rehabilitation) Margaretha Sheffield, MD (Otolaryngology) Lucilla Lame, MD as Consulting Physician (Gastroenterology) Bary Castilla Forest Gleason, MD as Consulting Physician (General Surgery)  Indicate any recent Medical Services you may have received from other than [facility] providers in the past year (date may be approximate).  Assessment:   This is a routine wellness examination for Jasmine Awe.  Hearing/Vision screen No exam data present  Dietary issues and exercise activities discussed: Current Exercise Habits: Home exercise routine, Type of exercise: walking, Time (Minutes): 40, Frequency (Times/Week): 4, Weekly Exercise (Minutes/Week): 160, Intensity: Mild, Exercise limited by: orthopedic condition(s)  Goals      Patient Stated   .  "I need help managing my stress" (pt-stated)      Current Barriers:  . Acute Mental Health needs related to adjustment to spouse's medical condition and family conflicts  . Mental Health Concerns  . Suicidal Ideation/Homicidal Ideation: No  Clinical Social Work Goal(s):  Marland Kitchen Over the next 90 days, patient will work with SW bi-weekly by telephone or in person to reduce or manage symptoms related to stress management   Interventions: . Status of patient's stress level discussed as well as possible triggers . Confirmed that patient was doing much better due to son and daughter's surgery being a success as well as the delivery of her great grand daughter  . Discussed current coping strategies used, (crafting, prayer, reaching out to friends)  exploring alternative perspectives . Discussed plan to go the beach for the next month with spouse (11/30/19-12/16/19) . Positive Reinforcement provided in regards to the development of a self care plan including re-arranging medical appointments in-order to extend time at the beach . Discussed plans with patient for ongoing care  management follow up and provided patient with direct contact information for care management team if needed in the future . Emotional/Supportive Counseling provided related to family stress experienced allowing her to vent her frustrations and celebrate her sucessess  Patient Self Care Activities:  . Performs ADL's independently . Performs IADL's independently . Ability for insight  Patient Coping Strengths:  . Supportive Relationships . Friends . Church . Hopefulness . Able to Communicate Effectively  Patient Self Care Deficits:  . Difficulty verbalizing plan for self care  Please see past updates related to this goal by clicking on the "Past Updates" button in the selected goal        Other   .  DIET - DECREASE SODA OR JUICE INTAKE      Recommend to cut back to 1 soda a day and increase water intake to 4-6 8 oz glasses a day.     Marland Kitchen  DIET - INCREASE WATER INTAKE      Recommend increasing water intake 6-8 glasses of water a day.       Depression Screen PHQ 2/9 Scores 12/31/2020 07/25/2020 01/23/2020 10/13/2019 07/21/2019 05/01/2019 12/20/2018  PHQ - 2 Score 0 0 0 1 1 1 1   PHQ- 9 Score - 2 1 - 4 - 2    Fall Risk Fall Risk  12/31/2020 07/25/2020 12/26/2019 05/01/2019 02/02/2019  Falls in the past year? 1 1 0 1 0  Number falls in past yr: 0 0 0 0 -  Injury with Fall? 1 1 0 0 -  Risk for fall due to : - Other (Comment);Orthopedic patient - - -  Follow up Falls prevention discussed Falls evaluation completed;Falls prevention discussed - - -    FALL RISK PREVENTION PERTAINING TO THE HOME:  Any stairs in or around the home? Yes  If so, are there any without handrails? No  Home free of loose throw rugs in walkways, pet beds, electrical cords, etc? Yes  Adequate lighting in your home to reduce risk of falls? Yes   ASSISTIVE DEVICES UTILIZED TO PREVENT FALLS:  Life alert? No  Use of a cane, walker or w/c? No  Grab bars in the bathroom? No  Shower chair or bench in shower? Yes  Elevated  toilet seat or a handicapped toilet? Yes    Cognitive Function: Normal cognitive status assessed by observation by this Nurse Health Advisor. No abnormalities found.       6CIT Screen 12/26/2019 12/20/2018 12/07/2017 12/08/2016  What Year? 0 points 0 points 0 points 0 points  What month? 0 points 0 points 0 points 0 points  What time? 0 points 0 points 0 points 0 points  Count back from 20 0 points 0 points 0 points 0 points  Months in reverse 0 points 0 points 0 points 0 points  Repeat phrase 0 points 2 points 0 points 0 points  Total Score 0 2 0 0    Immunizations Immunization History  Administered Date(s) Administered  . Fluad Quad(high Dose 65+) 07/21/2019, 08/08/2020  . Influenza, High Dose Seasonal PF 08/25/2017, 08/13/2018  . PFIZER(Purple Top)SARS-COV-2 Vaccination 01/08/2020, 02/06/2020, 08/27/2020  . Pneumococcal Conjugate-13 05/17/2014  . Pneumococcal Polysaccharide-23 04/11/2012  . Td 04/08/2010  . Tdap 04/11/2012  . Zoster 02/04/2009  . Zoster Recombinat (Shingrix) 06/14/2019, 09/07/2019    TDAP status: Up to date  Flu Vaccine status: Up to date  Pneumococcal vaccine status: Up to date  Covid-19 vaccine status: Completed vaccines  Qualifies for Shingles Vaccine? Yes   Zostavax completed Yes   Shingrix Completed?: Yes  Screening Tests Health Maintenance  Topic Date Due  . COLONOSCOPY (Pts 45-41yrs Insurance coverage will need to be confirmed)  01/29/2021  . TETANUS/TDAP  04/11/2022  . DEXA SCAN  04/26/2024  . INFLUENZA VACCINE  Completed  . COVID-19 Vaccine  Completed  . PNA vac Low Risk Adult  Completed    Health Maintenance  There are no preventive care reminders to display for this patient.  Colorectal cancer screening: Type of screening: Colonoscopy. Completed 01/30/20. Repeat every year.  Mammogram status: No longer required due to age.  Bone Density status: Completed 04/27/19. Results reflect: Bone density results: OSTEOPENIA. Repeat every 5  years.  Lung Cancer Screening: (Low Dose CT Chest recommended if Age 42-80 years, 30 pack-year currently smoking OR have quit w/in 15years.) does not qualify.   Additional Screening:  Vision Screening: Recommended annual ophthalmology exams for early detection of glaucoma and other disorders of the eye. Is the patient up to date with their annual eye exam?  Yes  Who is the provider or what is the name of the office in which the patient attends annual eye exams? Dr George Ina @ [facility] If pt is not established with a provider, would they like to be referred to a provider to establish care? No .   Dental Screening: Recommended annual dental exams for proper oral hygiene  Community Resource Referral / Chronic Care Management: CRR required this visit?  No   CCM required this visit?  No      Plan:     I have personally reviewed and noted the following in the patient's chart:   . Medical and social history . Use of alcohol, tobacco or illicit drugs  . Current medications and supplements . Functional ability and status . Nutritional status . Physical activity . Advanced directives . List of other physicians . Hospitalizations, surgeries, and ER visits in previous 12 months . Vitals . Screenings to include cognitive, depression, and falls . Referrals and appointments  In addition, I have reviewed and discussed with patient  certain preventive protocols, quality metrics, and best practice recommendations. A written personalized care plan for preventive services as well as general preventive health recommendations were provided to patient.     Perri Lamagna [place], [place]   [id number]   Nurse Notes: None.

## 2020-12-31 ENCOUNTER — Other Ambulatory Visit: Payer: Self-pay

## 2020-12-31 ENCOUNTER — Ambulatory Visit (INDEPENDENT_AMBULATORY_CARE_PROVIDER_SITE_OTHER): Payer: PPO

## 2020-12-31 DIAGNOSIS — Z0000 Encounter for general adult medical examination without abnormal findings: Secondary | ICD-10-CM | POA: Diagnosis not present

## 2020-12-31 NOTE — Patient Instructions (Signed)
Rita Cruz , Thank you for taking time to come for your Medicare Wellness Visit. I appreciate your ongoing commitment to your health goals. Please review the following plan we discussed and let me know if I can assist you in the future.   Screening recommendations/referrals: Colonoscopy: Up to date, due 01/2021 Mammogram: No longer required.  Bone Density: Up to date, due 04/2024 Recommended yearly ophthalmology/optometry visit for glaucoma screening and checkup Recommended yearly dental visit for hygiene and checkup  Vaccinations: Influenza vaccine: Done 08/08/20 Pneumococcal vaccine: Completed series Tdap vaccine: Up to date, due 03/2022 Shingles vaccine: Completed series    Advanced directives: Currently on file.   Conditions/risks identified: Fall risk preventatives discussed today. Recommend to cut back to 1 soda a day and increase water intake to 4-6 8 oz glasses a day.   Next appointment: 01/23/21 @ 11:00 AM with Dr Brita Romp    Preventive Care 79 Years and Older, Female Preventive care refers to lifestyle choices and visits with your health care provider that can promote health and wellness. What does preventive care include?  A yearly physical exam. This is also called an annual well check.  Dental exams once or twice a year.  Routine eye exams. Ask your health care provider how often you should have your eyes checked.  Personal lifestyle choices, including:  Daily care of your teeth and gums.  Regular physical activity.  Eating a healthy diet.  Avoiding tobacco and drug use.  Limiting alcohol use.  Practicing safe sex.  Taking low-dose aspirin every day.  Taking vitamin and mineral supplements as recommended by your health care provider. What happens during an annual well check? The services and screenings done by your health care provider during your annual well check will depend on your age, overall health, lifestyle risk factors, and family history of  disease. Counseling  Your health care provider may ask you questions about your:  Alcohol use.  Tobacco use.  Drug use.  Emotional well-being.  Home and relationship well-being.  Sexual activity.  Eating habits.  History of falls.  Memory and ability to understand (cognition).  Work and work Statistician.  Reproductive health. Screening  You may have the following tests or measurements:  Height, weight, and BMI.  Blood pressure.  Lipid and cholesterol levels. These may be checked every 5 years, or more frequently if you are over 66 years old.  Skin check.  Lung cancer screening. You may have this screening every year starting at age 79 if you have a 30-pack-year history of smoking and currently smoke or have quit within the past 15 years.  Fecal occult blood test (FOBT) of the stool. You may have this test every year starting at age 79.  Flexible sigmoidoscopy or colonoscopy. You may have a sigmoidoscopy every 5 years or a colonoscopy every 10 years starting at age 79.  Hepatitis C blood test.  Hepatitis B blood test.  Sexually transmitted disease (STD) testing.  Diabetes screening. This is done by checking your blood sugar (glucose) after you have not eaten for a while (fasting). You may have this done every 1-3 years.  Bone density scan. This is done to screen for osteoporosis. You may have this done starting at age 79.  Mammogram. This may be done every 1-2 years. Talk to your health care provider about how often you should have regular mammograms. Talk with your health care provider about your test results, treatment options, and if necessary, the need for more tests. Vaccines  Your health care provider may recommend certain vaccines, such as:  Influenza vaccine. This is recommended every year.  Tetanus, diphtheria, and acellular pertussis (Tdap, Td) vaccine. You may need a Td booster every 10 years.  Zoster vaccine. You may need this after age  79.  Pneumococcal 13-valent conjugate (PCV13) vaccine. One dose is recommended after age 79.  Pneumococcal polysaccharide (PPSV23) vaccine. One dose is recommended after age 79. Talk to your health care provider about which screenings and vaccines you need and how often you need them. This information is not intended to replace advice given to you by your health care provider. Make sure you discuss any questions you have with your health care provider. Document Released: 12/06/2015 Document Revised: 07/29/2016 Document Reviewed: 09/10/2015 Elsevier Interactive Patient Education  2017 [employer] [place] Prevention in the Home Falls can cause injuries. They can happen to people of all ages. There are many things you can do to make your home safe and to help prevent falls. What can I do on the outside of my home?  Regularly fix the edges of walkways and driveways and fix any cracks.  Remove anything that might make you trip as you walk through a door, such as a raised step or threshold.  Trim any bushes or trees on the path to your home.  Use bright outdoor lighting.  Clear any walking paths of anything that might make someone trip, such as rocks or tools.  Regularly check to see if handrails are loose or broken. Make sure that both sides of any steps have handrails.  Any raised decks and porches should have guardrails on the edges.  Have any leaves, snow, or ice cleared regularly.  Use sand or salt on walking paths during winter.  Clean up any spills in your garage right away. This includes oil or grease spills. What can I do in the bathroom?  Use night lights.  Install grab bars by the toilet and in the tub and shower. Do not use towel bars as grab bars.  Use non-skid mats or decals in the tub or shower.  If you need to sit down in the shower, use a plastic, non-slip stool.  Keep the floor dry. Clean up any water that spills on the floor as soon as it happens.  Remove  soap buildup in the tub or shower regularly.  Attach bath mats securely with double-sided non-slip rug tape.  Do not have throw rugs and other things on the floor that can make you trip. What can I do in the bedroom?  Use night lights.  Make sure that you have a light by your bed that is easy to reach.  Do not use any sheets or blankets that are too big for your bed. They should not hang down onto the floor.  Have a firm chair that has side arms. You can use this for support while you get dressed.  Do not have throw rugs and other things on the floor that can make you trip. What can I do in the kitchen?  Clean up any spills right away.  Avoid walking on wet floors.  Keep items that you use a lot in easy-to-reach places.  If you need to reach something above you, use a strong step stool that has a grab bar.  Keep electrical cords out of the way.  Do not use floor polish or wax that makes floors slippery. If you must use wax, use non-skid floor wax.  Do  not have throw rugs and other things on the floor that can make you trip. What can I do with my stairs?  Do not leave any items on the stairs.  Make sure that there are handrails on both sides of the stairs and use them. Fix handrails that are broken or loose. Make sure that handrails are as long as the stairways.  Check any carpeting to make sure that it is firmly attached to the stairs. Fix any carpet that is loose or worn.  Avoid having throw rugs at the top or bottom of the stairs. If you do have throw rugs, attach them to the floor with carpet tape.  Make sure that you have a light switch at the top of the stairs and the bottom of the stairs. If you do not have them, ask someone to add them for you. What else can I do to help prevent falls?  Wear shoes that:  Do not have high heels.  Have rubber bottoms.  Are comfortable and fit you well.  Are closed at the toe. Do not wear sandals.  If you use a  stepladder:  Make sure that it is fully opened. Do not climb a closed stepladder.  Make sure that both sides of the stepladder are locked into place.  Ask someone to hold it for you, if possible.  Clearly mark and make sure that you can see:  Any grab bars or handrails.  First and last steps.  Where the edge of each step is.  Use tools that help you move around (mobility aids) if they are needed. These include:  Canes.  Walkers.  Scooters.  Crutches.  Turn on the lights when you go into a dark area. Replace any light bulbs as soon as they burn out.  Set up your furniture so you have a clear path. Avoid moving your furniture around.  If any of your floors are uneven, fix them.  If there are any pets around you, be aware of where they are.  Review your medicines with your doctor. Some medicines can make you feel dizzy. This can increase your chance of falling. Ask your doctor what other things that you can do to help prevent falls. This information is not intended to replace advice given to you by your health care provider. Make sure you discuss any questions you have with your health care provider. Document Released: 09/05/2009 Document Revised: 04/16/2016 Document Reviewed: 12/14/2014 Elsevier Interactive Patient Education  2017 [employer].

## 2021-01-16 ENCOUNTER — Other Ambulatory Visit: Payer: Self-pay

## 2021-01-16 ENCOUNTER — Encounter: Payer: Self-pay | Admitting: Family Medicine

## 2021-01-16 ENCOUNTER — Ambulatory Visit (INDEPENDENT_AMBULATORY_CARE_PROVIDER_SITE_OTHER): Payer: PPO | Admitting: Family Medicine

## 2021-01-16 VITALS — BP 173/76 | HR 70 | Temp 98.4°F | Ht 69.0 in | Wt 182.0 lb

## 2021-01-16 DIAGNOSIS — R739 Hyperglycemia, unspecified: Secondary | ICD-10-CM

## 2021-01-16 DIAGNOSIS — R300 Dysuria: Secondary | ICD-10-CM | POA: Diagnosis not present

## 2021-01-16 DIAGNOSIS — E7800 Pure hypercholesterolemia, unspecified: Secondary | ICD-10-CM | POA: Diagnosis not present

## 2021-01-16 DIAGNOSIS — I10 Essential (primary) hypertension: Secondary | ICD-10-CM | POA: Diagnosis not present

## 2021-01-16 LAB — POCT URINALYSIS DIPSTICK
Appearance: NORMAL
Bilirubin, UA: NEGATIVE
Blood, UA: NEGATIVE
Glucose, UA: NEGATIVE
Ketones, UA: NEGATIVE
Leukocytes, UA: NEGATIVE
Nitrite, UA: NEGATIVE
Odor: NORMAL
Protein, UA: NEGATIVE
Spec Grav, UA: 1.015 (ref 1.010–1.025)
Urobilinogen, UA: 0.2 E.U./dL
pH, UA: 6 (ref 5.0–8.0)

## 2021-01-16 MED ORDER — OXYBUTYNIN CHLORIDE 5 MG PO TABS: 5.0000 mg | ORAL_TABLET | Freq: Two times a day (BID) | ORAL | 3 refills | Status: DC

## 2021-01-16 NOTE — Progress Notes (Signed)
Established patient visit   Patient: Rita Cruz   DOB: 20-Jun-1941   80 y.o. Female  MRN: [id number] Visit Date: 01/16/2021  Today's healthcare provider: [name], MD   Chief Complaint  Patient presents with  . Hypertension  . Hyperlipidemia   Subjective    HPI  Hypertension, follow-up  BP Readings from Last 3 Encounters:  01/16/21 (!) 173/76  07/25/20 124/78  04/05/20 140/70   Wt Readings from Last 3 Encounters:  01/16/21 182 lb (82.6 kg)  07/25/20 177 lb 12.8 oz (80.6 kg)  04/05/20 181 lb (82.1 kg)     She was last seen for hypertension 5 months ago.  BP at that visit was 124/78. Management since that visit includes continue current medications.  She reports excellent compliance with treatment. She is not having side effects.  She is following a Regular diet. She is exercising. She does not smoke.  Use of agents associated with hypertension: none.   Outside blood pressures are N/A. Symptoms: No chest pain No chest pressure  No palpitations - has murmur No syncope  No dyspnea No orthopnea  No paroxysmal nocturnal dyspnea No lower extremity edema   Pertinent labs: Lab Results  Component Value Date   CHOL 122 01/16/2021   HDL 43 01/16/2021   LDLCALC 57 01/16/2021   TRIG 124 01/16/2021   CHOLHDL 2.8 01/16/2021   Lab Results  Component Value Date   NA 143 01/16/2021   K 4.2 01/16/2021   CREATININE 1.02 (H) 01/16/2021   GFRNONAA 53 (L) 01/16/2021   GFRAA 61 01/16/2021   GLUCOSE 98 01/16/2021     The ASCVD Risk score (Goff DC Jr., et al., 2013) failed to calculate for the following reasons:   The valid total cholesterol range is 130 to 320 mg/dL   ---------------------------------------------------------------------------------------------------  Lipid/Cholesterol, Follow-up  Last lipid panel Other pertinent labs  Lab Results  Component Value Date   CHOL 122 01/16/2021   HDL 43 01/16/2021   LDLCALC 57 01/16/2021   TRIG  124 01/16/2021   CHOLHDL 2.8 01/16/2021   Lab Results  Component Value Date   ALT 15 01/16/2021   AST 18 01/16/2021   PLT 230 01/23/2020   TSH 1.240 01/23/2020     She was last seen for this 5 months ago.  Management since that visit includes continue current medications.  She reports excellent compliance with treatment. She is not having side effects.   Symptoms: No chest pain No chest pressure/discomfort  No dyspnea No lower extremity edema  No numbness or tingling of extremity No orthopnea  No palpitations No paroxysmal nocturnal dyspnea  No speech difficulty No syncope   Current diet: in general, a "healthy" diet   Current exercise: walking  The ASCVD Risk score (Weston., et al., 2013) failed to calculate for the following reasons:   The valid total cholesterol range is 130 to 320 mg/dL  ---------------------------------------------------------------------------------------------------   Social History   Tobacco Use  . Smoking status: Never Smoker  . Smokeless tobacco: Never Used  . Tobacco comment: does not smoke  Vaping Use  . Vaping Use: Never used  Substance Use Topics  . Alcohol use: Yes    Alcohol/week: 7.0 standard drinks    Types: 7 Glasses of wine per week    Comment: 1 glass of wine nightly  . Drug use: No        Medications: Outpatient Medications Prior to Visit  Medication Sig  . acetaminophen (TYLENOL)  325 MG tablet Take 650 mg by mouth every 6 (six) hours as needed for moderate pain.  Marland Kitchen ALPRAZolam (XANAX) 0.5 MG tablet Take 1 tablet (0.5 mg total) by mouth every 12 (twelve) hours as needed.  Marland Kitchen ascorbic Acid (VITAMIN C) 500 MG CPCR Take 500 mg by mouth daily.   Marland Kitchen aspirin 81 MG EC tablet Take 81 mg by mouth at bedtime.   Marland Kitchen atorvastatin (LIPITOR) 10 MG tablet Take 1 tablet (10 mg total) by mouth daily.  . Calcium Carbonate (CALCIUM 600 PO) Take 600 mg by mouth daily at 6 (six) AM.  . co-enzyme Q-10 30 MG capsule Take by mouth daily. Unsure  dose  . conjugated estrogens (PREMARIN) vaginal cream Place 1 Applicatorful vaginally 2 (two) times a week.  . fluticasone (FLONASE) 50 MCG/ACT nasal spray Place 2 sprays into both nostrils daily as needed for allergies.  Marland Kitchen GENTEAL TEARS 0.1-0.3 % SOLN as needed.   Marland Kitchen ketotifen (ZADITOR) 0.025 % ophthalmic solution Place 1 drop into both eyes daily as needed (allergies).  . losartan (COZAAR) 50 MG tablet Take 1.5 tablets (75 mg total) by mouth daily.  . Multiple Vitamins-Minerals (PRESERVISION AREDS 2 PO) Take 1 tablet by mouth 2 (two) times daily.  Marland Kitchen omeprazole (PRILOSEC) 20 MG capsule Take 1 capsule (20 mg total) by mouth daily.  . sertraline (ZOLOFT) 100 MG tablet Take 1 tablet (100 mg total) by mouth daily.  . [DISCONTINUED] oxybutynin (DITROPAN-XL) 5 MG 24 hr tablet Take 1 tablet (5 mg total) by mouth at bedtime. (Patient not taking: Reported on 01/16/2021)   No facility-administered medications prior to visit.    Review of Systems -per HPI     Objective    BP (!) 173/76 (BP Location: Left Arm, Patient Position: Sitting, Cuff Size: Large)   Pulse 70   Temp 98.4 F (36.9 C) (Oral)   Ht 5\' 9"  (1.753 m)   Wt 182 lb (82.6 kg)   SpO2 98%   BMI 26.88 kg/m     Physical Exam Vitals reviewed.  Constitutional:      General: She is not in acute distress.    Appearance: Normal appearance. She is well-developed. She is not diaphoretic.  HENT:     Head: Normocephalic and atraumatic.  Eyes:     General: No scleral icterus.    Conjunctiva/sclera: Conjunctivae normal.  Neck:     Thyroid: No thyromegaly.  Cardiovascular:     Rate and Rhythm: Normal rate and regular rhythm.     Pulses: Normal pulses.     Heart sounds: Normal heart sounds. No murmur heard.   Pulmonary:     Effort: Pulmonary effort is normal. No respiratory distress.     Breath sounds: Normal breath sounds. No wheezing, rhonchi or rales.  Musculoskeletal:     Cervical back: Neck supple.     Right lower leg: No  edema.     Left lower leg: No edema.  Lymphadenopathy:     Cervical: No cervical adenopathy.  Skin:    General: Skin is warm and dry.     Findings: No rash.  Neurological:     Mental Status: She is alert and oriented to person, place, and time. Mental status is at baseline.  Psychiatric:        Mood and Affect: Mood normal.        Behavior: Behavior normal.       Results for orders placed or performed in visit on 01/16/21  Hemoglobin A1c  Result Value  Ref Range   Hgb A1c MFr Bld 5.4 4.8 - 5.6 %   Est. average glucose Bld gHb Est-mCnc 108 mg/dL  Lipid panel  Result Value Ref Range   Cholesterol, Total 122 100 - 199 mg/dL   Triglycerides 124 0 - 149 mg/dL   HDL 43 >39 mg/dL   VLDL Cholesterol Cal 22 5 - 40 mg/dL   LDL Chol Calc (NIH) 57 0 - 99 mg/dL   Chol/HDL Ratio 2.8 0.0 - 4.4 ratio  Comprehensive metabolic panel  Result Value Ref Range   Glucose 98 65 - 99 mg/dL   BUN 9 8 - 27 mg/dL   Creatinine, Ser 1.02 (H) 0.57 - 1.00 mg/dL   GFR calc non Af Amer 53 (L) >59 mL/min/1.73   GFR calc Af Amer 61 >59 mL/min/1.73   BUN/Creatinine Ratio 9 (L) 12 - 28   Sodium 143 134 - 144 mmol/L   Potassium 4.2 3.5 - 5.2 mmol/L   Chloride 105 96 - 106 mmol/L   CO2 22 20 - 29 mmol/L   Calcium 9.4 8.7 - 10.3 mg/dL   Total Protein 6.8 6.0 - 8.5 g/dL   Albumin 4.5 3.7 - 4.7 g/dL   Globulin, Total 2.3 1.5 - 4.5 g/dL   Albumin/Globulin Ratio 2.0 1.2 - 2.2   Bilirubin Total 0.6 0.0 - 1.2 mg/dL   Alkaline Phosphatase 79 44 - 121 IU/L   AST 18 0 - 40 IU/L   ALT 15 0 - 32 IU/L  POCT Urinalysis Dipstick  Result Value Ref Range   Color, UA yellow    Clarity, UA clear    Glucose, UA Negative Negative   Bilirubin, UA Negative    Ketones, UA Negative    Spec Grav, UA 1.015 1.010 - 1.025   Blood, UA Negative    pH, UA 6.0 5.0 - 8.0   Protein, UA Negative Negative   Urobilinogen, UA 0.2 0.2 or 1.0 E.U./dL   Nitrite, UA Negative    Leukocytes, UA Negative Negative   Appearance normal     Odor normal     Assessment & Plan     Problem List Items Addressed This Visit      Cardiovascular and Mediastinum   Essential (primary) hypertension - Primary    Blood pressure elevated today, but typically well controlled Patient states that stress is worsening this currently and home blood pressures are well controlled No changes to medications today Continue to monitor home blood pressure Recheck metabolic panel       Relevant Orders   Comprehensive metabolic panel (Completed)     Other   Hypercholesteremia    Previously well controlled Continue statin Continue coq.10 to prevent myalgias Repeat FLP and CMP Goal LDL < 70      Relevant Orders   Lipid panel (Completed)   Comprehensive metabolic panel (Completed)   Blood glucose elevated    Recommend low carb diet Recheck A1c       Relevant Orders   Hemoglobin A1c (Completed)    Other Visit Diagnoses    Burning with urination       Relevant Orders   POCT Urinalysis Dipstick (Completed)    - UA benign - reassurance    Return in about 6 months (around 07/16/2021) for CPE.      I, [name], MD, have reviewed all documentation for this visit. The documentation on 01/17/21 for the exam, diagnosis, procedures, and orders are all accurate and complete.   Virginia Crews, MD, MPH  [facility]

## 2021-01-17 LAB — COMPREHENSIVE METABOLIC PANEL
ALT: 15 IU/L (ref 0–32)
AST: 18 IU/L (ref 0–40)
Albumin/Globulin Ratio: 2 (ref 1.2–2.2)
Albumin: 4.5 g/dL (ref 3.7–4.7)
Alkaline Phosphatase: 79 IU/L (ref 44–121)
BUN/Creatinine Ratio: 9 — ABNORMAL LOW (ref 12–28)
BUN: 9 mg/dL (ref 8–27)
Bilirubin Total: 0.6 mg/dL (ref 0.0–1.2)
CO2: 22 mmol/L (ref 20–29)
Calcium: 9.4 mg/dL (ref 8.7–10.3)
Chloride: 105 mmol/L (ref 96–106)
Creatinine, Ser: 1.02 mg/dL — ABNORMAL HIGH (ref 0.57–1.00)
GFR calc Af Amer: 61 mL/min/{1.73_m2} (ref >59)
GFR calc non Af Amer: 53 mL/min/{1.73_m2} — ABNORMAL LOW (ref >59)
Globulin, Total: 2.3 g/dL (ref 1.5–4.5)
Glucose: 98 mg/dL (ref 65–99)
Potassium: 4.2 mmol/L (ref 3.5–5.2)
Sodium: 143 mmol/L (ref 134–144)
Total Protein: 6.8 g/dL (ref 6.0–8.5)

## 2021-01-17 LAB — HEMOGLOBIN A1C
Est. average glucose Bld gHb Est-mCnc: 108 mg/dL
Hgb A1c MFr Bld: 5.4 % (ref 4.8–5.6)

## 2021-01-17 LAB — LIPID PANEL
Chol/HDL Ratio: 2.8 ratio (ref 0.0–4.4)
Cholesterol, Total: 122 mg/dL (ref 100–199)
HDL: 43 mg/dL (ref >39)
LDL Chol Calc (NIH): 57 mg/dL (ref 0–99)
Triglycerides: 124 mg/dL (ref 0–149)
VLDL Cholesterol Cal: 22 mg/dL (ref 5–40)

## 2021-01-17 NOTE — Assessment & Plan Note (Signed)
Previously well controlled Continue statin Continue coq.10 to prevent myalgias Repeat FLP and CMP Goal LDL < 70

## 2021-01-17 NOTE — Assessment & Plan Note (Signed)
Recommend low carb diet °Recheck A1c  °

## 2021-01-17 NOTE — Assessment & Plan Note (Signed)
Blood pressure elevated today, but typically well controlled Patient states that stress is worsening this currently and home blood pressures are well controlled No changes to medications today Continue to monitor home blood pressure Recheck metabolic panel

## 2021-01-23 ENCOUNTER — Telehealth: Payer: Self-pay | Admitting: *Deleted

## 2021-01-23 ENCOUNTER — Ambulatory Visit: Payer: Self-pay | Admitting: Family Medicine

## 2021-01-23 NOTE — Telephone Encounter (Signed)
Patient returned call for results and notified per lab notes: Normal labs

## 2021-01-29 ENCOUNTER — Other Ambulatory Visit: Payer: Self-pay

## 2021-01-29 NOTE — Telephone Encounter (Signed)
Copied from [place] [id number]. Topic: General - Other >> [name] 29, 2021 10:24 AM Keene Breath wrote: Reason for CRM: Patient would like the nurse to call her to possibly get a script for an antibiotic or something for her sinus infection called in.  [place] to the pharmacy where she is at.  She also would like to speak with the nurse regarding another medication.  Please call patient to discuss further at [phone]

## 2021-01-30 NOTE — Telephone Encounter (Signed)
Patient called in to inform Dr B that she is down at [facility]  waiting to come to [facility] for an apartment to be ready and need an Rx for sertraline (ZOLOFT) 100 MG tablet also say that she may have a sinus infection and need a Z pack called in also can be reached at Ph#  [phone]  [place], [place] - [address] Phone:  [phone]  Fax:  [phone]

## 2021-01-31 MED ORDER — SERTRALINE HCL 100 MG PO TABS
100.0000 mg | ORAL_TABLET | Freq: Every day | ORAL | 3 refills | Status: DC
Start: 2021-01-31 — End: 2021-07-21

## 2021-03-12 ENCOUNTER — Other Ambulatory Visit: Payer: Self-pay | Admitting: Family Medicine

## 2021-03-12 DIAGNOSIS — F418 Other specified anxiety disorders: Secondary | ICD-10-CM

## 2021-03-12 NOTE — Telephone Encounter (Signed)
Requested medication (s) are due for refill today:no  Requested medication (s) are on the active medication list: yes   Last refill:  09/02/2020  Future visit scheduled: yes   Notes to clinic:  this refill cannot be delegated    Requested Prescriptions  Pending Prescriptions Disp Refills   ALPRAZolam (XANAX) 0.5 MG tablet [Pharmacy Med Name: ALPRAZolam 0.5 MG Oral Tablet] 60 tablet 0    Sig: TAKE 1 TABLET BY MOUTH EVERY 12 HOURS AS NEEDED (MUST  LAST  30  DAYS)      Not Delegated - Psychiatry:  Anxiolytics/Hypnotics Failed - 03/12/2021  9:52 AM      Failed - This refill cannot be delegated      Failed - Urine Drug Screen completed in last 360 days      Passed - Valid encounter within last 6 months    Recent Outpatient Visits           1 month ago Essential (primary) hypertension   [facility], Dionne Bucy, MD   7 months ago Essential (primary) hypertension   [facility], Dionne Bucy, MD   1 year ago Encounter for annual physical exam   [facility] [place], Dionne Bucy, MD   1 year ago Need for influenza vaccination   [facility] [place], Dionne Bucy, MD   1 year ago Essential (primary) hypertension   [facility] [place], Dionne Bucy, MD       Future Appointments             In 4 months [place], Dionne Bucy, MD [facility], [place]

## 2021-03-12 NOTE — Telephone Encounter (Signed)
Patient called wanting to check on this refill request. She states she really needs this prescription. She has 2-3 pills left. It was last sent in to a pharmacy at [facility] [place]. Patient says the pharmacy will not transfer prescription to [facility] in [place]. Patient is requesting that we send a new prescription to [facility] on [address].

## 2021-03-16 NOTE — Progress Notes (Signed)
Appointment cancelled

## 2021-04-14 ENCOUNTER — Other Ambulatory Visit: Payer: Self-pay | Admitting: Family Medicine

## 2021-04-14 DIAGNOSIS — F418 Other specified anxiety disorders: Secondary | ICD-10-CM

## 2021-04-14 NOTE — Telephone Encounter (Signed)
Requested medication (s) are due for refill today:   Provider to review  Requested medication (s) are on the active medication list:   Yes  Future visit scheduled:   Yes   Last ordered: 03/13/2021 #60, 0 refills  Non delegated refill    Requested Prescriptions  Pending Prescriptions Disp Refills   ALPRAZolam (XANAX) 0.5 MG tablet [Pharmacy Med Name: ALPRAZolam 0.5 MG Oral Tablet] 60 tablet 0    Sig: TAKE 1 TABLET BY MOUTH EVERY 12 HOURS AS NEEDED MUST  LAST  33  DAYS      Not Delegated - Psychiatry:  Anxiolytics/Hypnotics Failed - 04/14/2021  5:31 AM      Failed - This refill cannot be delegated      Failed - Urine Drug Screen completed in last 360 days      Passed - Valid encounter within last 6 months    Recent Outpatient Visits           2 months ago Essential (primary) hypertension   [facility], Dionne Bucy, MD   8 months ago Essential (primary) hypertension   [facility], Dionne Bucy, MD   1 year ago Encounter for annual physical exam   [facility] [place], Dionne Bucy, MD   1 year ago Need for influenza vaccination   [facility] [place], Dionne Bucy, MD   1 year ago Essential (primary) hypertension   [facility] [place], Dionne Bucy, MD       Future Appointments             In 3 months Bacigalupo, Dionne Bucy, MD [facility], [place]

## 2021-04-29 DIAGNOSIS — Z86018 Personal history of other benign neoplasm: Secondary | ICD-10-CM | POA: Diagnosis not present

## 2021-04-29 DIAGNOSIS — L821 Other seborrheic keratosis: Secondary | ICD-10-CM | POA: Diagnosis not present

## 2021-04-29 DIAGNOSIS — L578 Other skin changes due to chronic exposure to nonionizing radiation: Secondary | ICD-10-CM | POA: Diagnosis not present

## 2021-04-29 DIAGNOSIS — Z872 Personal history of diseases of the skin and subcutaneous tissue: Secondary | ICD-10-CM | POA: Diagnosis not present

## 2021-05-16 ENCOUNTER — Other Ambulatory Visit: Payer: Self-pay | Admitting: Family Medicine

## 2021-05-21 DIAGNOSIS — M545 Low back pain, unspecified: Secondary | ICD-10-CM | POA: Diagnosis not present

## 2021-05-21 DIAGNOSIS — M79604 Pain in right leg: Secondary | ICD-10-CM | POA: Diagnosis not present

## 2021-05-21 DIAGNOSIS — M5416 Radiculopathy, lumbar region: Secondary | ICD-10-CM | POA: Diagnosis not present

## 2021-05-21 DIAGNOSIS — M5136 Other intervertebral disc degeneration, lumbar region: Secondary | ICD-10-CM | POA: Diagnosis not present

## 2021-05-21 DIAGNOSIS — M1611 Unilateral primary osteoarthritis, right hip: Secondary | ICD-10-CM | POA: Diagnosis not present

## 2021-05-21 DIAGNOSIS — M47816 Spondylosis without myelopathy or radiculopathy, lumbar region: Secondary | ICD-10-CM | POA: Diagnosis not present

## 2021-05-23 ENCOUNTER — Other Ambulatory Visit: Payer: Self-pay | Admitting: Physical Medicine and Rehabilitation

## 2021-05-23 DIAGNOSIS — R937 Abnormal findings on diagnostic imaging of other parts of musculoskeletal system: Secondary | ICD-10-CM

## 2021-06-03 ENCOUNTER — Other Ambulatory Visit: Payer: Self-pay

## 2021-06-03 ENCOUNTER — Ambulatory Visit
Admission: RE | Admit: 2021-06-03 | Discharge: 2021-06-03 | Disposition: A | Discharge status: Home / Self Care | Payer: PPO | Source: Ambulatory Visit | Attending: Physical Medicine and Rehabilitation | Admitting: Physical Medicine and Rehabilitation

## 2021-06-03 DIAGNOSIS — R935 Abnormal findings on diagnostic imaging of other abdominal regions, including retroperitoneum: Secondary | ICD-10-CM | POA: Diagnosis not present

## 2021-06-03 DIAGNOSIS — R937 Abnormal findings on diagnostic imaging of other parts of musculoskeletal system: Secondary | ICD-10-CM | POA: Insufficient documentation

## 2021-06-03 DIAGNOSIS — K5790 Diverticulosis of intestine, part unspecified, without perforation or abscess without bleeding: Secondary | ICD-10-CM | POA: Diagnosis not present

## 2021-06-03 DIAGNOSIS — Z90710 Acquired absence of both cervix and uterus: Secondary | ICD-10-CM | POA: Diagnosis not present

## 2021-06-03 DIAGNOSIS — M1611 Unilateral primary osteoarthritis, right hip: Secondary | ICD-10-CM | POA: Diagnosis not present

## 2021-06-10 DIAGNOSIS — M1611 Unilateral primary osteoarthritis, right hip: Secondary | ICD-10-CM | POA: Diagnosis not present

## 2021-07-21 ENCOUNTER — Encounter: Payer: Self-pay | Admitting: Family Medicine

## 2021-07-21 ENCOUNTER — Other Ambulatory Visit: Payer: Self-pay

## 2021-07-21 ENCOUNTER — Ambulatory Visit (INDEPENDENT_AMBULATORY_CARE_PROVIDER_SITE_OTHER): Payer: PPO | Admitting: Family Medicine

## 2021-07-21 VITALS — BP 174/98 | HR 83 | Temp 98.4°F | Resp 16 | Ht 68.0 in | Wt 178.3 lb

## 2021-07-21 DIAGNOSIS — R194 Change in bowel habit: Secondary | ICD-10-CM | POA: Diagnosis not present

## 2021-07-21 DIAGNOSIS — Z1231 Encounter for screening mammogram for malignant neoplasm of breast: Secondary | ICD-10-CM

## 2021-07-21 DIAGNOSIS — M5416 Radiculopathy, lumbar region: Secondary | ICD-10-CM | POA: Diagnosis not present

## 2021-07-21 DIAGNOSIS — Z0000 Encounter for general adult medical examination without abnormal findings: Secondary | ICD-10-CM | POA: Diagnosis not present

## 2021-07-21 DIAGNOSIS — F331 Major depressive disorder, recurrent, moderate: Secondary | ICD-10-CM

## 2021-07-21 DIAGNOSIS — E663 Overweight: Secondary | ICD-10-CM

## 2021-07-21 DIAGNOSIS — F411 Generalized anxiety disorder: Secondary | ICD-10-CM

## 2021-07-21 DIAGNOSIS — M1611 Unilateral primary osteoarthritis, right hip: Secondary | ICD-10-CM | POA: Diagnosis not present

## 2021-07-21 DIAGNOSIS — I10 Essential (primary) hypertension: Secondary | ICD-10-CM | POA: Diagnosis not present

## 2021-07-21 DIAGNOSIS — M5136 Other intervertebral disc degeneration, lumbar region: Secondary | ICD-10-CM | POA: Diagnosis not present

## 2021-07-21 DIAGNOSIS — R739 Hyperglycemia, unspecified: Secondary | ICD-10-CM

## 2021-07-21 MED ORDER — SERTRALINE HCL 100 MG PO TABS: 150.0000 mg | ORAL_TABLET | Freq: Every day | ORAL | 3 refills | Status: DC

## 2021-07-21 MED ORDER — LOSARTAN POTASSIUM 100 MG PO TABS: 100.0000 mg | ORAL_TABLET | Freq: Every day | ORAL | 1 refills | Status: DC

## 2021-07-21 NOTE — Progress Notes (Signed)
Complete physical exam   Patient: Rita Cruz   DOB: [birth year]   80 y.o. Female  MRN: [id number] Visit Date: 07/21/2021  Today's healthcare provider: [name], MD   Chief Complaint  Patient presents with   Annual Exam   Subjective    Rita Cruz is a 80 y.o. female who presents today for a complete physical exam.  She reports consuming a general diet. Home exercise routine includes walking 5 hrs per week. She generally feels well. She reports sleeping well. She does not have additional problems to discuss today.   HPI   Anxiety She has been very stressed based on familial stress. Her husbands health is on decline and she has been his caregiver. Her and her daughter's relationship is strained and it makes her tearful. She is agreeable to referral for therapy.   She takes xanax 0.5 mg 2 x daily. She is amenable to increasing the dosage of zoloft. She is tolerating both medications and in good compliance.  Her blood pressure is stress driven. She is tolerating 50 mg losartan and changes include increase dosage to 100 mg.  BP Readings from Last 3 Encounters:  07/21/21 (!) 174/98  01/16/21 (!) 173/76  07/25/20 124/78   Screenings Mammogram 08/06/20 Colonoscopy 01/30/20 She has been having diarrhea typically after eating. She has noticed trace amounts of blood and puss in her stool. She will follow with Dr. Allen Norris to discuss continuing colon cancer screening.    Vaccines She is current on vaccines.   Past Medical History:  Diagnosis Date   Adenomatous polyps    Anxiety    Arthritis    neck   Bronchitis 02/04/2018   Capsulitis of toe of right foot    Chest pain    Adenosine Myoview in 2006 done for atypical chest pain, showed no eviden e for ischemia or infarct   Colon polyps    GERD (gastroesophageal reflux disease)    History of colonic polyps    HTN (hypertension)    Hyperlipidemia    PUD (peptic ulcer disease)    with hx of H. Pylori    Past Surgical History:  Procedure Laterality Date   ABDOMINAL HYSTERECTOMY     BREAST BIOPSY Right 2011   benign mass done in Dr. [place] office   BREAST BIOPSY Right 06/29/2015   top hat marker. PROLIFERATIVE FIBROCYSTIC CHANGE WITH ASSOCIATED    CERVICAL DISCECTOMY     CHOLECYSTECTOMY     COLONOSCOPY WITH PROPOFOL N/A 10/06/2016   Procedure: COLONOSCOPY WITH PROPOFOL;  Surgeon: Lollie Sails, MD;  Location: [facility] ENDOSCOPY;  Service: Endoscopy;  Laterality: N/A;   COLONOSCOPY WITH PROPOFOL N/A 12/30/2018   Procedure: COLONOSCOPY WITH PROPOFOL;  Surgeon: Lollie Sails, MD;  Location: [facility] ENDOSCOPY;  Service: Endoscopy;  Laterality: N/A;   COLONOSCOPY WITH PROPOFOL N/A 01/30/2020   Procedure: COLONOSCOPY WITH PROPOFOL;  Surgeon: Lucilla Lame, MD;  Location: [facility] ENDOSCOPY;  Service: Endoscopy;  Laterality: N/A;   EYE SURGERY     FRACTURE SURGERY Right 05/21/2020   fell while at the beach. Proximal humerus fracture   hysterectomy-unspecified area     LAPAROSCOPIC RIGHT COLECTOMY Right 01/23/2019   Procedure: LAPAROSCOPIC RIGHT COLECTOMY;  Surgeon: Robert Bellow, MD;  Location: [facility] ORS;  Service: General;  Laterality: Right;   Social History   Socioeconomic History   Marital status: Married    Spouse name: Not on file   Number of children: 2   Years of education: Not  on file   Highest education level: Some college, no degree  Occupational History   Occupation: retired  Tobacco Use   Smoking status: Never   Smokeless tobacco: Never   Tobacco comments:    does not smoke  Vaping Use   Vaping Use: Never used  Substance and Sexual Activity   Alcohol use: Yes    Alcohol/week: 7.0 standard drinks    Types: 7 Glasses of wine per week    Comment: 1 glass of wine nightly   Drug use: No   Sexual activity: Not Currently    Birth control/protection: Surgical    Comment: Hysterectomy  Other Topics Concern   Not on file  Social History Narrative   Married-2nd marriage.  She has 2 children and her husband has 2 children.    Social Determinants of Health   Financial Resource Strain: Low Risk    Difficulty of Paying Living Expenses: Not hard at all  Food Insecurity: No Food Insecurity   Worried About Charity fundraiser in the Last Year: Never true   [place] in the Last Year: Never true  Transportation Needs: No Transportation Needs   Lack of Transportation (Medical): No   Lack of Transportation (Non-Medical): No  Physical Activity: Sufficiently Active   Days of Exercise per Week: 4 days   Minutes of Exercise per Session: 40 min  Stress: Stress Concern Present   Feeling of Stress : To some extent  Social Connections: Engineer, building services of Communication with Friends and Family: More than three times a week   Frequency of Social Gatherings with Friends and Family: More than three times a week   Attends Religious Services: More than 4 times per year   Active Member of [employer] or Organizations: Yes   Attends Music therapist: More than 4 times per year   Marital Status: Married  Human resources officer Violence: Not At Risk   Fear of Current or Ex-Partner: No   Emotionally Abused: No   Physically Abused: No   Sexually Abused: No   Family Status  Relation Name Status   Father  Deceased at age 33       CHF, diabetes   Mother  Deceased at age 83   Brother  Alive   Daughter  Alive   Son  [place]   Other niece Alive   Neg Hx  (Not Specified)   Family History  Problem Relation Age of Onset   Kidney failure Father    Heart failure Father    Diabetes Father    COPD Mother    Colon cancer Mother 64   Heart attack Mother    Ulcers Mother    Osteoporosis Mother    Macular degeneration Mother    Hypertension Brother    Irritable bowel syndrome Daughter    Colon polyps Daughter    Kidney Stones Son    Bipolar disorder Son    Melanoma Son        stage IV   Hypertension Son    Kidney cancer Other     Breast cancer Neg Hx    Allergies  Allergen Reactions   Morphine And Related Other (See Comments)    Hallucinations    Percocet  [Oxycodone-Acetaminophen] Other (See Comments)    Hallucinations Hallucinations   Levaquin [Levofloxacin In D5w] Diarrhea    Vaginal itching   Lisinopril     cough   Meloxicam  Upset stomach   Penicillins Itching   Preservision Areds 2 [Multiple Vitamins-Minerals] Nausea Only    Patient Care Team: Virginia Crews, MD as PCP - General (Family Medicine) Birder Robson, MD as Referring Physician (Ophthalmology) Sharlet Salina, MD as Referring Physician (Physical Medicine and Rehabilitation) Margaretha Sheffield, MD (Otolaryngology) Lucilla Lame, MD as Consulting Physician (Gastroenterology) Bary Castilla Forest Gleason, MD as Consulting Physician (General Surgery)   Medications: Outpatient Medications Prior to Visit  Medication Sig   acetaminophen (TYLENOL) 325 MG tablet Take 650 mg by mouth every 6 (six) hours as needed for moderate pain.   ALPRAZolam (XANAX) 0.5 MG tablet TAKE 1 TABLET BY MOUTH EVERY 12 HOURS AS NEEDED MUST  LAST  30  DAYS   ascorbic Acid (VITAMIN C) 500 MG CPCR Take 500 mg by mouth daily.    aspirin 81 MG EC tablet Take 81 mg by mouth at bedtime.    atorvastatin (LIPITOR) 10 MG tablet Take 1 tablet (10 mg total) by mouth daily.   Calcium Carbonate (CALCIUM 600 PO) Take 600 mg by mouth daily at 6 (six) AM.   co-enzyme Q-10 30 MG capsule Take by mouth daily. Unsure dose   conjugated estrogens (PREMARIN) vaginal cream Place 1 Applicatorful vaginally 2 (two) times a week.   fluticasone (FLONASE) 50 MCG/ACT nasal spray Place 2 sprays into both nostrils daily as needed for allergies.   GENTEAL TEARS 0.1-0.3 % SOLN as needed.    ketotifen (ZADITOR) 0.025 % ophthalmic solution Place 1 drop into both eyes daily as needed (allergies).   Multiple Vitamins-Minerals (PRESERVISION AREDS 2 PO) Take 1 tablet by mouth 2 (two) times daily.   omeprazole  (PRILOSEC) 20 MG capsule Take 1 capsule by mouth once daily   oxybutynin (DITROPAN) 5 MG tablet Take 1 tablet (5 mg total) by mouth 2 (two) times daily.   [DISCONTINUED] losartan (COZAAR) 50 MG tablet Take 1.5 tablets (75 mg total) by mouth daily.   [DISCONTINUED] sertraline (ZOLOFT) 100 MG tablet Take 1 tablet (100 mg total) by mouth daily.   No facility-administered medications prior to visit.    Review of Systems  Constitutional:  Positive for activity change, diaphoresis and fatigue. Negative for chills and fever.  HENT:  Negative for ear pain, sinus pressure, sinus pain and sore throat.   Eyes:  Negative for pain and visual disturbance.  Respiratory:  Negative for cough, chest tightness, shortness of breath and wheezing.   Cardiovascular:  Negative for chest pain, palpitations and leg swelling.  Gastrointestinal:  Positive for diarrhea. Negative for abdominal pain, blood in stool, nausea and vomiting.  Genitourinary:  Positive for enuresis. Negative for flank pain, frequency, pelvic pain and urgency.  Musculoskeletal:  Positive for back pain, gait problem, myalgias, neck pain and neck stiffness.  Allergic/Immunologic: Positive for environmental allergies.  Neurological:  Negative for dizziness, weakness, light-headedness, numbness and headaches.  Psychiatric/Behavioral:  Positive for agitation. The patient is nervous/anxious.      Objective    BP (!) 174/98 (BP Location: Right Arm, Patient Position: Sitting, Cuff Size: Large)   Pulse 83   Temp 98.4 F (36.9 C) (Oral)   Resp 16   Ht '5\' 8"'$  (1.727 m)   Wt 178 lb 4.8 oz (80.9 kg)   SpO2 97%   BMI 27.11 kg/m    Physical Exam Vitals reviewed.  Constitutional:      General: She is not in acute distress.    Appearance: Normal appearance. She is well-developed. She is not diaphoretic.  HENT:  Head: Normocephalic and atraumatic.     Right Ear: Tympanic membrane, ear canal and external ear normal.     Left Ear: Tympanic  membrane, ear canal and external ear normal.     Nose: Nose normal.     Mouth/Throat:     Mouth: Mucous membranes are moist.     Pharynx: Oropharynx is clear. No oropharyngeal exudate.  Eyes:     General: No scleral icterus.    Conjunctiva/sclera: Conjunctivae normal.     Pupils: Pupils are equal, round, and reactive to light.  Neck:     Thyroid: No thyromegaly.  Cardiovascular:     Rate and Rhythm: Normal rate and regular rhythm.     Pulses: Normal pulses.     Heart sounds: Normal heart sounds. No murmur heard. Pulmonary:     Effort: Pulmonary effort is normal. No respiratory distress.     Breath sounds: Normal breath sounds. No wheezing or rales.  Abdominal:     General: There is no distension.     Palpations: Abdomen is soft.     Tenderness: There is no abdominal tenderness.  Musculoskeletal:        General: No deformity.     Cervical back: Neck supple.     Right lower leg: No edema.     Left lower leg: No edema.  Lymphadenopathy:     Cervical: No cervical adenopathy.  Skin:    General: Skin is warm and dry.     Findings: No rash.  Neurological:     Mental Status: She is alert and oriented to person, place, and time. Mental status is at baseline.     Sensory: No sensory deficit.     Motor: No weakness.     Gait: Gait normal.  Psychiatric:        Mood and Affect: Mood is anxious. Affect is tearful.        Behavior: Behavior normal.        Thought Content: Thought content normal.      Last depression screening scores PHQ 2/9 Scores 07/21/2021 01/16/2021 12/31/2020  PHQ - 2 Score 2 1 0  PHQ- 9 Score 3 1 -   Last fall risk screening Fall Risk  07/21/2021  Falls in the past year? 1  Number falls in past yr: 0  Injury with Fall? 0  Risk for fall due to : History of fall(s)  Follow up Falls evaluation completed;Education provided   Last Audit-C alcohol use screening Alcohol Use Disorder Test (AUDIT) 07/21/2021  1. How often do you have a drink containing alcohol? 3   2. How many drinks containing alcohol do you have on a typical day when you are drinking? 0  3. How often do you have six or more drinks on one occasion? 0  AUDIT-C Score 3  4. How often during the last year have you found that you were not able to stop drinking once you had started? 0  5. How often during the last year have you failed to do what was normally expected from you because of drinking? 0  6. How often during the last year have you needed a first drink in the morning to get yourself going after a heavy drinking session? 0  7. How often during the last year have you had a feeling of guilt of remorse after drinking? 0  8. How often during the last year have you been unable to remember what happened the night before because you had been drinking?  0  9. Have you or someone else been injured as a result of your drinking? 0  10. Has a relative or friend or a doctor or another health worker been concerned about your drinking or suggested you cut down? 0  Alcohol Use Disorder Identification Test Final Score (AUDIT) 3  Alcohol Brief Interventions/Follow-up -   A score of 3 or more in women, and 4 or more in men indicates increased risk for alcohol abuse, EXCEPT if all of the points are from question 1   No results found for any visits on 07/21/21.  Assessment & Plan     Problem List Items Addressed This Visit       Cardiovascular and Mediastinum   Essential (primary) hypertension    Uncontrolled  Will increase losartan to '100mg'$  daily Likely stress driven, but we still need to treat      Relevant Medications   losartan (COZAAR) 100 MG tablet     Other   MDD (major depressive disorder)    Increased zoloft to '150mg'$  daily as above      Relevant Medications   sertraline (ZOLOFT) 100 MG tablet   Anxiety, generalized    Uncontrolled Under a lot of stress with family situations Increase zoloft to 150 mg daily Encourage therapy Continue Xanax sparingly - avoid increasing dose -  have discussed risks of long term benzos      Relevant Medications   sertraline (ZOLOFT) 100 MG tablet   Blood glucose elevated    Recommend low carb diet Recheck A1c      Relevant Orders   Hemoglobin A1c   Overweight    Discussed importance of healthy weight management Discussed diet and exercise       Change in bowel habits    Was told after last colonoscopy that she was done with screening for colon cancer, but wants to see GI about her changes in BMs      Relevant Orders   Ambulatory referral to Gastroenterology   Other Visit Diagnoses     Encounter for annual physical exam    -  Primary   Relevant Orders   Hemoglobin [id number]   Basic Metabolic Panel (BMET)   Encounter for screening mammogram for malignant neoplasm of breast       Relevant Orders   MM 3D SCREEN BREAST BILATERAL       Routine Health Maintenance and Physical Exam  Exercise Activities and Dietary recommendations  Goals       "I need help managing my stress" (pt-stated)      Current Barriers:  Acute Mental Health needs related to adjustment to spouse's medical condition and family conflicts  Mental Health Concerns  Suicidal Ideation/Homicidal Ideation: No  Clinical Social Work Goal(s):  Over the next 90 days, patient will work with SW bi-weekly by telephone or in person to reduce or manage symptoms related to stress management   Interventions: Status of patient's stress level discussed as well as possible triggers Confirmed that patient was doing much better due to son and daughter's surgery being a success as well as the delivery of her great grand daughter  Discussed current coping strategies used, (crafting, prayer, reaching out to friends)  exploring alternative perspectives Discussed plan to go the beach for the next month with spouse (11/30/19-12/16/19) Positive Reinforcement provided in regards to the development of a self care plan including re-arranging medical appointments in-order to extend  time at the beach Discussed plans with patient for ongoing care management follow up  and provided patient with direct contact information for care management team if needed in the future Emotional/Supportive Counseling provided related to family stress experienced allowing her to vent her frustrations and celebrate her sucessess  Patient Self Care Activities:  Performs ADL's independently Performs IADL's independently Ability for insight  Patient Coping Strengths:  [place] to Communicate Effectively  Patient Self Care Deficits:  Difficulty verbalizing plan for self care  Please see past updates related to this goal by clicking on the "Past Updates" button in the selected goal        DIET - DECREASE SODA OR JUICE INTAKE      Recommend to cut back to 1 soda a day and increase water intake to 4-6 8 oz glasses a day.       DIET - INCREASE WATER INTAKE      Recommend increasing water intake 6-8 glasses of water a day.         Immunization History  Administered Date(s) Administered   Fluad Quad(high Dose 65+) 07/21/2019, 08/08/2020   Influenza, High Dose Seasonal PF 08/25/2017, 08/13/2018   PFIZER(Purple Top)SARS-COV-2 Vaccination 01/08/2020, 02/06/2020, 08/27/2020   Pneumococcal Conjugate-13 05/17/2014   Pneumococcal Polysaccharide-23 04/11/2012   Td 04/08/2010   Tdap 04/11/2012   Zoster Recombinat (Shingrix) 06/14/2019, 09/07/2019   Zoster, Live 02/04/2009    Health Maintenance  Topic Date Due   COVID-19 Vaccine (4 - Booster for Pfizer series) 12/28/2020   INFLUENZA VACCINE  06/23/2021   TETANUS/TDAP  04/11/2022   DEXA SCAN  04/26/2024   PNA vac Low Risk Adult  Completed   Zoster Vaccines- Shingrix  Completed   HPV VACCINES  Aged Out    Discussed health benefits of physical activity, and encouraged her to engage in regular exercise appropriate for her age and condition.    Return in about 4 weeks (around  08/18/2021) for BP f/u.     I,Essence Turner,acting as a Education administrator for [name], MD.,have documented all relevant documentation on the behalf of [name], MD,as directed by  [name], MD while in the presence of [name], MD.  I, [name], MD, have reviewed all documentation for this visit. The documentation on 07/21/21 for the exam, diagnosis, procedures, and orders are all accurate and complete.   Kisha Messman, Dionne Bucy, MD, MPH [facility]

## 2021-07-21 NOTE — Assessment & Plan Note (Signed)
Uncontrolled Under a lot of stress with family situations Increase zoloft to 150 mg daily Encourage therapy Continue Xanax sparingly - avoid increasing dose - have discussed risks of long term benzos

## 2021-07-21 NOTE — Assessment & Plan Note (Signed)
Increased zoloft to '150mg'$  daily as above

## 2021-07-21 NOTE — Assessment & Plan Note (Signed)
Was told after last colonoscopy that she was done with screening for colon cancer, but wants to see GI about her changes in BMs

## 2021-07-21 NOTE — Assessment & Plan Note (Signed)
Discussed importance of healthy weight management Discussed diet and exercise  

## 2021-07-21 NOTE — Assessment & Plan Note (Signed)
Recommend low carb diet °Recheck A1c  °

## 2021-07-21 NOTE — Assessment & Plan Note (Signed)
Uncontrolled  Will increase losartan to '100mg'$  daily Likely stress driven, but we still need to treat

## 2021-07-23 DIAGNOSIS — R739 Hyperglycemia, unspecified: Secondary | ICD-10-CM | POA: Diagnosis not present

## 2021-07-23 DIAGNOSIS — Z0000 Encounter for general adult medical examination without abnormal findings: Secondary | ICD-10-CM | POA: Diagnosis not present

## 2021-07-24 LAB — BASIC METABOLIC PANEL
BUN/Creatinine Ratio: 12 (ref 12–28)
BUN: 10 mg/dL (ref 8–27)
CO2: 24 mmol/L (ref 20–29)
Calcium: 9.2 mg/dL (ref 8.7–10.3)
Chloride: 105 mmol/L (ref 96–106)
Creatinine, Ser: 0.83 mg/dL (ref 0.57–1.00)
Glucose: 85 mg/dL (ref 65–99)
Potassium: 3.8 mmol/L (ref 3.5–5.2)
Sodium: 144 mmol/L (ref 134–144)
eGFR: 72 mL/min/{1.73_m2} (ref >59)

## 2021-07-24 LAB — HEMOGLOBIN A1C
Est. average glucose Bld gHb Est-mCnc: 111 mg/dL
Hgb A1c MFr Bld: 5.5 % (ref 4.8–5.6)

## 2021-07-25 ENCOUNTER — Ambulatory Visit: Payer: Self-pay | Admitting: *Deleted

## 2021-07-25 NOTE — Telephone Encounter (Signed)
Pt given lab results per notes of Dr. Brita Romp from 07/24/21 on 07/25/21. Pt verbalized understanding.

## 2021-07-31 ENCOUNTER — Ambulatory Visit: Payer: PPO | Attending: Family Medicine | Admitting: Physical Therapy

## 2021-07-31 ENCOUNTER — Encounter: Payer: Self-pay | Admitting: Physical Therapy

## 2021-07-31 DIAGNOSIS — M25551 Pain in right hip: Secondary | ICD-10-CM

## 2021-07-31 DIAGNOSIS — M5441 Lumbago with sciatica, right side: Secondary | ICD-10-CM | POA: Diagnosis not present

## 2021-07-31 DIAGNOSIS — G8929 Other chronic pain: Secondary | ICD-10-CM | POA: Diagnosis not present

## 2021-07-31 DIAGNOSIS — M6281 Muscle weakness (generalized): Secondary | ICD-10-CM | POA: Diagnosis not present

## 2021-07-31 DIAGNOSIS — Z9181 History of falling: Secondary | ICD-10-CM

## 2021-07-31 NOTE — Therapy (Signed)
[place] [facility] [address], [place], [address] Phone: [phone]   Fax:  [phone]  Physical Therapy Evaluation  Patient Details  Name: Rita Cruz MRN: [id number] Date of Birth: 1941/03/10 Referring Provider (PT): Allene Dillon, NP  Encounter Date: 07/31/2021   PT End of Session - 07/31/21 2040     Visit Number 1    Number of Visits 24    Date for PT Re-Evaluation 10/23/21    Authorization Type [employer] reporting period from 07/31/2021    Progress Note Due on Visit 10    PT Start Time 1035    PT Stop Time 1115    PT Time Calculation (min) 40 min    Activity Tolerance Patient tolerated treatment well    Behavior During Therapy [facility] for tasks assessed/performed             Past Medical History:  Diagnosis Date   Adenomatous polyps    Anxiety    Arthritis    neck   Bronchitis 02/04/2018   Capsulitis of toe of right foot    Chest pain    Adenosine Myoview in 2006 done for atypical chest pain, showed no eviden e for ischemia or infarct   Colon polyps    GERD (gastroesophageal reflux disease)    History of colonic polyps    HTN (hypertension)    Hyperlipidemia    PUD (peptic ulcer disease)    with hx of H. Pylori    Past Surgical History:  Procedure Laterality Date   ABDOMINAL HYSTERECTOMY     BREAST BIOPSY Right 2011   benign mass done in Dr. [place] office   BREAST BIOPSY Right 06/29/2015   top hat marker. PROLIFERATIVE FIBROCYSTIC CHANGE WITH ASSOCIATED    CERVICAL DISCECTOMY     CHOLECYSTECTOMY     COLONOSCOPY WITH PROPOFOL N/A 10/06/2016   Procedure: COLONOSCOPY WITH PROPOFOL;  Surgeon: Lollie Sails, MD;  Location: [facility] ENDOSCOPY;  Service: Endoscopy;  Laterality: N/A;   COLONOSCOPY WITH PROPOFOL N/A 12/30/2018   Procedure: COLONOSCOPY WITH PROPOFOL;  Surgeon: Lollie Sails, MD;  Location: [facility] ENDOSCOPY;  Service: Endoscopy;  Laterality: N/A;    COLONOSCOPY WITH PROPOFOL N/A 01/30/2020   Procedure: COLONOSCOPY WITH PROPOFOL;  Surgeon: Lucilla Lame, MD;  Location: [facility] ENDOSCOPY;  Service: Endoscopy;  Laterality: N/A;   EYE SURGERY     FRACTURE SURGERY Right 05/21/2020   [place]. Proximal humerus fracture   hysterectomy-unspecified area     LAPAROSCOPIC RIGHT COLECTOMY Right 01/23/2019   Procedure: LAPAROSCOPIC RIGHT COLECTOMY;  Surgeon: Robert Bellow, MD;  Location: [facility] ORS;  Service: General;  Laterality: Right;    There were no vitals filed for this visit.    Subjective Assessment - 07/31/21 1055     Subjective Patient reports she has had R hip problems for the last 2-3 years for no apparent reason. Last year in June she fell and broke her proximal R humerus and has completed PT for that. In the last 6 months (maybe April) she moved into a new apartment and she fell on her right. She hurt her R arm again but also landed on her hip. She tripped over a cart behind her. Since then it has been hurting a lot more. She had an injection 06/10/2021 with 70% relief. It is still bothering her so she has been sent to PT by her doctor. She also feels she has more trouble with balance. She  feels like her right leg sort of gives way at the knee while she is walking. She does not use a cane or a walker to get around. She has had only those two falls. Pain feels sore and end of day. She stays very active. Reports pain is across low back and over right hip/glute down lateral R LE. She is having trouble with her lower back and her right leg. She feels her legs are weak and it is hard for her to get up after kneeling on the floor. Has trouble bending right knee and has pain at the back of the right LE when she bends it.    Pertinent History Patient is a 80 y.o. female who presents to outpatient physical therapy with a referral for medical diagnosis lumbar degenerative disc disease, lumbar radiculitis, osteoarthritis of right hip. This  patient's chief complaints consist of pain at the low back, right hip and lateral LE and trouble with balance, leading to the following functional deficits: difficulty with usual activities and mobility including getting up from kneeling on the floor, bending over, getting up from the chair, stairs, caring for husband, cleaning, etc.  Relevant past medical history and comorbidities include anxiety, arthritis, HTN, GERD, peptic ulcer disease, cholecystectomy, adematous polyps, capsulitis of toe of right foot, right humeral fracture surgery (05/21/2020), colectomy, cervical discectomy, osteopenia.    Limitations Lifting;House hold activities;Other (comment)   getting up from kneeling on the floor, bending over, getting up from the chair,   Diagnostic tests Pelvis MRI report 06/05/2021:   "IMPRESSION:  Negative for acute or focal bony abnormality.  Septated cystic lesion posterior to the right acetabulum is likely a  paralabral cyst indicative of a labral tear. Could also be a  ganglion cyst. There is moderate right hip osteoarthritis." Lumbar spine x-ray from [facility] clinic dated 05/21/2021 revealed minimal compression of L1 and L2 could not be can excluded diffuse osteopenia was noted bladder stones could not be excluded.  Sacral and pelvis x-rays from [facility] clinic dated 05/21/2021 radiology noted that a subtle nondisplaced fracture of the lower sacrum cannot be excluded  Right hip x-ray from [facility] clinic dated 05/21/2021 revealed bilateral degenerative changes    Patient Stated Goals Would like to strengthen legs and maybe get rid of the pain in her hip/back    Currently in Pain? No/denies    Pain Score 0-No pain   W: 10/10; B: 0/10   Pain Location Back   and down right hip and lateral right leg   Pain Orientation Right    Pain Descriptors / Indicators Sore    Pain Type Chronic pain    Pain Radiating Towards down side of right leg    Pain Onset More than a month ago    Pain Frequency Intermittent     Aggravating Factors  evenings, being tired, after daily work, stairs, getting up from floor, getting up from chair, lifting, bending, caring for husband.    Pain Relieving Factors not moving, tylenol    Effect of Pain on Daily Activities Functional Limitations: usual activities and mobility including getting up from kneeling on the floor, bending over, getting up from the chair, stairs, caring for husband, cleaning, etc.                [facility] PT Assessment - 07/31/21 1052       Assessment   Medical Diagnosis lumbar degenerative disc disease, lumbar radiculitis, osteoarthritis of right hip    Referring Provider (PT) Allene Dillon, NP  Onset Date/Surgical Date 02/21/21    Prior Therapy none for this problem prior to current episode of care      Precautions   Precautions Other (comment);Fall   osteopenia     Balance Screen   Has the patient fallen in the past 6 months Yes    How many times? 1    Has the patient had a decrease in activity level because of a fear of falling?  No    Is the patient reluctant to leave their home because of a fear of falling?  No      Home Environment   Living Environment --   no concerns about getting around home     Prior Function   Level of Independence Independent   helps care for husband who is unwell   Vocation Retired    Leisure loves to craft, cares for unwell husband, gardening, flowers, container gardening      Cognition   Overall Cognitive Status Within Functional Limits for tasks assessed             OBJECTIVE  FOTO SCORE: 46/100 (lumbar spine)  OBSERVATION/INSPECTION Posture (standing): exaggerated spinal curves, forward head, rounded shoulders. No shift.  Tremor: none Muscle bulk: generally LE mildly decreased consistent with lack of regular adequate exercise or physical activity.   Bed mobility: supine <> sit  Transfers: sit <> stand with mod I using hands and multiple attempts from 18.5 inch plinth. Significant weakness  noted.  Gait: grossly WFL for household and short community ambulation. More detailed gait analysis deferred to later date as needed.    NEUROLOGICAL  Upper Motor Neuron Screen Clonus (ankle) negative bilaterally Dermatomes L2-S2 appears equal and intact to light touch. Myotomes L2-S2 appears intact   SPINE MOTION  Lumbar AROM:  *Indicates pain Flexion: = fingers to mid shin, pulling down back of B legs, no worse after . Extension: = 50% pain across back and into B hips. Rotation: R = 80% painful mid low back, L = WFL except painful mid low back. Side Flexion: R = WFL except contralateral pain in back, L = WFL except contralateral pain in back.   PERIPHERAL JOINT MOTION (in degrees)  Passive Range of Motion (PROM) Comments: B LE appears to be [facility] for basic mobility. Some end range discomfort noted at end range hip motions. Mild limitation in B hip IR.   MUSCLE PERFORMANCE (MMT):  *Indicates pain 07/31/21 Date Date  Joint/Motion R/L R/L R/L  Hip     Flexion 4*/4+* / /  Abduction 4/4 / /  Knee     Extension 4+/5 / /  Flexion 4-/4 / /  Ankle/Foot     Dorsiflexion  4/4 / /  Great toe extension 4+/4+ / /  Eversion 5/5 / /  Comments:  pain in back with hip abduction  SPECIAL TESTS: Straight leg raise (SLR): R = positive, L = negative. FABER: R = positive for decreased ROM not pain, L = negative.  FUNCTIONAL/BALANCE TESTS: Five Time Sit to Stand (5TSTS): 19 seconds from 18.5 inch plinth with B UE support and bracing from back of knees. 2 failed attempts, and one with knees failing to fully extend.   Objective measurements completed on examination: See above findings.      PT Education - 07/31/21 2039     Education Details Exercise purpose/form. Self management techniques. Education on diagnosis, prognosis, POC, anatomy and physiology of current condition Education on HEP including handout    Person(s) Educated Patient  Methods Demonstration;Explanation;Tactile  cues;Verbal cues    Comprehension Verbalized understanding;Returned demonstration;Verbal cues required;Tactile cues required;Need further instruction              PT Short Term Goals - 07/31/21 2056       PT SHORT TERM GOAL #1   Title Be independent with initial home exercise program for self-management of symptoms.    Baseline Initial HEP provided at IE (07/31/2021);    Time 2    Period Weeks    Status New    Target Date 08/14/21               PT Long Term Goals - 07/31/21 2057       PT LONG TERM GOAL #1   Title Be independent with a long-term home exercise program for self-management of symptoms.    Baseline Initial HEP provided at IE (07/31/2021);    Time 12    Period Weeks    Status New    Target Date 10/23/21      PT LONG TERM GOAL #2   Title Demonstrate improved FOTO to equal or greater than 58 by visit #12 to demonstrate improvement in overall condition and self-reported functional ability.    Baseline 46 (07/31/2021);    Time 12    Period Weeks    Status New    Target Date 10/23/21      PT LONG TERM GOAL #3   Title Patient will complete 5 Time Sit to Stand test in equal or less than 15 seconds from 18.5 inch surface with no use of B UE to demonstrate decreased fall risk and improved functional strength for transfers and household chores.    Baseline 19 seconds from 18.5 inch plinth with B UE support and bracing from back of knees. 2 failed attempts, and one with knees failing to fully extend.    Time 12    Period Weeks    Status New    Target Date 10/23/21      PT LONG TERM GOAL #4   Title Improve B hip strength to equal or greater than 4+/5 with no increase in pain to improve patient's ability to complete  valued functional tasks such as caring for her husband, lifting, carrying with less difficulty    Baseline as low as 4/5 and painful - see objective (07/31/2021);    Time 12    Period Weeks    Status New      PT LONG TERM GOAL #5   Title Be independent  with a long-term home exercise program for self-management of symptoms.    Baseline Functional Limitations: usual activities and mobility including getting up from kneeling on the floor, bending over, getting up from the chair, stairs, caring for husband, cleaning, etc (07/31/2021);    Time 12    Period Weeks    Status New                    Plan - 07/31/21 2053     Clinical Impression Statement Patient is a 80 y.o. female referred to outpatient physical therapy with a medical diagnosis of lumbar degenerative disc disease, lumbar radiculitis, osteoarthritis of right hip who presents with signs and symptoms consistent with low back pain with right sided radiculopathy and right hip pain in the setting on generalized LE weakness. Patient presents with significant pain, ROM, balance, joint stiffness, muscle performance (strength/power/endurance) and activity tolerance impairments that are limiting ability to complete her usual activities and mobility including  getting up from kneeling on the floor, bending over, getting up from the chair, stairs, caring for husband, cleaning, etc, without difficulty. Patient has two falls with injury in the last year that are likely related to her impairments and functional limitations. She needs increased time and use of B UE with failed attempts during her 5 time sit to stand test (19 seconds from 18.5 inch plinth with B UE support and bracing from back of knees. 2 failed attempts, and one with knees failing to fully extend). She has decreased self-reported function (FOTO 46/100).    Patient will benefit from skilled physical therapy intervention to address current body structure impairments and activity limitations to improve function and work towards goals set in current POC in order to return to prior level of function or maximal functional improvement.    Personal Factors and Comorbidities Past/Current Experience;Fitness;Time since onset of  injury/illness/exacerbation;Comorbidity 3+;Age    Comorbidities Relevant past medical history and comorbidities include anxiety, arthritis, HTN, GERD, peptic ulcer disease, cholecystectomy, adematous polyps, capsulitis of toe of right foot, right humeral fracture surgery (05/21/2020), colectomy, cervical discectomy, osteopenia.    Examination-Activity Limitations Caring for Others;Bend;Bed Mobility;Squat;Stairs;Lift;Locomotion Level;Sleep;Sit    Examination-Participation Restrictions Community Activity;Shop;Laundry;Yard Work;Interpersonal Relationship   usual activities and mobility including getting up from kneeling on the floor, bending over, getting up from the chair, stairs, caring for husband, cleaning, etc.   Stability/Clinical Decision Making Stable/Uncomplicated    Clinical Decision Making Low    Rehab Potential Good    PT Frequency 2x / week    PT Duration 12 weeks    PT Treatment/Interventions ADLs/Self Care Home Management;Moist Heat;Cryotherapy;DME Instruction;Gait training;Stair training;Functional mobility training;Neuromuscular re-education;Balance training;Therapeutic exercise;Therapeutic activities;Patient/family education;Manual techniques;Dry needling;Passive range of motion;Joint Manipulations;Energy conservation    PT Next Visit Plan Updated HEP as appropriate, LE, core, and funcitonal strengthening as tolerated    PT Home Exercise Plan verbally given: low trunk rotation, 1x20, 1-2 times a day    Consulted and Agree with Plan of Care Patient             Patient will benefit from skilled therapeutic intervention in order to improve the following deficits and impairments:  Improper body mechanics, Pain, Decreased mobility, Decreased activity tolerance, Decreased endurance, Decreased range of motion, Decreased strength, Hypomobility, Impaired perceived functional ability, Decreased balance, Difficulty walking  Visit Diagnosis: Chronic bilateral low back pain with right-sided  sciatica  Pain in right hip  Muscle weakness (generalized)  History of falling     Problem List Patient Active Problem List   Diagnosis Date Noted   Change in bowel habits 07/21/2021   Personal history of colonic polyps    Polyp of descending colon    Overweight 01/24/2020   Osteopenia of multiple sites 01/24/2020   Pelvic organ prolapse quantification stage 1 cystocele 07/25/2019   Female stress incontinence 07/25/2019   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 Graylon Good, PT, DPT 07/31/21, 9:04 PM  [place]  [facility] [address], [place], [address] Phone: [phone]   Fax:  [phone]  Name: Rita Cruz MRN: [id number] Date of Birth: [birth year]

## 2021-08-05 ENCOUNTER — Ambulatory Visit: Payer: PPO | Admitting: Physical Therapy

## 2021-08-05 ENCOUNTER — Encounter: Payer: Self-pay | Admitting: Physical Therapy

## 2021-08-05 DIAGNOSIS — M6281 Muscle weakness (generalized): Secondary | ICD-10-CM

## 2021-08-05 DIAGNOSIS — Z9181 History of falling: Secondary | ICD-10-CM

## 2021-08-05 DIAGNOSIS — M5441 Lumbago with sciatica, right side: Secondary | ICD-10-CM | POA: Diagnosis not present

## 2021-08-05 DIAGNOSIS — G8929 Other chronic pain: Secondary | ICD-10-CM

## 2021-08-05 DIAGNOSIS — M25551 Pain in right hip: Secondary | ICD-10-CM

## 2021-08-05 NOTE — Therapy (Signed)
[place] [address], [place], [address] Phone: [phone]   Fax:  [phone]  Physical Therapy Treatment  Patient Details  Name: Lolanda Petrovski MRN: [id number] Date of Birth: 1941-01-09 Referring Provider (PT): Allene Dillon, NP   Encounter Date: 08/05/2021   PT End of Session - 08/05/21 1012     Visit Number 2    Number of Visits 24    Date for PT Re-Evaluation 10/23/21    Authorization Type [employer] reporting period from 07/31/2021    Progress Note Due on Visit 10    PT Start Time 0903    PT Stop Time 0945    PT Time Calculation (min) 42 min    Activity Tolerance Patient tolerated treatment well    Behavior During Therapy [facility] for tasks assessed/performed             Past Medical History:  Diagnosis Date   Adenomatous polyps    Anxiety    Arthritis    neck   Bronchitis 02/04/2018   Capsulitis of toe of right foot    Chest pain    Adenosine Myoview in 2006 done for atypical chest pain, showed no eviden e for ischemia or infarct   Colon polyps    GERD (gastroesophageal reflux disease)    History of colonic polyps    HTN (hypertension)    Hyperlipidemia    PUD (peptic ulcer disease)    with hx of H. Pylori    Past Surgical History:  Procedure Laterality Date   ABDOMINAL HYSTERECTOMY     BREAST BIOPSY Right 2011   benign mass done in Dr. [place] office   BREAST BIOPSY Right 06/29/2015   top hat marker. PROLIFERATIVE FIBROCYSTIC CHANGE WITH ASSOCIATED    CERVICAL DISCECTOMY     CHOLECYSTECTOMY     COLONOSCOPY WITH PROPOFOL N/A 10/06/2016   Procedure: COLONOSCOPY WITH PROPOFOL;  Surgeon: Lollie Sails, MD;  Location: [facility] ENDOSCOPY;  Service: Endoscopy;  Laterality: N/A;   COLONOSCOPY WITH PROPOFOL N/A 12/30/2018   Procedure: COLONOSCOPY WITH PROPOFOL;  Surgeon: Lollie Sails, MD;  Location: [facility] ENDOSCOPY;  Service: Endoscopy;  Laterality: N/A;    COLONOSCOPY WITH PROPOFOL N/A 01/30/2020   Procedure: COLONOSCOPY WITH PROPOFOL;  Surgeon: Lucilla Lame, MD;  Location: [facility] ENDOSCOPY;  Service: Endoscopy;  Laterality: N/A;   EYE SURGERY     FRACTURE SURGERY Right 05/21/2020   [place] the [place]. Proximal humerus fracture   hysterectomy-unspecified area     LAPAROSCOPIC RIGHT COLECTOMY Right 01/23/2019   Procedure: LAPAROSCOPIC RIGHT COLECTOMY;  Surgeon: Robert Bellow, MD;  Location: [facility] ORS;  Service: General;  Laterality: Right;    There were no vitals filed for this visit.   Subjective Assessment - 08/05/21 0909     Subjective Pateint reports she notices no change in her condition and continues to have "soreness" in her low back and R lateral hip. States she not feel extra sore after her IE. She has been needing to do more lifting that causes some increased soreness. Rates her pain/soreness at 5/10 today which she says is a common level of discomfort for her.    Pertinent History Patient is a 81 y.o. female who presents to outpatient physical therapy with a referral for medical diagnosis lumbar degenerative disc disease, lumbar radiculitis, osteoarthritis of right hip. This patient's chief complaints consist of pain at the low back, right hip and lateral LE and trouble with balance, leading  to the following functional deficits: difficulty with usual activities and mobility including getting up from kneeling on the floor, bending over, getting up from the chair, stairs, caring for husband, cleaning, etc.  Relevant past medical history and comorbidities include anxiety, arthritis, HTN, GERD, peptic ulcer disease, cholecystectomy, adematous polyps, capsulitis of toe of right foot, right humeral fracture surgery (05/21/2020), colectomy, cervical discectomy, osteopenia.    Limitations Lifting;House hold activities;Other (comment)   getting up from kneeling on the floor, bending over, getting up from the chair,   Diagnostic tests Pelvis MRI  report 06/05/2021:   "IMPRESSION:  Negative for acute or focal bony abnormality.  Septated cystic lesion posterior to the right acetabulum is likely a  paralabral cyst indicative of a labral tear. Could also be a  ganglion cyst. There is moderate right hip osteoarthritis." Lumbar spine x-ray from [facility] clinic dated 05/21/2021 revealed minimal compression of L1 and L2 could not be can excluded diffuse osteopenia was noted bladder stones could not be excluded.  Sacral and pelvis x-rays from [facility] clinic dated 05/21/2021 radiology noted that a subtle nondisplaced fracture of the lower sacrum cannot be excluded  Right hip x-ray from [facility] clinic dated 05/21/2021 revealed bilateral degenerative changes    Patient Stated Goals Would like to strengthen legs and maybe get rid of the pain in her hip/back    Currently in Pain? Yes    Pain Score 5     Pain Onset More than a month ago             OBJECTIVE  Observation:  B genulvalgum  Static balance:  SLS, firm surface, eyes open: R = 30 seconds (mild trendelenburg); L = 22 seconds   Peripheral PROM:  R hip IR limited to about 15 degrees R knee flexion WFL but very painful at back of leg at end range.   Muscle performance:  Prone hip extension (knee extended): R = 4/5; L = 4+/5  Special tests: Hip ER Derotation test: R = concordant back pain; L = right negative R hip FADDIR negative.   Accessory Motion:  Tender to CPA and UPA, R > L at lowest three segments of lumbar spine. Does not reproduce hip pain. Hypomobile throughout lumbar spine.  CPA to sacrum not painful.   Palpation:  Most TTP at left and right greater trochanter and left deep external rotators/posterior glutes. Tension noted in bilateral deep glute region, R > L. Sustained pressure to right deep external rotators causes paresthesia down posterior R LE. Tension there relaxes slightly to palpation after sustained pressure.  Not significantly TTP at bilateral glute med/min.     TREATMENT:   Therapeutic exercise: to centralize symptoms and improve ROM, strength, muscular endurance, and activity tolerance required for successful completion of functional activities.  - NuStep level 1 using bilateral upper and lower extremities. Seat/handle setting 10/12. For improved extremity mobility, muscular endurance, and activity tolerance; and to induce the analgesic effect of aerobic exercise, stimulate improved joint nutrition, and prepare body structures and systems for following interventions. x 6  minutes (including set up). Average SPM = 81. - sit <> stand from elevated surface 1x10 from Nustep adjusted to level 10, 2x10 from nustep adjusted to level 3 (22 inches).  - sit <> stand attempts from 18 inch chair without UE (unsuccessful), with one pillow on chair (2 successful after cuing for improved forward shift and multiple failed attempts).  - SLS for max time 2x each side (see above for results).  - hooklying  R hip ER stretch in figure 4 position (difficulty reaching R knee), 2x30 seconds.  - hooklying bridge, 2x10, with 5 second hold (temporary headache afterwards).  - seated R hip ER stretch in figure 4 position (discontinued due to difficulty reaching position).  - Education on HEP including handout   Manual therapy: to reduce pain and tissue tension, improve range of motion, neuromodulation, in order to promote improved ability to complete functional activities. - CPA and B UPA at lumbar spine grade III-IV to assess response and improve motion (see above). CPA to sacrum grade III non provocative.  - STM to bilateral lumbar paraspinals, bilateral glutes, with extra time given to tender portion of right glute with trigger point release (palpable decrease in tension).  - MMT for prone hip extension (see above).   HOME EXERCISE PROGRAM Access Code: [email or web address] URL: [email or web address] Date: 08/05/2021 Prepared by: Rosita Kea  Exercises Standing Single  Leg Stance with Counter Support - 1 x daily - 3 sets - 30 seconds hold Beginner Bridge - 1 x daily - 2 sets - 10 reps - 5 seconds hold Supine Lower Trunk Rotation - 1-2 x daily - 1 sets - 20 reps   PT Education - 08/05/21 1012     Education Details Exercise purpose/form. Self management techniques. Education on HEP including handout    Person(s) Educated Patient    Methods Explanation;Demonstration;Tactile cues;Verbal cues;Handout    Comprehension Verbalized understanding;Returned demonstration;Verbal cues required;Tactile cues required;Need further instruction              PT Short Term Goals - 07/31/21 2056       PT SHORT TERM GOAL #1   Title Be independent with initial home exercise program for self-management of symptoms.    Baseline Initial HEP provided at IE (07/31/2021);    Time 2    Period Weeks    Status New    Target Date 08/14/21               PT Long Term Goals - 07/31/21 2057       PT LONG TERM GOAL #1   Title Be independent with a long-term home exercise program for self-management of symptoms.    Baseline Initial HEP provided at IE (07/31/2021);    Time 12    Period Weeks    Status New    Target Date 10/23/21      PT LONG TERM GOAL #2   Title Demonstrate improved FOTO to equal or greater than 58 by visit #12 to demonstrate improvement in overall condition and self-reported functional ability.    Baseline 46 (07/31/2021);    Time 12    Period Weeks    Status New    Target Date 10/23/21      PT LONG TERM GOAL #3   Title Patient will complete 5 Time Sit to Stand test in equal or less than 15 seconds from 18.5 inch surface with no use of B UE to demonstrate decreased fall risk and improved functional strength for transfers and household chores.    Baseline 19 seconds from 18.5 inch plinth with B UE support and bracing from back of knees. 2 failed attempts, and one with knees failing to fully extend.    Time 12    Period Weeks    Status New    Target  Date 10/23/21      PT LONG TERM GOAL #4   Title Improve B hip strength to equal or greater than 4+/5 with no increase  in pain to improve patient's ability to complete  valued functional tasks such as caring for her husband, lifting, carrying with less difficulty    Baseline as low as 4/5 and painful - see objective (07/31/2021);    Time 12    Period Weeks    Status New      PT LONG TERM GOAL #5   Title Be independent with a long-term home exercise program for self-management of symptoms.    Baseline Functional Limitations: usual activities and mobility including getting up from kneeling on the floor, bending over, getting up from the chair, stairs, caring for husband, cleaning, etc (07/31/2021);    Time 12    Period Weeks    Status New                   Plan - 08/05/21 1011     Clinical Impression Statement Patient tolerated treatment well overall and demonstrated high level of motivation to participate and get stronger. No increase in pain by end of session. She did have temporary headache after glute bridge which should be monitored at future visits. Educated about potential DOMS and to stop bridge if she continues to get headaches that do not resolve with rest. When attempting to stand from standard chair she lacks sufficient forward shift, allows knees to come together, and must use B UE to get up, which demonstrates LE weakness that restricts functional mobility. Patient is TTP at bilateral greater trochanters and demonstrates some trendelenburg posture when standing on R hip suggesting GTPS. However, she is able to balance SLS on right hip for up to 30 seconds without pain, is not tender in glute min/med, and does have positive R SLR and TTP at R deep hip external rotators with radiation of pain down posterior R LE which is more suggestive of sciatica contributing to hip pain than GTPS. Patient will benefit from exercises targeting generalized LE strength, lateral and posterior hip  strength, and to address nerve tension. She would also benefit from proprioceptive exercises and core and functional strengthening. Patient would benefit from continued management of limiting condition by skilled physical therapist to address remaining impairments and functional limitations to work towards stated goals and return to PLOF or maximal functional independence.    Personal Factors and Comorbidities Past/Current Experience;Fitness;Time since onset of injury/illness/exacerbation;Comorbidity 3+;Age    Comorbidities Relevant past medical history and comorbidities include anxiety, arthritis, HTN, GERD, peptic ulcer disease, cholecystectomy, adematous polyps, capsulitis of toe of right foot, right humeral fracture surgery (05/21/2020), colectomy, cervical discectomy, osteopenia.    Examination-Activity Limitations Caring for Others;Bend;Bed Mobility;Squat;Stairs;Lift;Locomotion Level;Sleep;Sit    Examination-Participation Restrictions Community Activity;Shop;Laundry;Yard Work;Interpersonal Relationship   usual activities and mobility including getting up from kneeling on the floor, bending over, getting up from the chair, stairs, caring for husband, cleaning, etc.   Stability/Clinical Decision Making Stable/Uncomplicated    Rehab Potential Good    PT Frequency 2x / week    PT Duration 12 weeks    PT Treatment/Interventions ADLs/Self Care Home Management;Moist Heat;Cryotherapy;DME Instruction;Gait training;Stair training;Functional mobility training;Neuromuscular re-education;Balance training;Therapeutic exercise;Therapeutic activities;Patient/family education;Manual techniques;Dry needling;Passive range of motion;Joint Manipulations;Energy conservation    PT Next Visit Plan Updated HEP as appropriate, LE, core, and funcitonal strengthening as tolerated    PT Home Exercise Plan Medbridge Access Code: [email or web address]    Consulted and Agree with Plan of Care Patient             Patient will benefit  from skilled therapeutic intervention in order to improve  the following deficits and impairments:  Improper body mechanics, Pain, Decreased mobility, Decreased activity tolerance, Decreased endurance, Decreased range of motion, Decreased strength, Hypomobility, Impaired perceived functional ability, Decreased balance, Difficulty walking  Visit Diagnosis: Chronic bilateral low back pain with right-sided sciatica  Pain in right hip  Muscle weakness (generalized)  History of falling     Problem List Patient Active Problem List   Diagnosis Date Noted   Change in bowel habits 07/21/2021   Personal history of colonic polyps    Polyp of descending colon    Overweight 01/24/2020   Osteopenia of multiple sites 01/24/2020   Pelvic organ prolapse quantification stage 1 cystocele 07/25/2019   Female stress incontinence 07/25/2019   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 Graylon Good, PT, DPT 08/05/21, 10:14 AM   [place] [facility] [address] [place], [address] Phone: [phone]   Fax:  [phone]  Name: Symiah Socarras MRN: [id number] Date of Birth: 18-Mar-1941

## 2021-08-07 ENCOUNTER — Ambulatory Visit: Payer: PPO | Admitting: Physical Therapy

## 2021-08-07 ENCOUNTER — Encounter: Payer: Self-pay | Admitting: Physical Therapy

## 2021-08-07 DIAGNOSIS — G8929 Other chronic pain: Secondary | ICD-10-CM

## 2021-08-07 DIAGNOSIS — M5441 Lumbago with sciatica, right side: Secondary | ICD-10-CM

## 2021-08-07 DIAGNOSIS — M6281 Muscle weakness (generalized): Secondary | ICD-10-CM

## 2021-08-07 DIAGNOSIS — M25551 Pain in right hip: Secondary | ICD-10-CM

## 2021-08-07 DIAGNOSIS — Z9181 History of falling: Secondary | ICD-10-CM

## 2021-08-07 NOTE — Therapy (Signed)
[place] [facility] [address], [place], [address] Phone: [phone]   Fax:  [phone]  Physical Therapy Treatment  Patient Details  Name: Rita Cruz MRN: [id number] Date of Birth: Feb 20, 1941 Referring Provider (PT): Allene Dillon, NP   Encounter Date: 08/07/2021   PT End of Session - 08/07/21 1321     Visit Number 3    Number of Visits 24    Date for PT Re-Evaluation 10/23/21    Authorization Type [employer] reporting period from 07/31/2021    Progress Note Due on Visit 10    PT Start Time 1116    PT Stop Time 1156    PT Time Calculation (min) 40 min    Activity Tolerance Patient tolerated treatment well    Behavior During Therapy [facility] for tasks assessed/performed             Past Medical History:  Diagnosis Date   Adenomatous polyps    Anxiety    Arthritis    neck   Bronchitis 02/04/2018   Capsulitis of toe of right foot    Chest pain    Adenosine Myoview in 2006 done for atypical chest pain, showed no eviden e for ischemia or infarct   Colon polyps    GERD (gastroesophageal reflux disease)    History of colonic polyps    HTN (hypertension)    Hyperlipidemia    PUD (peptic ulcer disease)    with hx of H. Pylori    Past Surgical History:  Procedure Laterality Date   ABDOMINAL HYSTERECTOMY     BREAST BIOPSY Right 2011   benign mass done in Dr. [place] office   BREAST BIOPSY Right 06/29/2015   top hat marker. PROLIFERATIVE FIBROCYSTIC CHANGE WITH ASSOCIATED    CERVICAL DISCECTOMY     CHOLECYSTECTOMY     COLONOSCOPY WITH PROPOFOL N/A 10/06/2016   Procedure: COLONOSCOPY WITH PROPOFOL;  Surgeon: Lollie Sails, MD;  Location[facility] ENDOSCOPY;  Service: Endoscopy;  Laterality: N/A;   COLONOSCOPY WITH PROPOFOL N/A 12/30/2018   Procedure: COLONOSCOPY WITH PROPOFOL;  Surgeon: Lollie Sails, MD;  Location: [facility] ENDOSCOPY;  Service: Endoscopy;  Laterality: N/A;    COLONOSCOPY WITH PROPOFOL N/A 01/30/2020   Procedure: COLONOSCOPY WITH PROPOFOL;  Surgeon: Lucilla Lame, MD;  Location: [facility] ENDOSCOPY;  Service: Endoscopy;  Laterality: N/A;   EYE SURGERY     FRACTURE SURGERY Right 05/21/2020   [place]. Proximal humerus fracture   hysterectomy-unspecified area     LAPAROSCOPIC RIGHT COLECTOMY Right 01/23/2019   Procedure: LAPAROSCOPIC RIGHT COLECTOMY;  Surgeon: Robert Bellow, MD;  Location: [facility] ORS;  Service: General;  Laterality: Right;    There were no vitals filed for this visit.   Subjective Assessment - 08/07/21 1317     Subjective Patient is feeling pretty good. She states that her right low back pain feels a little better today, but she is experiences a little more pain in her L low back today. Patient describes pain as soreness and rates her pain as 2/10 currently. Patient reports HEP compliance.    Pertinent History Patient is a 80 y.o. female who presents to outpatient physical therapy with a referral for medical diagnosis lumbar degenerative disc disease, lumbar radiculitis, osteoarthritis of right hip. This patient's chief complaints consist of pain at the low back, right hip and lateral LE and trouble with balance, leading to the following functional deficits: difficulty with usual activities and mobility including getting  up from kneeling on the floor, bending over, getting up from the chair, stairs, caring for husband, cleaning, etc.  Relevant past medical history and comorbidities include anxiety, arthritis, HTN, GERD, peptic ulcer disease, cholecystectomy, adematous polyps, capsulitis of toe of right foot, right humeral fracture surgery (05/21/2020), colectomy, cervical discectomy, osteopenia.    Limitations Lifting;House hold activities;Other (comment)   getting up from kneeling on the floor, bending over, getting up from the chair,   Diagnostic tests Pelvis MRI report 06/05/2021:   "IMPRESSION:  Negative for acute or focal bony  abnormality.  Septated cystic lesion posterior to the right acetabulum is likely a  paralabral cyst indicative of a labral tear. Could also be a  ganglion cyst. There is moderate right hip osteoarthritis." Lumbar spine x-ray from [facility] clinic dated 05/21/2021 revealed minimal compression of L1 and L2 could not be can excluded diffuse osteopenia was noted bladder stones could not be excluded.  Sacral and pelvis x-rays from [facility] clinic dated 05/21/2021 radiology noted that a subtle nondisplaced fracture of the lower sacrum cannot be excluded  Right hip x-ray from [facility] clinic dated 05/21/2021 revealed bilateral degenerative changes    Patient Stated Goals Would like to strengthen legs and maybe get rid of the pain in her hip/back    Currently in Pain? Yes    Pain Score 2     Pain Location Back    Pain Orientation Lower    Pain Descriptors / Indicators Sore    Pain Type Chronic pain    Pain Onset More than a month ago               TREATMENT:   Manual therapy: to reduce pain and tissue tension, improve range of motion, neuromodulation, in order to promote improved ability to complete functional activities. - STM to bilateral lumbar paraspinals, bilateral glutes, with extra time given to tender portion of right glute with trigger point release (palpable decrease in tension).    Therapeutic exercise: to centralize symptoms and improve ROM, strength, muscular endurance, and activity tolerance required for successful completion of functional activities.  - NuStep level 1 using bilateral upper and lower extremities. Seat/handle setting 10/12. For improved extremity mobility, muscular endurance, and activity tolerance; and to induce the analgesic effect of aerobic exercise, stimulate improved joint nutrition, and prepare body structures and systems for following interventions. x 6  minutes (including set up). Average SPM = 81. - hooklying bridge, 2x10, with 5 second hold. Patient required cuing  to keep knees from caving in.  - supine piriformis stretch, 2x30 seconds each side.  - seated hip abduction with red resistance band at distal thigh. 1x20. Discontinued due to poor form.  - seated slump nerve glide. 2x15 each leg. Patient did not move neck on second set to minimize neck pain. Patient required cuing throughout exercise to recall sequence.  - lateral banded walking with red theraband around distal thighs for resistance 2x15 feet each direction. Patient required CGA - min assist to maintain balance during this exercise.  - SLS x 1.5 total minute each side. Patient stood next to plinth table for UE assistance to regain balance throughout minute and a half. Patient demonstrated poor glute control and balance. Patient had to regain balance and reset approximately 6 times each side.  - Education on HEP including handout    Pt required multimodal cuing for proper technique and to facilitate improved neuromuscular control, strength, range of motion, and functional ability resulting in improved performance and form.  HOME EXERCISE PROGRAM Access Code: [email or web address] URL: [email or web address] Date: 08/05/2021 Prepared by: Rosita Kea   Exercises Standing Single Leg Stance with Counter Support - 1 x daily - 3 sets - 30 seconds hold Beginner Bridge - 1 x daily - 2 sets - 10 reps - 5 seconds hold Supine Lower Trunk Rotation - 1-2 x daily - 1 sets - 20 reps       PT Education - 08/07/21 1321     Education Details Exercise purpose/form. Self management techniques    Person(s) Educated Patient    Methods Explanation;Tactile cues;Demonstration;Verbal cues    Comprehension Verbalized understanding;Returned demonstration;Verbal cues required;Tactile cues required              PT Short Term Goals - 07/31/21 2056       PT SHORT TERM GOAL #1   Title Be independent with initial home exercise program for self-management of symptoms.    Baseline Initial HEP provided at  IE (07/31/2021);    Time 2    Period Weeks    Status New    Target Date 08/14/21               PT Long Term Goals - 07/31/21 2057       PT LONG TERM GOAL #1   Title Be independent with a long-term home exercise program for self-management of symptoms.    Baseline Initial HEP provided at IE (07/31/2021);    Time 12    Period Weeks    Status New    Target Date 10/23/21      PT LONG TERM GOAL #2   Title Demonstrate improved FOTO to equal or greater than 58 by visit #12 to demonstrate improvement in overall condition and self-reported functional ability.    Baseline 46 (07/31/2021);    Time 12    Period Weeks    Status New    Target Date 10/23/21      PT LONG TERM GOAL #3   Title Patient will complete 5 Time Sit to Stand test in equal or less than 15 seconds from 18.5 inch surface with no use of B UE to demonstrate decreased fall risk and improved functional strength for transfers and household chores.    Baseline 19 seconds from 18.5 inch plinth with B UE support and bracing from back of knees. 2 failed attempts, and one with knees failing to fully extend.    Time 12    Period Weeks    Status New    Target Date 10/23/21      PT LONG TERM GOAL #4   Title Improve B hip strength to equal or greater than 4+/5 with no increase in pain to improve patient's ability to complete  valued functional tasks such as caring for her husband, lifting, carrying with less difficulty    Baseline as low as 4/5 and painful - see objective (07/31/2021);    Time 12    Period Weeks    Status New      PT LONG TERM GOAL #5   Title Be independent with a long-term home exercise program for self-management of symptoms.    Baseline Functional Limitations: usual activities and mobility including getting up from kneeling on the floor, bending over, getting up from the chair, stairs, caring for husband, cleaning, etc (07/31/2021);    Time 12    Period Weeks    Status New  Plan -  08/07/21 1335     Clinical Impression Statement Patient tolerated treatment well with no increase in pain during the session. Patient demonstrated improvements in transfer form and ability during today's session and did not require cuing to recall forward trunk shift while standing from sitting at plinth. However, patient continues to demonstrate decreased LE and hip strength which limits her functional ability. Patient weak hip abduction is noted by caving in of bilateral knees duinrg glute bridges and difficulty maintaing single leg stance. Patient will continue to benefit from skilled physical intervention to address limitations and maximize functional ability.    Personal Factors and Comorbidities Past/Current Experience;Fitness;Time since onset of injury/illness/exacerbation;Comorbidity 3+;Age    Comorbidities Relevant past medical history and comorbidities include anxiety, arthritis, HTN, GERD, peptic ulcer disease, cholecystectomy, adematous polyps, capsulitis of toe of right foot, right humeral fracture surgery (05/21/2020), colectomy, cervical discectomy, osteopenia.    Examination-Activity Limitations Caring for Others;Bend;Bed Mobility;Squat;Stairs;Lift;Locomotion Level;Sleep;Sit    Examination-Participation Restrictions Community Activity;Shop;Laundry;Yard Work;Interpersonal Relationship   usual activities and mobility including getting up from kneeling on the floor, bending over, getting up from the chair, stairs, caring for husband, cleaning, etc.   Stability/Clinical Decision Making Stable/Uncomplicated    Rehab Potential Good    PT Frequency 2x / week    PT Duration 12 weeks    PT Treatment/Interventions ADLs/Self Care Home Management;Moist Heat;Cryotherapy;DME Instruction;Gait training;Stair training;Functional mobility training;Neuromuscular re-education;Balance training;Therapeutic exercise;Therapeutic activities;Patient/family education;Manual techniques;Dry needling;Passive range of  motion;Joint Manipulations;Energy conservation    PT Next Visit Plan Updated HEP as appropriate, LE, core, and funcitonal strengthening as tolerated    PT Home Exercise Plan Medbridge Access Code: [email or web address]    Consulted and Agree with Plan of Care Patient             Patient will benefit from skilled therapeutic intervention in order to improve the following deficits and impairments:  Improper body mechanics, Pain, Decreased mobility, Decreased activity tolerance, Decreased endurance, Decreased range of motion, Decreased strength, Hypomobility, Impaired perceived functional ability, Decreased balance, Difficulty walking  Visit Diagnosis: Chronic bilateral low back pain with right-sided sciatica  Pain in right hip  Muscle weakness (generalized)  History of falling     Problem List Patient Active Problem List   Diagnosis Date Noted   Change in bowel habits 07/21/2021   Personal history of colonic polyps    Polyp of descending colon    Overweight 01/24/2020   Osteopenia of multiple sites 01/24/2020   Pelvic organ prolapse quantification stage 1 cystocele 07/25/2019   Female stress incontinence 07/25/2019   Tubulovillous adenoma polyp of colon 01/23/2019   Adenomatous polyp of ascending colon 01/18/2019   Microcalcification of both breasts on mammogram 02/04/2018   Allergic rhinitis 05/23/2015   Arthropathy, lower leg 05/23/2015   Benign neoplasm of colon 05/23/2015   MDD (major depressive disorder) 05/23/2015   Essential (primary) hypertension 05/23/2015   Acid reflux 05/23/2015   Anxiety, generalized 05/23/2015   Hypercholesteremia 05/23/2015   Blood glucose elevated 05/23/2015   Symptomatic menopausal or female climacteric states 05/23/2015   Arthritis, degenerative 05/23/2015   Mixed incontinence 03/24/2013    Sherryll Burger, SPT  Student physical therapist under direct supervision of licensed physical therapists during the entirety of the session.   Everlean Alstrom.  Graylon Good, PT, DPT 08/07/21, 4:16 PM   [facility] [address], [place], [address] Phone: [phone]   Fax:  [phone]  Name: Rita Cruz MRN: [id number] Date  of Birth: 1941-01-02

## 2021-08-12 ENCOUNTER — Encounter: Payer: PPO | Admitting: Physical Therapy

## 2021-08-18 ENCOUNTER — Other Ambulatory Visit: Payer: Self-pay | Admitting: Family Medicine

## 2021-08-18 DIAGNOSIS — F418 Other specified anxiety disorders: Secondary | ICD-10-CM

## 2021-08-18 NOTE — Telephone Encounter (Signed)
Future visit in 2 weeks  

## 2021-08-18 NOTE — Telephone Encounter (Signed)
Requested medication (s) are due for refill today: yes  Requested medication (s) are on the active medication list: yes  Last refill:  04/14/21 #60 3 refills  Future visit scheduled: yes in 2 weeks  Notes to clinic:  not delegated per protocol     Requested Prescriptions  Pending Prescriptions Disp Refills   ALPRAZolam (XANAX) 0.5 MG tablet [Pharmacy Med Name: ALPRAZolam 0.5 MG Oral Tablet] 60 tablet 0    Sig: TAKE 1 TABLET BY MOUTH EVERY 12 HOURS AS NEEDED (MUST  LAST  30  DAYS)     Not Delegated - Psychiatry:  Anxiolytics/Hypnotics Failed - 08/18/2021 11:08 AM      Failed - This refill cannot be delegated      Failed - Urine Drug Screen completed in last 360 days      Passed - Valid encounter within last 6 months    Recent Outpatient Visits           4 weeks ago Encounter for annual physical exam   [facility], Dionne Bucy, MD   7 months ago Essential (primary) hypertension   [facility], Dionne Bucy, MD   1 year ago Essential (primary) hypertension   [facility], Dionne Bucy, MD   1 year ago Encounter for annual physical exam   [facility], Dionne Bucy, MD   2 years ago Need for influenza vaccination   [facility], Dionne Bucy, MD       Future Appointments             In 2 weeks Bacig[facility], Dionne Bucy, MD [facility]            Signed Prescriptions Disp Refills   omeprazole (PRILOSEC) 20 MG capsule 90 capsule 0    Sig: Take 1 capsule by mouth once daily     Gastroenterology: Proton Pump Inhibitors Passed - 08/18/2021 11:08 AM      Passed - Valid encounter within last 12 months    Recent Outpatient Visits           4 weeks ago Encounter for annual physical exam   [facility], Dionne Bucy, MD   7 months ago Essential (primary) hypertension   [facility], Dionne Bucy, MD   1 year  ago Essential (primary) hypertension   [facility] Bacigalupo, Dionne Bucy, MD   1 year ago Encounter for annual physical exam   [facility], Dionne Bucy, MD   2 years ago Need for influenza vaccination   [facility] Bacigalupo, Dionne Bucy, MD       Future Appointments             In 2 weeks Bacigalupo, Dionne Bucy, MD [facility] [place]

## 2021-08-19 ENCOUNTER — Ambulatory Visit: Payer: PPO | Admitting: Physical Therapy

## 2021-08-19 NOTE — Telephone Encounter (Signed)
LOV: 07/21/2021

## 2021-08-20 ENCOUNTER — Other Ambulatory Visit: Payer: Self-pay

## 2021-08-20 ENCOUNTER — Ambulatory Visit
Admission: RE | Admit: 2021-08-20 | Discharge: 2021-08-20 | Disposition: A | Discharge status: Home / Self Care | Payer: PPO | Source: Ambulatory Visit | Attending: Family Medicine | Admitting: Family Medicine

## 2021-08-20 DIAGNOSIS — Z1231 Encounter for screening mammogram for malignant neoplasm of breast: Secondary | ICD-10-CM | POA: Diagnosis not present

## 2021-08-21 ENCOUNTER — Ambulatory Visit: Payer: PPO | Admitting: Physical Therapy

## 2021-08-21 ENCOUNTER — Encounter: Payer: Self-pay | Admitting: Physical Therapy

## 2021-08-21 DIAGNOSIS — M25551 Pain in right hip: Secondary | ICD-10-CM

## 2021-08-21 DIAGNOSIS — Z9181 History of falling: Secondary | ICD-10-CM

## 2021-08-21 DIAGNOSIS — G8929 Other chronic pain: Secondary | ICD-10-CM

## 2021-08-21 DIAGNOSIS — M6281 Muscle weakness (generalized): Secondary | ICD-10-CM

## 2021-08-21 DIAGNOSIS — M5441 Lumbago with sciatica, right side: Secondary | ICD-10-CM | POA: Diagnosis not present

## 2021-08-21 NOTE — Therapy (Signed)
[facility] [address] [place], [place], [address] Phone: [phone]   Fax:  [phone]  Physical Therapy Treatment  Patient Details  Name: Rita Cruz MRN: [id number] Date of Birth: 1941/08/20 Referring Provider (PT): Allene Dillon, NP   Encounter Date: 08/21/2021   PT End of Session - 08/21/21 1214     Visit Number 4    Number of Visits 24    Date for PT Re-Evaluation 10/23/21    Authorization Type [employer] reporting period from 07/31/2021    Progress Note Due on Visit 10    PT Start Time 1134    PT Stop Time 1205    PT Time Calculation (min) 31 min    Activity Tolerance Patient tolerated treatment well    Behavior During Therapy [facility] for tasks assessed/performed             Past Medical History:  Diagnosis Date   Adenomatous polyps    Anxiety    Arthritis    neck   Bronchitis 02/04/2018   Capsulitis of toe of right foot    Chest pain    Adenosine Myoview in 2006 done for atypical chest pain, showed no eviden e for ischemia or infarct   Colon polyps    GERD (gastroesophageal reflux disease)    History of colonic polyps    HTN (hypertension)    Hyperlipidemia    PUD (peptic ulcer disease)    with hx of H. Pylori    Past Surgical History:  Procedure Laterality Date   ABDOMINAL HYSTERECTOMY     BREAST BIOPSY Right 2011   benign mass done in Dr. [place] office   BREAST BIOPSY Right 06/29/2015   top hat marker. PROLIFERATIVE FIBROCYSTIC CHANGE WITH ASSOCIATED    CERVICAL DISCECTOMY     CHOLECYSTECTOMY     COLONOSCOPY WITH PROPOFOL N/A 10/06/2016   Procedure: COLONOSCOPY WITH PROPOFOL;  Surgeon: Lollie Sails, MD;  Location: [facility] ENDOSCOPY;  Service: Endoscopy;  Laterality: N/A;   COLONOSCOPY WITH PROPOFOL N/A 12/30/2018   Procedure: COLONOSCOPY WITH PROPOFOL;  Surgeon: Lollie Sails, MD;  Location: [facility] ENDOSCOPY;  Service: Endoscopy;  Laterality: N/A;    COLONOSCOPY WITH PROPOFOL N/A 01/30/2020   Procedure: COLONOSCOPY WITH PROPOFOL;  Surgeon: Lucilla Lame, MD;  Location: [facility] ENDOSCOPY;  Service: Endoscopy;  Laterality: N/A;   EYE SURGERY     FRACTURE SURGERY Right 05/21/2020   fell while at the beach. Proximal humerus fracture   hysterectomy-unspecified area     LAPAROSCOPIC RIGHT COLECTOMY Right 01/23/2019   Procedure: LAPAROSCOPIC RIGHT COLECTOMY;  Surgeon: Robert Bellow, MD;  Location: [facility] ORS;  Service: General;  Laterality: Right;    There were no vitals filed for this visit.   Subjective Assessment - 08/21/21 1134     Subjective Patient report her pain is pretty good today, rated 3/10 in her lower back and her right leg feels pretty good. She has been at the beach for a week dealing with a mold issue and did a lot of work. She also did some walking at the beach for about 3 hours. States she was so busy she did not get to her HEP much.    Pertinent History Patient is a 80 y.o. female who presents to outpatient physical therapy with a referral for medical diagnosis lumbar degenerative disc disease, lumbar radiculitis, osteoarthritis of right hip. This patient's chief complaints consist of pain at the low back, right hip and lateral LE  and trouble with balance, leading to the following functional deficits: difficulty with usual activities and mobility including getting up from kneeling on the floor, bending over, getting up from the chair, stairs, caring for husband, cleaning, etc.  Relevant past medical history and comorbidities include anxiety, arthritis, HTN, GERD, peptic ulcer disease, cholecystectomy, adematous polyps, capsulitis of toe of right foot, right humeral fracture surgery (05/21/2020), colectomy, cervical discectomy, osteopenia.    Limitations Lifting;House hold activities;Other (comment)   getting up from kneeling on the floor, bending over, getting up from the chair,   Diagnostic tests Pelvis MRI report 06/05/2021:    "IMPRESSION:  Negative for acute or focal bony abnormality.  Septated cystic lesion posterior to the right acetabulum is likely a  paralabral cyst indicative of a labral tear. Could also be a  ganglion cyst. There is moderate right hip osteoarthritis." Lumbar spine x-ray from [facility] clinic dated 05/21/2021 revealed minimal compression of L1 and L2 could not be can excluded diffuse osteopenia was noted bladder stones could not be excluded.  Sacral and pelvis x-rays from [facility] clinic dated 05/21/2021 radiology noted that a subtle nondisplaced fracture of the lower sacrum cannot be excluded  Right hip x-ray from [facility] clinic dated 05/21/2021 revealed bilateral degenerative changes    Patient Stated Goals Would like to strengthen legs and maybe get rid of the pain in her hip/back    Currently in Pain? Yes    Pain Score 3     Pain Location Back    Pain Onset More than a month ago              TREATMENT:    Manual therapy: to reduce pain and tissue tension, improve range of motion, neuromodulation, in order to promote improved ability to complete functional activities. - STM to bilateral lumbar paraspinals, bilateral glutes, with extra time given to tender portion of right glute with trigger point release (palpable decrease in tension).    Therapeutic exercise: to centralize symptoms and improve ROM, strength, muscular endurance, and activity tolerance required for successful completion of functional activities.  - hooklying bridge with bilateral hip abduction against red theraband, 1x20 with 5 second hold. Patient required cuing to keep knees from caving in.  - sit <> stand from 22 inch plinth without UE support, 1x10 (cuing for knees apart, foot position, unable to stand from lower surface with good form).  - TRX sit <> stand from 18 inch chair, 1x10, CGA for safety. Cuing for LE alignment.  - seated hamstring curl on OMEGA machine, 2x10, double legs, 25#, cuing for control.  - seated slump  nerve glide. 1x10 each leg.  - standing hip hikes standing on 2x4 board with U UE support, 2x10 each side. Max cuing for technique (responds to "like an elevator, not a swing-set") - Education on HEP including handout    Pt required multimodal cuing for proper technique and to facilitate improved neuromuscular control, strength, range of motion, and functional ability resulting in improved performance and form.      HOME EXERCISE PROGRAM Access Code: [email or web address] URL: [email or web address] Date: 08/21/2021 Prepared by: Rosita Kea  Exercises Standing Single Leg Stance with Counter Support - 1 x daily - 3 sets - 30 seconds hold Bridge with Hip Abduction and Resistance - Ground Touches - 1 x daily - 1 sets - 20 reps - 5 second hold hold Sit to Stand Without Arm Support - 1 x daily - 2-3 sets - 10 reps Supine Lower Trunk Rotation - 1-2 x  daily - 1 sets - 20 reps Supine Piriformis Stretch with Leg Straight - 1 x daily - 2 sets - 30 hold   PT Education - 08/21/21 1141     Education Details Exercise purpose/form. Self management techniques    Person(s) Educated Patient    Methods Explanation;Demonstration;Tactile cues;Verbal cues;Handout    Comprehension Verbalized understanding;Returned demonstration;Verbal cues required;Tactile cues required;Need further instruction              PT Short Term Goals - 07/31/21 2056       PT SHORT TERM GOAL #1   Title Be independent with initial home exercise program for self-management of symptoms.    Baseline Initial HEP provided at IE (07/31/2021);    Time 2    Period Weeks    Status New    Target Date 08/14/21               PT Long Term Goals - 07/31/21 2057       PT LONG TERM GOAL #1   Title Be independent with a long-term home exercise program for self-management of symptoms.    Baseline Initial HEP provided at IE (07/31/2021);    Time 12    Period Weeks    Status New    Target Date 10/23/21      PT LONG TERM GOAL  #2   Title Demonstrate improved FOTO to equal or greater than 58 by visit #12 to demonstrate improvement in overall condition and self-reported functional ability.    Baseline 46 (07/31/2021);    Time 12    Period Weeks    Status New    Target Date 10/23/21      PT LONG TERM GOAL #3   Title Patient will complete 5 Time Sit to Stand test in equal or less than 15 seconds from 18.5 inch surface with no use of B UE to demonstrate decreased fall risk and improved functional strength for transfers and household chores.    Baseline 19 seconds from 18.5 inch plinth with B UE support and bracing from back of knees. 2 failed attempts, and one with knees failing to fully extend.    Time 12    Period Weeks    Status New    Target Date 10/23/21      PT LONG TERM GOAL #4   Title Improve B hip strength to equal or greater than 4+/5 with no increase in pain to improve patient's ability to complete  valued functional tasks such as caring for her husband, lifting, carrying with less difficulty    Baseline as low as 4/5 and painful - see objective (07/31/2021);    Time 12    Period Weeks    Status New      PT LONG TERM GOAL #5   Title Be independent with a long-term home exercise program for self-management of symptoms.    Baseline Functional Limitations: usual activities and mobility including getting up from kneeling on the floor, bending over, getting up from the chair, stairs, caring for husband, cleaning, etc (07/31/2021);    Time 12    Period Weeks    Status New                   Plan - 08/21/21 1214     Clinical Impression Statement Patient arrived late so time was limited. She tolerated treatment well and reported decreased back pain by end of session. Patient was able to progress in several exercises and sets were kept  low to minimize risk of excessive DOMS. Patient demonstrates inability to stand from a normal height chair without use of BUE which compromises her functional independence.  She also demonstrates weakness in B hip abductors that may be contributing to her pain. Patient needs significant cuing for form and will benefit from review at next session to improve carry over.  Patient would benefit from continued management of limiting condition by skilled physical therapist to address remaining impairments and functional limitations to work towards stated goals and return to PLOF or maximal functional independence.    Personal Factors and Comorbidities Past/Current Experience;Fitness;Time since onset of injury/illness/exacerbation;Comorbidity 3+;Age    Comorbidities Relevant past medical history and comorbidities include anxiety, arthritis, HTN, GERD, peptic ulcer disease, cholecystectomy, adematous polyps, capsulitis of toe of right foot, right humeral fracture surgery (05/21/2020), colectomy, cervical discectomy, osteopenia.    Examination-Activity Limitations Caring for Others;Bend;Bed Mobility;Squat;Stairs;Lift;Locomotion Level;Sleep;Sit    Examination-Participation Restrictions Community Activity;Shop;Laundry;Yard Work;Interpersonal Relationship   usual activities and mobility including getting up from kneeling on the floor, bending over, getting up from the chair, stairs, caring for husband, cleaning, etc.   Stability/Clinical Decision Making Stable/Uncomplicated    Rehab Potential Good    PT Frequency 2x / week    PT Duration 12 weeks    PT Treatment/Interventions ADLs/Self Care Home Management;Moist Heat;Cryotherapy;DME Instruction;Gait training;Stair training;Functional mobility training;Neuromuscular re-education;Balance training;Therapeutic exercise;Therapeutic activities;Patient/family education;Manual techniques;Dry needling;Passive range of motion;Joint Manipulations;Energy conservation    PT Next Visit Plan Updated HEP as appropriate, LE, core, and funcitonal strengthening as tolerated    PT Home Exercise Plan Medbridge Access Code: [email or web address]    Consulted and Agree  with Plan of Care Patient             Patient will benefit from skilled therapeutic intervention in order to improve the following deficits and impairments:  Improper body mechanics, Pain, Decreased mobility, Decreased activity tolerance, Decreased endurance, Decreased range of motion, Decreased strength, Hypomobility, Impaired perceived functional ability, Decreased balance, Difficulty walking  Visit Diagnosis: Chronic bilateral low back pain with right-sided sciatica  Pain in right hip  Muscle weakness (generalized)  History of falling     Problem List Patient Active Problem List   Diagnosis Date Noted   Change in bowel habits 07/21/2021   Personal history of colonic polyps    Polyp of descending colon    Overweight 01/24/2020   Osteopenia of multiple sites 01/24/2020   Pelvic organ prolapse quantification stage 1 cystocele 07/25/2019   Female stress incontinence 07/25/2019   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 Graylon Good, PT, DPT 08/21/21, 12:15 PM   [facility] [address] [place], [address] Phone: [phone]   Fax:  [phone]  Name: Tkai Large MRN: [id number] Date of Birth: [birth year]

## 2021-08-26 ENCOUNTER — Encounter: Payer: Self-pay | Admitting: Physical Therapy

## 2021-08-26 ENCOUNTER — Ambulatory Visit: Payer: PPO | Attending: Family Medicine | Admitting: Physical Therapy

## 2021-08-26 DIAGNOSIS — M5441 Lumbago with sciatica, right side: Secondary | ICD-10-CM | POA: Insufficient documentation

## 2021-08-26 DIAGNOSIS — G8929 Other chronic pain: Secondary | ICD-10-CM | POA: Diagnosis not present

## 2021-08-26 DIAGNOSIS — Z9181 History of falling: Secondary | ICD-10-CM | POA: Insufficient documentation

## 2021-08-26 DIAGNOSIS — M25551 Pain in right hip: Secondary | ICD-10-CM | POA: Insufficient documentation

## 2021-08-26 DIAGNOSIS — M6281 Muscle weakness (generalized): Secondary | ICD-10-CM | POA: Insufficient documentation

## 2021-08-26 NOTE — Therapy (Signed)
[facility] [address], [place], [address] Phone: [phone]   Fax:  [phone]  Physical Therapy Treatment  Patient Details  Name: Rita Cruz MRN: [id number] Date of Birth: 1941-04-11 Referring Provider (PT): Allene Dillon, NP   Encounter Date: 08/26/2021   PT End of Session - 08/26/21 1301     Visit Number 5    Number of Visits 24    Date for PT Re-Evaluation 10/23/21    Authorization Type [employer] reporting period from 07/31/2021    Progress Note Due on Visit 10    PT Start Time 1035    PT Stop Time 1115    PT Time Calculation (min) 40 min    Activity Tolerance Patient tolerated treatment well    Behavior During Therapy [facility] for tasks assessed/performed             Past Medical History:  Diagnosis Date   Adenomatous polyps    Anxiety    Arthritis    neck   Bronchitis 02/04/2018   Capsulitis of toe of right foot    Chest pain    Adenosine Myoview in 2006 done for atypical chest pain, showed no eviden e for ischemia or infarct   Colon polyps    GERD (gastroesophageal reflux disease)    History of colonic polyps    HTN (hypertension)    Hyperlipidemia    PUD (peptic ulcer disease)    with hx of H. Pylori    Past Surgical History:  Procedure Laterality Date   ABDOMINAL HYSTERECTOMY     BREAST BIOPSY Right 2011   benign mass done in Dr. [place] office   BREAST BIOPSY Right 06/29/2015   top hat marker. PROLIFERATIVE FIBROCYSTIC CHANGE WITH ASSOCIATED    CERVICAL DISCECTOMY     CHOLECYSTECTOMY     COLONOSCOPY WITH PROPOFOL N/A 10/06/2016   Procedure: COLONOSCOPY WITH PROPOFOL;  Surgeon: Lollie Sails, MD;  Location: [facility] ENDOSCOPY;  Service: Endoscopy;  Laterality: N/A;   COLONOSCOPY WITH PROPOFOL N/A 12/30/2018   Procedure: COLONOSCOPY WITH PROPOFOL;  Surgeon: Lollie Sails, MD;  Location: [facility] ENDOSCOPY;  Service: Endoscopy;  Laterality: N/A;    COLONOSCOPY WITH PROPOFOL N/A 01/30/2020   Procedure: COLONOSCOPY WITH PROPOFOL;  Surgeon: Lucilla Lame, MD;  Location: [facility] ENDOSCOPY;  Service: Endoscopy;  Laterality: N/A;   EYE SURGERY     FRACTURE SURGERY Right 05/21/2020   [place]. Proximal humerus fracture   hysterectomy-unspecified area     LAPAROSCOPIC RIGHT COLECTOMY Right 01/23/2019   Procedure: LAPAROSCOPIC RIGHT COLECTOMY;  Surgeon: Robert Bellow, MD;  Location: [facility] ORS;  Service: General;  Laterality: Right;    There were no vitals filed for this visit.   Subjective Assessment - 08/26/21 1042     Subjective Patient reports she has 2/10 pain in the familar location in her low back a bit more to the right than the left. States she was sore in that location after her last PT session and after doing her HEP last night but was better again the morning after each.    Pertinent History Patient is a 80 y.o. female who presents to outpatient physical therapy with a referral for medical diagnosis lumbar degenerative disc disease, lumbar radiculitis, osteoarthritis of right hip. This patient's chief complaints consist of pain at the low back, right hip and lateral LE and trouble with balance, leading to the following functional deficits: difficulty with usual activities and mobility  including getting up from kneeling on the floor, bending over, getting up from the chair, stairs, caring for husband, cleaning, etc.  Relevant past medical history and comorbidities include anxiety, arthritis, HTN, GERD, peptic ulcer disease, cholecystectomy, adematous polyps, capsulitis of toe of right foot, right humeral fracture surgery (05/21/2020), colectomy, cervical discectomy, osteopenia.    Limitations Lifting;House hold activities;Other (comment)   getting up from kneeling on the floor, bending over, getting up from the chair,   Diagnostic tests Pelvis MRI report 06/05/2021:   "IMPRESSION:  Negative for acute or focal bony abnormality.   Septated cystic lesion posterior to the right acetabulum is likely a  paralabral cyst indicative of a labral tear. Could also be a  ganglion cyst. There is moderate right hip osteoarthritis." Lumbar spine x-ray from [facility] clinic dated 05/21/2021 revealed minimal compression of L1 and L2 could not be can excluded diffuse osteopenia was noted bladder stones could not be excluded.  Sacral and pelvis x-rays from [facility] clinic dated 05/21/2021 radiology noted that a subtle nondisplaced fracture of the lower sacrum cannot be excluded  Right hip x-ray from [facility] clinic dated 05/21/2021 revealed bilateral degenerative changes    Patient Stated Goals Would like to strengthen legs and maybe get rid of the pain in her hip/back    Currently in Pain? Yes    Pain Score 2     Pain Location Back    Pain Orientation Lower;Mid;Right    Pain Onset More than a month ago             TREATMENT:    Manual therapy: to reduce pain and tissue tension, improve range of motion, neuromodulation, in order to promote improved ability to complete functional activities. PRONE with face in table cradle, pillow under ankles.  - STM to bilateral lumbar paraspinals, bilateral glutes, with extra time given to tender portion of right glute with trigger point release (palpable decrease in tension).    Therapeutic exercise: to centralize symptoms and improve ROM, strength, muscular endurance, and activity tolerance required for successful completion of functional activities.  - hooklying bridge with bilateral hip abduction against red theraband, 1x20 with 5 second hold.  - hooklying abdominal brace with marching, 1x5 each side (reports pain, unable to perform posterior pelvic tilt).  - Hooklying posterior pelvic tilt, 5 second hold, 2x20 after additional reps to learn exercise. Cuing with visual aide of spine model. Has trouble finding movement again after break. Also used full range of anterior and posterior pelvic tilt.  -  hooklying abdominal brace with marching, 1x10 each side (no pain, needs cuing to perform PPT effectively, inconsistently able to perform correctly).  - TRX sit <> stand from 18 inch chair, 2x10, CGA for safety. Cuing for LE alignment.  - Education on HEP including handout    Pt required multimodal cuing for proper technique and to facilitate improved neuromuscular control, strength, range of motion, and functional ability resulting in improved performance and form.      HOME EXERCISE PROGRAM Access Code: [email or web address] URL: [email or web address] Date: 08/26/2021 Prepared by: Rosita Kea  Exercises Standing Single Leg Stance with Counter Support - 1 x daily - 3 sets - 30 seconds hold Supine Pelvic Tilt - 1 x daily - 20 reps Supine Posterior Pelvic Tilt - 1 x daily - 1 sets - 20 reps - 5 seconds hold Bridge with Hip Abduction and Resistance - Ground Touches - 1 x daily - 1 sets - 20 reps - 5 second hold hold Sit to Stand  Without Arm Support - 1 x daily - 2-3 sets - 10 reps Supine Piriformis Stretch with Leg Straight - 1 x daily - 2 sets - 30 hold    PT Education - 08/26/21 1301     Education Details Exercise purpose/form. Self management techniques    Person(s) Educated Patient    Methods Explanation;Demonstration;Tactile cues;Verbal cues;Handout    Comprehension Verbalized understanding;Returned demonstration;Verbal cues required;Tactile cues required;Need further instruction              PT Short Term Goals - 08/26/21 1305       PT SHORT TERM GOAL #1   Title Be independent with initial home exercise program for self-management of symptoms.    Baseline Initial HEP provided at IE (07/31/2021);    Time 2    Period Weeks    Status Achieved    Target Date 08/14/21               PT Long Term Goals - 07/31/21 2057       PT LONG TERM GOAL #1   Title Be independent with a long-term home exercise program for self-management of symptoms.    Baseline Initial HEP  provided at IE (07/31/2021);    Time 12    Period Weeks    Status New    Target Date 10/23/21      PT LONG TERM GOAL #2   Title Demonstrate improved FOTO to equal or greater than 58 by visit #12 to demonstrate improvement in overall condition and self-reported functional ability.    Baseline 46 (07/31/2021);    Time 12    Period Weeks    Status New    Target Date 10/23/21      PT LONG TERM GOAL #3   Title Patient will complete 5 Time Sit to Stand test in equal or less than 15 seconds from 18.5 inch surface with no use of B UE to demonstrate decreased fall risk and improved functional strength for transfers and household chores.    Baseline 19 seconds from 18.5 inch plinth with B UE support and bracing from back of knees. 2 failed attempts, and one with knees failing to fully extend.    Time 12    Period Weeks    Status New    Target Date 10/23/21      PT LONG TERM GOAL #4   Title Improve B hip strength to equal or greater than 4+/5 with no increase in pain to improve patient's ability to complete  valued functional tasks such as caring for her husband, lifting, carrying with less difficulty    Baseline as low as 4/5 and painful - see objective (07/31/2021);    Time 12    Period Weeks    Status New      PT LONG TERM GOAL #5   Title Be independent with a long-term home exercise program for self-management of symptoms.    Baseline Functional Limitations: usual activities and mobility including getting up from kneeling on the floor, bending over, getting up from the chair, stairs, caring for husband, cleaning, etc (07/31/2021);    Time 12    Period Weeks    Status New                   Plan - 08/26/21 1304     Clinical Impression Statement Patient tolerated treatment well overall and reported similar pain by end of session but that she felt less stiff. Patient demo great difficulty coordinating pelvic  tilt, so spent significant part of session working on awareness and motor  control for lumbopelvic region. Patient reports less back pain with supine marching with PPT than without PPT so this appears to be something important for her to learn to control to help manage her pain. Patient would benefit from continued management of limiting condition by skilled physical therapist to address remaining impairments and functional limitations to work towards stated goals and return to PLOF or maximal functional independence.    Personal Factors and Comorbidities Past/Current Experience;Fitness;Time since onset of injury/illness/exacerbation;Comorbidity 3+;Age    Comorbidities Relevant past medical history and comorbidities include anxiety, arthritis, HTN, GERD, peptic ulcer disease, cholecystectomy, adematous polyps, capsulitis of toe of right foot, right humeral fracture surgery (05/21/2020), colectomy, cervical discectomy, osteopenia.    Examination-Activity Limitations Caring for Others;Bend;Bed Mobility;Squat;Stairs;Lift;Locomotion Level;Sleep;Sit    Examination-Participation Restrictions Community Activity;Shop;Laundry;Yard Work;Interpersonal Relationship   usual activities and mobility including getting up from kneeling on the floor, bending over, getting up from the chair, stairs, caring for husband, cleaning, etc.   Stability/Clinical Decision Making Stable/Uncomplicated    Rehab Potential Good    PT Frequency 2x / week    PT Duration 12 weeks    PT Treatment/Interventions ADLs/Self Care Home Management;Moist Heat;Cryotherapy;DME Instruction;Gait training;Stair training;Functional mobility training;Neuromuscular re-education;Balance training;Therapeutic exercise;Therapeutic activities;Patient/family education;Manual techniques;Dry needling;Passive range of motion;Joint Manipulations;Energy conservation    PT Next Visit Plan Updated HEP as appropriate, LE, core, and funcitonal strengthening as tolerated    PT Home Exercise Plan Medbridge Access Code: [email or web address]    Consulted and  Agree with Plan of Care Patient             Patient will benefit from skilled therapeutic intervention in order to improve the following deficits and impairments:  Improper body mechanics, Pain, Decreased mobility, Decreased activity tolerance, Decreased endurance, Decreased range of motion, Decreased strength, Hypomobility, Impaired perceived functional ability, Decreased balance, Difficulty walking  Visit Diagnosis: Chronic bilateral low back pain with right-sided sciatica  Pain in right hip  Muscle weakness (generalized)  History of falling     Problem List Patient Active Problem List   Diagnosis Date Noted   Change in bowel habits 07/21/2021   Personal history of colonic polyps    Polyp of descending colon    Overweight 01/24/2020   Osteopenia of multiple sites 01/24/2020   Pelvic organ prolapse quantification stage 1 cystocele 07/25/2019   Female stress incontinence 07/25/2019   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 Graylon Good, PT, DPT 08/26/21, 1:05 PM   [facility] [address], [place], [address] Phone: [phone]   Fax:  [phone]  Name: Rita Cruz MRN: [id number] Date of Birth: August 22, 1941

## 2021-08-28 ENCOUNTER — Encounter: Payer: Self-pay | Admitting: Physical Therapy

## 2021-08-28 ENCOUNTER — Ambulatory Visit: Payer: PPO | Admitting: Physical Therapy

## 2021-08-28 DIAGNOSIS — G8929 Other chronic pain: Secondary | ICD-10-CM

## 2021-08-28 DIAGNOSIS — M25551 Pain in right hip: Secondary | ICD-10-CM

## 2021-08-28 DIAGNOSIS — Z9181 History of falling: Secondary | ICD-10-CM

## 2021-08-28 DIAGNOSIS — M6281 Muscle weakness (generalized): Secondary | ICD-10-CM

## 2021-08-28 DIAGNOSIS — M5441 Lumbago with sciatica, right side: Secondary | ICD-10-CM | POA: Diagnosis not present

## 2021-08-28 NOTE — Therapy (Signed)
[facility] [address], [place], [address] Phone: [phone]   Fax:  [phone]  Physical Therapy Treatment  Patient Details  Name: Rita Cruz MRN: [id number] Date of Birth: 1941/07/02 Referring Provider (PT): Allene Dillon, NP   Encounter Date: 08/28/2021   PT End of Session - 08/28/21 1122     Visit Number 6    Number of Visits 24    Date for PT Re-Evaluation 10/23/21    Authorization Type [employer] reporting period from 07/31/2021    Progress Note Due on Visit 10    PT Start Time 1120    PT Stop Time 1200    PT Time Calculation (min) 40 min    Activity Tolerance Patient tolerated treatment well    Behavior During Therapy [facility] for tasks assessed/performed             Past Medical History:  Diagnosis Date   Adenomatous polyps    Anxiety    Arthritis    neck   Bronchitis 02/04/2018   Capsulitis of toe of right foot    Chest pain    Adenosine Myoview in 2006 done for atypical chest pain, showed no eviden e for ischemia or infarct   Colon polyps    GERD (gastroesophageal reflux disease)    History of colonic polyps    HTN (hypertension)    Hyperlipidemia    PUD (peptic ulcer disease)    with hx of H. Pylori    Past Surgical History:  Procedure Laterality Date   ABDOMINAL HYSTERECTOMY     BREAST BIOPSY Right 2011   benign mass done in Dr. [place] office   BREAST BIOPSY Right 06/29/2015   top hat marker. PROLIFERATIVE FIBROCYSTIC CHANGE WITH ASSOCIATED    CERVICAL DISCECTOMY     CHOLECYSTECTOMY     COLONOSCOPY WITH PROPOFOL N/A 10/06/2016   Procedure: COLONOSCOPY WITH PROPOFOL;  Surgeon: Lollie Sails, MD;  Location: [facility] ENDOSCOPY;  Service: Endoscopy;  Laterality: N/A;   COLONOSCOPY WITH PROPOFOL N/A 12/30/2018   Procedure: COLONOSCOPY WITH PROPOFOL;  Surgeon: Lollie Sails, MD;  Location: [facility] ENDOSCOPY;  Service: Endoscopy;  Laterality: N/A;    COLONOSCOPY WITH PROPOFOL N/A 01/30/2020   Procedure: COLONOSCOPY WITH PROPOFOL;  Surgeon: Lucilla Lame, MD;  Location: [facility] ENDOSCOPY;  Service: Endoscopy;  Laterality: N/A;   EYE SURGERY     FRACTURE SURGERY Right 05/21/2020   [place]. Proximal humerus fracture   hysterectomy-unspecified area     LAPAROSCOPIC RIGHT COLECTOMY Right 01/23/2019   Procedure: LAPAROSCOPIC RIGHT COLECTOMY;  Surgeon: Robert Bellow, MD;  Location: [facility] ORS;  Service: General;  Laterality: Right;    There were no vitals filed for this visit.   Subjective Assessment - 08/28/21 1120     Subjective Pateint reports she was really sore all through her back after last PT session but today she feels like she is finally starting to feel better and her R leg bends better. States she has been working on the pelvic tilt at home with her HEP. Rates her pain 1/10 just some soreness in her back .    Pertinent History Patient is a 80 y.o. female who presents to outpatient physical therapy with a referral for medical diagnosis lumbar degenerative disc disease, lumbar radiculitis, osteoarthritis of right hip. This patient's chief complaints consist of pain at the low back, right hip and lateral LE and trouble with balance, leading to the following functional  deficits: difficulty with usual activities and mobility including getting up from kneeling on the floor, bending over, getting up from the chair, stairs, caring for husband, cleaning, etc.  Relevant past medical history and comorbidities include anxiety, arthritis, HTN, GERD, peptic ulcer disease, cholecystectomy, adematous polyps, capsulitis of toe of right foot, right humeral fracture surgery (05/21/2020), colectomy, cervical discectomy, osteopenia.    Limitations Lifting;House hold activities;Other (comment)   getting up from kneeling on the floor, bending over, getting up from the chair,   Diagnostic tests Pelvis MRI report 06/05/2021:   "IMPRESSION:  Negative for  acute or focal bony abnormality.  Septated cystic lesion posterior to the right acetabulum is likely a  paralabral cyst indicative of a labral tear. Could also be a  ganglion cyst. There is moderate right hip osteoarthritis." Lumbar spine x-ray from [facility] clinic dated 05/21/2021 revealed minimal compression of L1 and L2 could not be can excluded diffuse osteopenia was noted bladder stones could not be excluded.  Sacral and pelvis x-rays from [facility] clinic dated 05/21/2021 radiology noted that a subtle nondisplaced fracture of the lower sacrum cannot be excluded  Right hip x-ray from [facility] clinic dated 05/21/2021 revealed bilateral degenerative changes    Patient Stated Goals Would like to strengthen legs and maybe get rid of the pain in her hip/back    Currently in Pain? Yes    Pain Score 1     Pain Onset More than a month ago            OBJECTIVE  SELF-REPORTED FUNCTION FOTO score: 63/100 (lumbar spine questionnaire)   TREATMENT:   Therapeutic exercise: to centralize symptoms and improve ROM, strength, muscular endurance, and activity tolerance required for successful completion of functional activities.  - Hooklying posterior pelvic tilt (PPT), 5 second hold, 1x20 after additional reps to learn exercise. Improved carry over from last session.  - hooklying PPT with marching, 3x10 each side (no pain, needs cuing to perform PPT effectively, inconsistently able to perform correctly).  - hooklying bridge with bilateral hip abduction against GTB around distal thighs, 1x20 with 5 second hold.  - TRX sit <> stand from 18 inch chair, 3x10, CGA for safety. Cuing for LE alignment.  - lateral banded walking with green theraband around distal thighs for resistance 2x30 feet each direction. Patient required CGA to maintain balance during this exercise. (Try harder band next time) - monster walks with GTB around distal thighs, forwards and backwards, 2x30 feet each direction. CGA - min A.  - seated  knee extension at [facility] machine, 20# cable for B LE, pillow under back of thighs for comfort, 2x10 (feels strong burn in quads at end of set, needs cuing for adequate rest).    Pt required multimodal cuing for proper technique and to facilitate improved neuromuscular control, strength, range of motion, and functional ability resulting in improved performance and form.         HOME EXERCISE PROGRAM Access Code: [email or web address] URL: [email or web address] Date: 08/26/2021 Prepared by: Rosita Kea   Exercises Standing Single Leg Stance with Counter Support - 1 x daily - 3 sets - 30 seconds hold Supine Pelvic Tilt - 1 x daily - 20 reps Supine Posterior Pelvic Tilt - 1 x daily - 1 sets - 20 reps - 5 seconds hold Bridge with Hip Abduction and Resistance - Ground Touches - 1 x daily - 1 sets - 20 reps - 5 second hold hold Sit to Stand Without Arm Support - 1 x daily - 2-3  sets - 10 reps Supine Piriformis Stretch with Leg Straight - 1 x daily - 2 sets - 30 hold    PT Education - 08/28/21 1122     Education Details Exercise purpose/form. Self management techniques    Person(s) Educated Patient    Methods Explanation;Demonstration;Tactile cues;Verbal cues    Comprehension Verbalized understanding;Returned demonstration;Tactile cues required;Verbal cues required;Need further instruction              PT Short Term Goals - 08/26/21 1305       PT SHORT TERM GOAL #1   Title Be independent with initial home exercise program for self-management of symptoms.    Baseline Initial HEP provided at IE (07/31/2021);    Time 2    Period Weeks    Status Achieved    Target Date 08/14/21               PT Long Term Goals - 07/31/21 2057       PT LONG TERM GOAL #1   Title Be independent with a long-term home exercise program for self-management of symptoms.    Baseline Initial HEP provided at IE (07/31/2021);    Time 12    Period Weeks    Status New    Target Date 10/23/21      PT  LONG TERM GOAL #2   Title Demonstrate improved FOTO to equal or greater than 58 by visit #12 to demonstrate improvement in overall condition and self-reported functional ability.    Baseline 46 (07/31/2021);    Time 12    Period Weeks    Status New    Target Date 10/23/21      PT LONG TERM GOAL #3   Title Patient will complete 5 Time Sit to Stand test in equal or less than 15 seconds from 18.5 inch surface with no use of B UE to demonstrate decreased fall risk and improved functional strength for transfers and household chores.    Baseline 19 seconds from 18.5 inch plinth with B UE support and bracing from back of knees. 2 failed attempts, and one with knees failing to fully extend.    Time 12    Period Weeks    Status New    Target Date 10/23/21      PT LONG TERM GOAL #4   Title Improve B hip strength to equal or greater than 4+/5 with no increase in pain to improve patient's ability to complete  valued functional tasks such as caring for her husband, lifting, carrying with less difficulty    Baseline as low as 4/5 and painful - see objective (07/31/2021);    Time 12    Period Weeks    Status New      PT LONG TERM GOAL #5   Title Be independent with a long-term home exercise program for self-management of symptoms.    Baseline Functional Limitations: usual activities and mobility including getting up from kneeling on the floor, bending over, getting up from the chair, stairs, caring for husband, cleaning, etc (07/31/2021);    Time 12    Period Weeks    Status New                   Plan - 08/28/21 1218     Clinical Impression Statement Patient tolerated treatment well and was able to progress to more difficult exercises. Demonstrated improvement in TRX sit <> stand especially and does appear to have improvement in pain with PPT which she was  better able to control today. Does continue to report back pain and has LE weakness that prevents her from being able to stand up from a  chair without UE assistance. Patient would benefit from continued management of limiting condition by skilled physical therapist to address remaining impairments and functional limitations to work towards stated goals and return to PLOF or maximal functional independence.    Personal Factors and Comorbidities Past/Current Experience;Fitness;Time since onset of injury/illness/exacerbation;Comorbidity 3+;Age    Comorbidities Relevant past medical history and comorbidities include anxiety, arthritis, HTN, GERD, peptic ulcer disease, cholecystectomy, adematous polyps, capsulitis of toe of right foot, right humeral fracture surgery (05/21/2020), colectomy, cervical discectomy, osteopenia.    Examination-Activity Limitations Caring for Others;Bend;Bed Mobility;Squat;Stairs;Lift;Locomotion Level;Sleep;Sit    Examination-Participation Restrictions Community Activity;Shop;Laundry;Yard Work;Interpersonal Relationship   usual activities and mobility including getting up from kneeling on the floor, bending over, getting up from the chair, stairs, caring for husband, cleaning, etc.   Stability/Clinical Decision Making Stable/Uncomplicated    Rehab Potential Good    PT Frequency 2x / week    PT Duration 12 weeks    PT Treatment/Interventions ADLs/Self Care Home Management;Moist Heat;Cryotherapy;DME Instruction;Gait training;Stair training;Functional mobility training;Neuromuscular re-education;Balance training;Therapeutic exercise;Therapeutic activities;Patient/family education;Manual techniques;Dry needling;Passive range of motion;Joint Manipulations;Energy conservation    PT Next Visit Plan Updated HEP as appropriate, LE, core, and funcitonal strengthening as tolerated    PT Home Exercise Plan Medbridge Access Code: [email or web address]    Consulted and Agree with Plan of Care Patient             Patient will benefit from skilled therapeutic intervention in order to improve the following deficits and impairments:   Improper body mechanics, Pain, Decreased mobility, Decreased activity tolerance, Decreased endurance, Decreased range of motion, Decreased strength, Hypomobility, Impaired perceived functional ability, Decreased balance, Difficulty walking  Visit Diagnosis: Chronic bilateral low back pain with right-sided sciatica  Pain in right hip  Muscle weakness (generalized)  History of falling     Problem List Patient Active Problem List   Diagnosis Date Noted   Change in bowel habits 07/21/2021   Personal history of colonic polyps    Polyp of descending colon    Overweight 01/24/2020   Osteopenia of multiple sites 01/24/2020   Pelvic organ prolapse quantification stage 1 cystocele 07/25/2019   Female stress incontinence 07/25/2019   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 Graylon Good, PT, DPT 08/28/21, 12:20 PM   [facility] [address], [place], [address] Phone: [phone]   Fax:  [phone]  Name: Rita Cruz MRN: [id number] Date of Birth: 07/29/1941

## 2021-09-01 ENCOUNTER — Encounter: Payer: Self-pay | Admitting: Physical Therapy

## 2021-09-01 ENCOUNTER — Ambulatory Visit: Payer: PPO | Admitting: Physical Therapy

## 2021-09-01 DIAGNOSIS — G8929 Other chronic pain: Secondary | ICD-10-CM

## 2021-09-01 DIAGNOSIS — Z9181 History of falling: Secondary | ICD-10-CM

## 2021-09-01 DIAGNOSIS — M5441 Lumbago with sciatica, right side: Secondary | ICD-10-CM | POA: Diagnosis not present

## 2021-09-01 DIAGNOSIS — M6281 Muscle weakness (generalized): Secondary | ICD-10-CM

## 2021-09-01 DIAGNOSIS — M25551 Pain in right hip: Secondary | ICD-10-CM

## 2021-09-01 NOTE — Therapy (Signed)
[facility] [address] [place], [place], [address] Phone: [phone]   Fax:  [phone]  Physical Therapy Treatment  Patient Details  Name: Rita Cruz MRN: [id number] Date of Birth: 1941-01-31 Referring Provider (PT): Allene Dillon, NP   Encounter Date: 09/01/2021   PT End of Session - 09/01/21 1122     Visit Number 7    Number of Visits 24    Date for PT Re-Evaluation 10/23/21    Authorization Type [employer] reporting period from 07/31/2021    Progress Note Due on Visit 10    PT Start Time 1110    PT Stop Time 1210    PT Time Calculation (min) 60 min    Equipment Utilized During Treatment Gait belt    Activity Tolerance Patient tolerated treatment well    Behavior During Therapy [facility] for tasks assessed/performed             Past Medical History:  Diagnosis Date   Adenomatous polyps    Anxiety    Arthritis    neck   Bronchitis 02/04/2018   Capsulitis of toe of right foot    Chest pain    Adenosine Myoview in 2006 done for atypical chest pain, showed no eviden e for ischemia or infarct   Colon polyps    GERD (gastroesophageal reflux disease)    History of colonic polyps    HTN (hypertension)    Hyperlipidemia    PUD (peptic ulcer disease)    with hx of H. Pylori    Past Surgical History:  Procedure Laterality Date   ABDOMINAL HYSTERECTOMY     BREAST BIOPSY Right 2011   benign mass done in Dr. [place] office   BREAST BIOPSY Right 06/29/2015   top hat marker. PROLIFERATIVE FIBROCYSTIC CHANGE WITH ASSOCIATED    CERVICAL DISCECTOMY     CHOLECYSTECTOMY     COLONOSCOPY WITH PROPOFOL N/A 10/06/2016   Procedure: COLONOSCOPY WITH PROPOFOL;  Surgeon: Lollie Sails, MD;  Location: [facility] ENDOSCOPY;  Service: Endoscopy;  Laterality: N/A;   COLONOSCOPY WITH PROPOFOL N/A 12/30/2018   Procedure: COLONOSCOPY WITH PROPOFOL;  Surgeon: Lollie Sails, MD;  Location: [facility]  ENDOSCOPY;  Service: Endoscopy;  Laterality: N/A;   COLONOSCOPY WITH PROPOFOL N/A 01/30/2020   Procedure: COLONOSCOPY WITH PROPOFOL;  Surgeon: Lucilla Lame, MD;  Location: [facility] ENDOSCOPY;  Service: Endoscopy;  Laterality: N/A;   EYE SURGERY     FRACTURE SURGERY Right 05/21/2020   [place] the [place]. Proximal humerus fracture   hysterectomy-unspecified area     LAPAROSCOPIC RIGHT COLECTOMY Right 01/23/2019   Procedure: LAPAROSCOPIC RIGHT COLECTOMY;  Surgeon: Robert Bellow, MD;  Location: [facility] ORS;  Service: General;  Laterality: Right;    There were no vitals filed for this visit.   Subjective Assessment - 09/01/21 1112     Subjective Patient states she was really sore after last PT session in her usual place in the low back. Upon further discussion it seems she is saying she just continues to have her usual soreness from her daily activities. She reports she gets pain in her mid thoracic spine when she stands and this improves when she sits down. States her neck continues to hurt as it has for years. She does feel that PT exercises do not make her feel worse and her right leg is feeling better. She thinks she is on the right track with PT and feels the tasks she must do at  home are really hard and that's what cause her pain, not PT. Rates current pain 2/10 in the low back. she feels that manual therpay is helpful to her. She did her HEP last night and the day before. She tries to do them every day.    Pertinent History Patient is a 80 y.o. female who presents to outpatient physical therapy with a referral for medical diagnosis lumbar degenerative disc disease, lumbar radiculitis, osteoarthritis of right hip. This patient's chief complaints consist of pain at the low back, right hip and lateral LE and trouble with balance, leading to the following functional deficits: difficulty with usual activities and mobility including getting up from kneeling on the floor, bending over, getting up from the  chair, stairs, caring for husband, cleaning, etc.  Relevant past medical history and comorbidities include anxiety, arthritis, HTN, GERD, peptic ulcer disease, cholecystectomy, adematous polyps, capsulitis of toe of right foot, right humeral fracture surgery (05/21/2020), colectomy, cervical discectomy, osteopenia.    Limitations Lifting;House hold activities;Other (comment)   getting up from kneeling on the floor, bending over, getting up from the chair,   Diagnostic tests Pelvis MRI report 06/05/2021:   "IMPRESSION:  Negative for acute or focal bony abnormality.  Septated cystic lesion posterior to the right acetabulum is likely a  paralabral cyst indicative of a labral tear. Could also be a  ganglion cyst. There is moderate right hip osteoarthritis." Lumbar spine x-ray from [facility] clinic dated 05/21/2021 revealed minimal compression of L1 and L2 could not be can excluded diffuse osteopenia was noted bladder stones could not be excluded.  Sacral and pelvis x-rays from [facility] clinic dated 05/21/2021 radiology noted that a subtle nondisplaced fracture of the lower sacrum cannot be excluded  Right hip x-ray from [facility] clinic dated 05/21/2021 revealed bilateral degenerative changes    Patient Stated Goals Would like to strengthen legs and maybe get rid of the pain in her hip/back    Currently in Pain? Yes    Pain Score 2     Pain Onset More than a month ago              TREATMENT:    Therapeutic exercise: to centralize symptoms and improve ROM, strength, muscular endurance, and activity tolerance required for successful completion of functional activities.  - Hooklying posterior pelvic tilt (PPT), 2 second hold, 1x20. Improved carry over from last session.  - hooklying PPT to bridge with marching, 3x10 each side plus more reps and practice for getting PPT and form correct.  (Manual therapy - see below) - seated knee extension at [facility] machine, 25# cable for B LE, pillow under back of thighs for  comfort, 3x10 (feels strong burn in quads at end of set, needs cuing for adequate rest).  - seated hamstring curl at [facility] machine, 25/35# cable for B LE, 2x10 (consider 30# next time).  - lateral banded walking with green/red theraband around ankles for resistance 2x20 feet each direction. Patient required CGA to maintain balance during this exercise. (red band better).  - monster walks with RTB around ankles, forwards and backwards, 1x20 feet each direction. CGA  - seated LAQ with red theraband loop attached to back leg of chair, 1x10 each side while adjusting for appropriate resistance and teaching patient how to set up for home.  - TRX sit <> stand from 18 inch chair, 3x10, CGA for safety. Cuing for LE alignment.    Pt required multimodal cuing for proper technique and to facilitate improved neuromuscular control, strength,  range of motion, and functional ability resulting in improved performance and form.       Manual therapy: to reduce pain and tissue tension, improve range of motion, neuromodulation, in order to promote improved ability to complete functional activities. PRONE with face in table cradle, pillow under ankles.  - STM to bilateral lumbar paraspinals, Right glutes, with extra time given to tender portion of right glute with trigger point release (palpable decrease in tension).  - CPA grade III-IV to mid thoracic to lumbar spine.      HOME EXERCISE PROGRAM Access Code: [email or web address] URL: [email or web address] Date: 08/26/2021 Prepared by: Rosita Kea   Exercises Standing Single Leg Stance with Counter Support - 1 x daily - 3 sets - 30 seconds hold Supine Pelvic Tilt - 1 x daily - 20 reps Supine Posterior Pelvic Tilt - 1 x daily - 1 sets - 20 reps - 5 seconds hold Bridge with Hip Abduction and Resistance - Ground Touches - 1 x daily - 1 sets - 20 reps - 5 second hold hold Sit to Stand Without Arm Support - 1 x daily - 2-3 sets - 10 reps Supine Piriformis Stretch  with Leg Straight - 1 x daily - 2 sets - 30 hold   PT Education - 09/01/21 1220     Education Details Exercise purpose/form. Self management techniques    Person(s) Educated Patient    Methods Explanation;Demonstration;Tactile cues;Verbal cues    Comprehension Verbalized understanding;Returned demonstration;Verbal cues required;Tactile cues required;Need further instruction              PT Short Term Goals - 08/26/21 1305       PT SHORT TERM GOAL #1   Title Be independent with initial home exercise program for self-management of symptoms.    Baseline Initial HEP provided at IE (07/31/2021);    Time 2    Period Weeks    Status Achieved    Target Date 08/14/21               PT Long Term Goals - 07/31/21 2057       PT LONG TERM GOAL #1   Title Be independent with a long-term home exercise program for self-management of symptoms.    Baseline Initial HEP provided at IE (07/31/2021);    Time 12    Period Weeks    Status New    Target Date 10/23/21      PT LONG TERM GOAL #2   Title Demonstrate improved FOTO to equal or greater than 58 by visit #12 to demonstrate improvement in overall condition and self-reported functional ability.    Baseline 46 (07/31/2021);    Time 12    Period Weeks    Status New    Target Date 10/23/21      PT LONG TERM GOAL #3   Title Patient will complete 5 Time Sit to Stand test in equal or less than 15 seconds from 18.5 inch surface with no use of B UE to demonstrate decreased fall risk and improved functional strength for transfers and household chores.    Baseline 19 seconds from 18.5 inch plinth with B UE support and bracing from back of knees. 2 failed attempts, and one with knees failing to fully extend.    Time 12    Period Weeks    Status New    Target Date 10/23/21      PT LONG TERM GOAL #4   Title Improve B hip strength to equal or greater than  4+/5 with no increase in pain to improve patient's ability to complete  valued functional  tasks such as caring for her husband, lifting, carrying with less difficulty    Baseline as low as 4/5 and painful - see objective (07/31/2021);    Time 12    Period Weeks    Status New      PT LONG TERM GOAL #5   Title Be independent with a long-term home exercise program for self-management of symptoms.    Baseline Functional Limitations: usual activities and mobility including getting up from kneeling on the floor, bending over, getting up from the chair, stairs, caring for husband, cleaning, etc (07/31/2021);    Time 12    Period Weeks    Status New                   Plan - 09/01/21 1219     Clinical Impression Statement Patient tolerated treatment well overall and continues to demonstrate improving strength and carry over of form corrections, although she does continue to need education and cuing for proper form. Patient will be out of town next week and would benefit from updated HEP to adapt to her out of town environment and get adequate strengthening stimulus. Patient would benefit from continued management of limiting condition by skilled physical therapist to address remaining impairments and functional limitations to work towards stated goals and return to PLOF or maximal functional independence.    Personal Factors and Comorbidities Past/Current Experience;Fitness;Time since onset of injury/illness/exacerbation;Comorbidity 3+;Age    Comorbidities Relevant past medical history and comorbidities include anxiety, arthritis, HTN, GERD, peptic ulcer disease, cholecystectomy, adematous polyps, capsulitis of toe of right foot, right humeral fracture surgery (05/21/2020), colectomy, cervical discectomy, osteopenia.    Examination-Activity Limitations Caring for Others;Bend;Bed Mobility;Squat;Stairs;Lift;Locomotion Level;Sleep;Sit    Examination-Participation Restrictions Community Activity;Shop;Laundry;Yard Work;Interpersonal Relationship   usual activities and mobility including  getting up from kneeling on the floor, bending over, getting up from the chair, stairs, caring for husband, cleaning, etc.   Stability/Clinical Decision Making Stable/Uncomplicated    Rehab Potential Good    PT Frequency 2x / week    PT Duration 12 weeks    PT Treatment/Interventions ADLs/Self Care Home Management;Moist Heat;Cryotherapy;DME Instruction;Gait training;Stair training;Functional mobility training;Neuromuscular re-education;Balance training;Therapeutic exercise;Therapeutic activities;Patient/family education;Manual techniques;Dry needling;Passive range of motion;Joint Manipulations;Energy conservation    PT Next Visit Plan Updated HEP as appropriate, LE, core, and funcitonal strengthening as tolerated    PT Home Exercise Plan Medbridge Access Code: [email or web address]    Consulted and Agree with Plan of Care Patient             Patient will benefit from skilled therapeutic intervention in order to improve the following deficits and impairments:  Improper body mechanics, Pain, Decreased mobility, Decreased activity tolerance, Decreased endurance, Decreased range of motion, Decreased strength, Hypomobility, Impaired perceived functional ability, Decreased balance, Difficulty walking  Visit Diagnosis: Chronic bilateral low back pain with right-sided sciatica  Pain in right hip  Muscle weakness (generalized)  History of falling     Problem List Patient Active Problem List   Diagnosis Date Noted   Change in bowel habits 07/21/2021   Personal history of colonic polyps    Polyp of descending colon    Overweight 01/24/2020   Osteopenia of multiple sites 01/24/2020   Pelvic organ prolapse quantification stage 1 cystocele 07/25/2019   Female stress incontinence 07/25/2019   Tubulovillous adenoma polyp of colon 01/23/2019   Adenomatous polyp of ascending colon 01/18/2019   Microcalcification  of both breasts on mammogram 02/04/2018   Allergic rhinitis 05/23/2015   Arthropathy, lower  leg 05/23/2015   Benign neoplasm of colon 05/23/2015   MDD (major depressive disorder) 05/23/2015   Essential (primary) hypertension 05/23/2015   Acid reflux 05/23/2015   Anxiety, generalized 05/23/2015   Hypercholesteremia 05/23/2015   Blood glucose elevated 05/23/2015   Symptomatic menopausal or female climacteric states 05/23/2015   Arthritis, degenerative 05/23/2015   Mixed incontinence 03/24/2013    Everlean Alstrom. Graylon Good, PT, DPT 09/01/21, 12:22 PM   [facility] [address] [place], [address] Phone: [phone]   Fax:  [phone]  Name: Rita Cruz MRN: [id number] Date of Birth: [birth year]

## 2021-09-02 ENCOUNTER — Other Ambulatory Visit: Payer: Self-pay

## 2021-09-02 ENCOUNTER — Encounter: Payer: Self-pay | Admitting: Family Medicine

## 2021-09-02 ENCOUNTER — Ambulatory Visit (INDEPENDENT_AMBULATORY_CARE_PROVIDER_SITE_OTHER): Payer: PPO | Admitting: Family Medicine

## 2021-09-02 VITALS — BP 128/64 | HR 90 | Temp 98.2°F | Resp 16 | Ht 69.0 in | Wt 175.3 lb

## 2021-09-02 DIAGNOSIS — F331 Major depressive disorder, recurrent, moderate: Secondary | ICD-10-CM

## 2021-09-02 DIAGNOSIS — I10 Essential (primary) hypertension: Secondary | ICD-10-CM | POA: Diagnosis not present

## 2021-09-02 DIAGNOSIS — F411 Generalized anxiety disorder: Secondary | ICD-10-CM

## 2021-09-02 DIAGNOSIS — Z23 Encounter for immunization: Secondary | ICD-10-CM

## 2021-09-02 NOTE — Assessment & Plan Note (Signed)
Chronic and well controlled Continue zoloft at current dose Consider therapy

## 2021-09-02 NOTE — Assessment & Plan Note (Signed)
Chronic and well controlled Much improved on higher dose of Zoloft, which we will continue Will consider therapy in the future

## 2021-09-02 NOTE — Progress Notes (Signed)
Established patient visit   Patient: Rita Cruz   DOB: 17-Mar-1941   80 y.o. Female  MRN: [id number] Visit Date: 09/02/2021  Today's healthcare provider: [name], MD   Chief Complaint  Patient presents with   Hypertension   Subjective    Hypertension Pertinent negatives include no chest pain, palpitations or shortness of breath.   Follow up for hypertension  The patient was last seen for this 1 months ago. Changes made at last visit include increase losartan to 100mg  daily.  She reports excellent compliance with treatment. She feels that condition is Improved. She is not having side effects.   -----------------------------------------------------------------------------------------     Medications: Outpatient Medications Prior to Visit  Medication Sig   acetaminophen (TYLENOL) 325 MG tablet Take 650 mg by mouth every 6 (six) hours as needed for moderate pain.   ALPRAZolam (XANAX) 0.5 MG tablet TAKE 1 TABLET BY MOUTH EVERY 12 HOURS AS NEEDED (MUST  LAST  30  DAYS)   aspirin 81 MG EC tablet Take 81 mg by mouth at bedtime.    atorvastatin (LIPITOR) 10 MG tablet Take 1 tablet (10 mg total) by mouth daily.   co-enzyme Q-10 30 MG capsule Take by mouth daily. Unsure dose   fluticasone (FLONASE) 50 MCG/ACT nasal spray Place 2 sprays into both nostrils daily as needed for allergies.   GENTEAL TEARS 0.1-0.3 % SOLN as needed.    losartan (COZAAR) 100 MG tablet Take 1 tablet (100 mg total) by mouth daily.   omeprazole (PRILOSEC) 20 MG capsule Take 1 capsule by mouth once daily   sertraline (ZOLOFT) 100 MG tablet Take 1.5 tablets (150 mg total) by mouth daily.   [DISCONTINUED] ascorbic Acid (VITAMIN C) 500 MG CPCR Take 500 mg by mouth daily.   [DISCONTINUED] Calcium Carbonate (CALCIUM 600 PO) Take 600 mg by mouth daily at 6 (six) AM.   [DISCONTINUED] conjugated estrogens (PREMARIN) vaginal cream Place 1 Applicatorful vaginally 2 (two) times a week.    [DISCONTINUED] ketotifen (ZADITOR) 0.025 % ophthalmic solution Place 1 drop into both eyes daily as needed (allergies).   [DISCONTINUED] Multiple Vitamins-Minerals (PRESERVISION AREDS 2 PO) Take 1 tablet by mouth 2 (two) times daily.   [DISCONTINUED] oxybutynin (DITROPAN) 5 MG tablet Take 1 tablet (5 mg total) by mouth 2 (two) times daily. (Patient not taking: No sig reported)   No facility-administered medications prior to visit.    Review of Systems  Constitutional:  Negative for activity change, appetite change and fatigue.  Respiratory:  Negative for shortness of breath.   Cardiovascular:  Negative for chest pain, palpitations and leg swelling.       Objective    BP 128/64 (BP Location: Right Arm, Patient Position: Sitting, Cuff Size: Normal)   Pulse 90   Temp 98.2 F (36.8 C) (Oral)   Resp 16   Ht 5\' 9"  (1.753 m)   Wt 175 lb 4.8 oz (79.5 kg)   SpO2 97%   BMI 25.89 kg/m  BP Readings from Last 3 Encounters:  09/02/21 128/64  07/21/21 (!) 174/98  01/16/21 (!) 173/76   Wt Readings from Last 3 Encounters:  09/02/21 175 lb 4.8 oz (79.5 kg)  07/21/21 178 lb 4.8 oz (80.9 kg)  01/16/21 182 lb (82.6 kg)      Physical Exam Vitals reviewed.  Constitutional:      General: She is not in acute distress.    Appearance: Normal appearance. She is well-developed. She is not diaphoretic.  HENT:  Head: Normocephalic and atraumatic.  Eyes:     General: No scleral icterus.    Conjunctiva/sclera: Conjunctivae normal.  Neck:     Thyroid: No thyromegaly.  Cardiovascular:     Rate and Rhythm: Normal rate and regular rhythm.     Pulses: Normal pulses.     Heart sounds: Normal heart sounds. No murmur heard. Pulmonary:     Effort: Pulmonary effort is normal. No respiratory distress.     Breath sounds: Normal breath sounds. No wheezing, rhonchi or rales.  Musculoskeletal:     Cervical back: Neck supple.     Right lower leg: No edema.     Left lower leg: No edema.   Lymphadenopathy:     Cervical: No cervical adenopathy.  Skin:    General: Skin is warm and dry.     Findings: No rash.  Neurological:     Mental Status: She is alert and oriented to person, place, and time. Mental status is at baseline.  Psychiatric:        Mood and Affect: Mood normal.        Behavior: Behavior normal.      No results found for any visits on 09/02/21.  Assessment & Plan     Problem List Items Addressed This Visit       Cardiovascular and Mediastinum   Essential (primary) hypertension - Primary    Well controlled Continue current medications Reviewed recent metabolic panel F/u in 6 months         Other   MDD (major depressive disorder)    Chronic and well controlled Much improved on higher dose of Zoloft, which we will continue Will consider therapy in the future      Anxiety, generalized    Chronic and well controlled Continue zoloft at current dose Consider therapy      Other Visit Diagnoses     Need for immunization against influenza       Relevant Orders   Flu Vaccine QUAD High Dose(Fluad) (Completed)       Recommend bivalent COVID booster  Return in about 6 months (around 03/03/2022) for chronic disease f/u and CPE in 8/23.      I, [name], MD, have reviewed all documentation for this visit. The documentation on 09/02/21 for the exam, diagnosis, procedures, and orders are all accurate and complete.   Esme Durkin, Dionne Bucy, MD, MPH [facility]

## 2021-09-02 NOTE — Assessment & Plan Note (Signed)
Well controlled Continue current medications Reviewed recent metabolic panel F/u in 6 months  

## 2021-09-04 ENCOUNTER — Ambulatory Visit: Payer: PPO | Admitting: Physical Therapy

## 2021-09-04 ENCOUNTER — Encounter: Payer: PPO | Admitting: Physical Therapy

## 2021-09-04 DIAGNOSIS — Z9181 History of falling: Secondary | ICD-10-CM

## 2021-09-04 DIAGNOSIS — M25551 Pain in right hip: Secondary | ICD-10-CM

## 2021-09-04 DIAGNOSIS — M6281 Muscle weakness (generalized): Secondary | ICD-10-CM

## 2021-09-04 DIAGNOSIS — G8929 Other chronic pain: Secondary | ICD-10-CM

## 2021-09-04 DIAGNOSIS — M5441 Lumbago with sciatica, right side: Secondary | ICD-10-CM | POA: Diagnosis not present

## 2021-09-04 NOTE — Therapy (Signed)
[facility] [address], [place], [address] Phone: [phone]   Fax:  [phone]  Physical Therapy Treatment  Patient Details  Name: Rita Cruz MRN: [id number] Date of Birth: 1941/03/12 Referring Provider (PT): Allene Dillon, NP   Encounter Date: 09/04/2021   PT End of Session - 09/04/21 1210     Visit Number 8    Number of Visits 24    Date for PT Re-Evaluation 10/23/21    Authorization Type [employer] reporting period from 07/31/2021    Progress Note Due on Visit 10    PT Start Time 1121    PT Stop Time 1205    PT Time Calculation (min) 44 min    Equipment Utilized During Treatment Gait belt    Activity Tolerance Patient tolerated treatment well    Behavior During Therapy [facility] for tasks assessed/performed             Past Medical History:  Diagnosis Date   Adenomatous polyps    Anxiety    Arthritis    neck   Bronchitis 02/04/2018   Capsulitis of toe of right foot    Chest pain    Adenosine Myoview in 2006 done for atypical chest pain, showed no eviden e for ischemia or infarct   Colon polyps    GERD (gastroesophageal reflux disease)    History of colonic polyps    HTN (hypertension)    Hyperlipidemia    PUD (peptic ulcer disease)    with hx of H. Pylori    Past Surgical History:  Procedure Laterality Date   ABDOMINAL HYSTERECTOMY     BREAST BIOPSY Right 2011   benign mass done in Dr. [place] office   BREAST BIOPSY Right 06/29/2015   top hat marker. PROLIFERATIVE FIBROCYSTIC CHANGE WITH ASSOCIATED    CERVICAL DISCECTOMY     CHOLECYSTECTOMY     COLONOSCOPY WITH PROPOFOL N/A 10/06/2016   Procedure: COLONOSCOPY WITH PROPOFOL;  Surgeon: Lollie Sails, MD;  Location: [facility] ENDOSCOPY;  Service: Endoscopy;  Laterality: N/A;   COLONOSCOPY WITH PROPOFOL N/A 12/30/2018   Procedure: COLONOSCOPY WITH PROPOFOL;  Surgeon: Lollie Sails, MD;  Location: [facility]  ENDOSCOPY;  Service: Endoscopy;  Laterality: N/A;   COLONOSCOPY WITH PROPOFOL N/A 01/30/2020   Procedure: COLONOSCOPY WITH PROPOFOL;  Surgeon: Lucilla Lame, MD;  Location: [facility] ENDOSCOPY;  Service: Endoscopy;  Laterality: N/A;   EYE SURGERY     FRACTURE SURGERY Right 05/21/2020   [place] the [place]. Proximal humerus fracture   hysterectomy-unspecified area     LAPAROSCOPIC RIGHT COLECTOMY Right 01/23/2019   Procedure: LAPAROSCOPIC RIGHT COLECTOMY;  Surgeon: Robert Bellow, MD;  Location: [facility] ORS;  Service: General;  Laterality: Right;    There were no vitals filed for this visit.   Subjective Assessment - 09/04/21 1124     Subjective Patient reports she is feeling well today with a little soreness in low back of 2/10. She states she did her knee extension with the band at home last night and felt it could have been harder but otherwise worked good. States she was a little sore at her low back after last PT session but not as much as after previous sessions.    Pertinent History Patient is a 80 y.o. female who presents to outpatient physical therapy with a referral for medical diagnosis lumbar degenerative disc disease, lumbar radiculitis, osteoarthritis of right hip. This patient's chief complaints consist of pain at the low  back, right hip and lateral LE and trouble with balance, leading to the following functional deficits: difficulty with usual activities and mobility including getting up from kneeling on the floor, bending over, getting up from the chair, stairs, caring for husband, cleaning, etc.  Relevant past medical history and comorbidities include anxiety, arthritis, HTN, GERD, peptic ulcer disease, cholecystectomy, adematous polyps, capsulitis of toe of right foot, right humeral fracture surgery (05/21/2020), colectomy, cervical discectomy, osteopenia.    Limitations Lifting;House hold activities;Other (comment)   getting up from kneeling on the floor, bending over, getting up from  the chair,   Diagnostic tests Pelvis MRI report 06/05/2021:   "IMPRESSION:  Negative for acute or focal bony abnormality.  Septated cystic lesion posterior to the right acetabulum is likely a  paralabral cyst indicative of a labral tear. Could also be a  ganglion cyst. There is moderate right hip osteoarthritis." Lumbar spine x-ray from [facility] clinic dated 05/21/2021 revealed minimal compression of L1 and L2 could not be can excluded diffuse osteopenia was noted bladder stones could not be excluded.  Sacral and pelvis x-rays from [facility] clinic dated 05/21/2021 radiology noted that a subtle nondisplaced fracture of the lower sacrum cannot be excluded  Right hip x-ray from [facility] clinic dated 05/21/2021 revealed bilateral degenerative changes    Patient Stated Goals Would like to strengthen legs and maybe get rid of the pain in her hip/back    Pain Score 2     Pain Location Back    Pain Onset More than a month ago               TREATMENT:    Therapeutic exercise: to centralize symptoms and improve ROM, strength, muscular endurance, and activity tolerance required for successful completion of functional activities.  - NuStep level 5 using bilateral lower extremities only. Seat setting 11. For improved extremity mobility, muscular endurance, and activity tolerance; and to induce the analgesic effect of aerobic exercise, stimulate improved joint nutrition, and prepare body structures and systems for following interventions. X 6  minutes. Average SPM = 60. - hooklying anterior/posterior pelvic tilt AROM, 1x10 (review of HEP).  - Hooklying posterior pelvic tilt (PPT), 5 second hold, 1x10. Improved carry over from last session.  - hooklying PPT with marching, 1x10 each side plus more reps and practice for getting PPT and form correct.  - hooklying PPT with alternating LE extension, 1x10 each side (struggles with keeping PPT with extension but can do with cuing) - hooklying PPT with ASLR, 2x10 each  side including with teachback method with handout.  - seated LAQ with GTB/BlueTB loop attached to back leg of chair, 2x30 each side while adjusting for appropriate resistance and teaching patient how to set up for home. Teachback method utilized.  - Education on HEP including handout   Pt required multimodal cuing for proper technique and to facilitate improved neuromuscular control, strength, range of motion, and functional ability resulting in improved performance and form.     HOME EXERCISE PROGRAM Access Code: [email or web address] URL: [email or web address] Date: 09/04/2021 Prepared by: Rosita Kea  Exercises (do half each day) Standing Single Leg Stance with Counter Support - 1 x daily - 3 sets - 30 seconds hold Supine Posterior Pelvic Tilt - 1 x daily - 1 sets - 10 reps - 5 seconds hold Supine Pelvic Tilt with Straight Leg Raise - 1 x daily - 3 sets - 10 reps Bridge with Hip Abduction and Resistance - Ground Touches - 1 x daily - 1 sets -  20 reps - 5 second hold hold Sit to Stand Without Arm Support - 1 x daily - 2-3 sets - 10 reps Supine Piriformis Stretch with Leg Straight - 1 x daily - 2 sets - 30 hold Sitting Knee Extension with Resistance - 1 x daily - 1-3 sets - 30 reps      PT Education - 09/04/21 1158     Education Details Exercise purpose/form. Self management techniques    Person(s) Educated Patient    Methods Explanation;Demonstration;Tactile cues;Verbal cues    Comprehension Verbalized understanding;Returned demonstration;Verbal cues required;Tactile cues required;Need further instruction              PT Short Term Goals - 08/26/21 1305       PT SHORT TERM GOAL #1   Title Be independent with initial home exercise program for self-management of symptoms.    Baseline Initial HEP provided at IE (07/31/2021);    Time 2    Period Weeks    Status Achieved    Target Date 08/14/21               PT Long Term Goals - 07/31/21 2057       PT LONG TERM  GOAL #1   Title Be independent with a long-term home exercise program for self-management of symptoms.    Baseline Initial HEP provided at IE (07/31/2021);    Time 12    Period Weeks    Status New    Target Date 10/23/21      PT LONG TERM GOAL #2   Title Demonstrate improved FOTO to equal or greater than 58 by visit #12 to demonstrate improvement in overall condition and self-reported functional ability.    Baseline 46 (07/31/2021);    Time 12    Period Weeks    Status New    Target Date 10/23/21      PT LONG TERM GOAL #3   Title Patient will complete 5 Time Sit to Stand test in equal or less than 15 seconds from 18.5 inch surface with no use of B UE to demonstrate decreased fall risk and improved functional strength for transfers and household chores.    Baseline 19 seconds from 18.5 inch plinth with B UE support and bracing from back of knees. 2 failed attempts, and one with knees failing to fully extend.    Time 12    Period Weeks    Status New    Target Date 10/23/21      PT LONG TERM GOAL #4   Title Improve B hip strength to equal or greater than 4+/5 with no increase in pain to improve patient's ability to complete  valued functional tasks such as caring for her husband, lifting, carrying with less difficulty    Baseline as low as 4/5 and painful - see objective (07/31/2021);    Time 12    Period Weeks    Status New      PT LONG TERM GOAL #5   Title Be independent with a long-term home exercise program for self-management of symptoms.    Baseline Functional Limitations: usual activities and mobility including getting up from kneeling on the floor, bending over, getting up from the chair, stairs, caring for husband, cleaning, etc (07/31/2021);    Time 12    Period Weeks    Status New                   Plan - 09/04/21 1209     Clinical  Impression Statement Pateint tolerated treatment well and reported her back felt good by end of session. Visit focused on  updating/modifying HEP and teaching independence with lumbopelvic control, core and quad exercises to allow patient to get more out of her HEP while she is on vacation next week. Patient is making progress towards goals but continues to have difficulty with function due to pain, weakness, and imbalance. Patient would benefit from continued management of limiting condition by skilled physical therapist to address remaining impairments and functional limitations to work towards stated goals and return to PLOF or maximal functional independence.    Personal Factors and Comorbidities Past/Current Experience;Fitness;Time since onset of injury/illness/exacerbation;Comorbidity 3+;Age    Comorbidities Relevant past medical history and comorbidities include anxiety, arthritis, HTN, GERD, peptic ulcer disease, cholecystectomy, adematous polyps, capsulitis of toe of right foot, right humeral fracture surgery (05/21/2020), colectomy, cervical discectomy, osteopenia.    Examination-Activity Limitations Caring for Others;Bend;Bed Mobility;Squat;Stairs;Lift;Locomotion Level;Sleep;Sit    Examination-Participation Restrictions Community Activity;Shop;Laundry;Yard Work;Interpersonal Relationship   usual activities and mobility including getting up from kneeling on the floor, bending over, getting up from the chair, stairs, caring for husband, cleaning, etc.   Stability/Clinical Decision Making Stable/Uncomplicated    Rehab Potential Good    PT Frequency 2x / week    PT Duration 12 weeks    PT Treatment/Interventions ADLs/Self Care Home Management;Moist Heat;Cryotherapy;DME Instruction;Gait training;Stair training;Functional mobility training;Neuromuscular re-education;Balance training;Therapeutic exercise;Therapeutic activities;Patient/family education;Manual techniques;Dry needling;Passive range of motion;Joint Manipulations;Energy conservation    PT Next Visit Plan Updated HEP as appropriate, LE, core, and funcitonal  strengthening as tolerated    PT Home Exercise Plan Medbridge Access Code: [email or web address]    Consulted and Agree with Plan of Care Patient             Patient will benefit from skilled therapeutic intervention in order to improve the following deficits and impairments:  Improper body mechanics, Pain, Decreased mobility, Decreased activity tolerance, Decreased endurance, Decreased range of motion, Decreased strength, Hypomobility, Impaired perceived functional ability, Decreased balance, Difficulty walking  Visit Diagnosis: Chronic bilateral low back pain with right-sided sciatica  Pain in right hip  Muscle weakness (generalized)  History of falling     Problem List Patient Active Problem List   Diagnosis Date Noted   Change in bowel habits 07/21/2021   Personal history of colonic polyps    Polyp of descending colon    Overweight 01/24/2020   Osteopenia of multiple sites 01/24/2020   Pelvic organ prolapse quantification stage 1 cystocele 07/25/2019   Female stress incontinence 07/25/2019   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 Graylon Good, PT, DPT 09/04/21, 12:11 PM   [facility] [address] [place], [address] Phone: [phone]   Fax:  [phone]  Name: Kaleya Douse MRN: [id number] Date of Birth:  1941/02/02

## 2021-09-09 ENCOUNTER — Encounter: Payer: PPO | Admitting: Physical Therapy

## 2021-09-11 ENCOUNTER — Encounter: Payer: PPO | Admitting: Physical Therapy

## 2021-09-16 ENCOUNTER — Encounter: Payer: Self-pay | Admitting: Physical Therapy

## 2021-09-16 ENCOUNTER — Ambulatory Visit: Payer: PPO | Admitting: Physical Therapy

## 2021-09-16 ENCOUNTER — Other Ambulatory Visit: Payer: Self-pay | Admitting: Family Medicine

## 2021-09-16 DIAGNOSIS — F418 Other specified anxiety disorders: Secondary | ICD-10-CM

## 2021-09-16 DIAGNOSIS — M6281 Muscle weakness (generalized): Secondary | ICD-10-CM

## 2021-09-16 DIAGNOSIS — M5441 Lumbago with sciatica, right side: Secondary | ICD-10-CM | POA: Diagnosis not present

## 2021-09-16 DIAGNOSIS — G8929 Other chronic pain: Secondary | ICD-10-CM

## 2021-09-16 DIAGNOSIS — Z9181 History of falling: Secondary | ICD-10-CM

## 2021-09-16 DIAGNOSIS — M25551 Pain in right hip: Secondary | ICD-10-CM

## 2021-09-16 NOTE — Telephone Encounter (Signed)
Requested medications are due for refill today.  yes  Requested medications are on the active medications list.  yes  Last refill. 08/19/2021  Future visit scheduled.   yes  Notes to clinic.  Medication not delegated.

## 2021-09-16 NOTE — Therapy (Signed)
[facility] [address], [place], [address] Phone: [phone]   Fax:  [phone]  Physical Therapy Treatment  Patient Details  Name: Rita Cruz MRN: [id number] Date of Birth: 1941/02/02 Referring Provider (PT): Allene Dillon, NP   Encounter Date: 09/16/2021   PT End of Session - 09/16/21 1120     Visit Number 9    Number of Visits 24    Date for PT Re-Evaluation 10/23/21    Authorization Type [employer] reporting period from 07/31/2021    Progress Note Due on Visit 10    PT Start Time 1115    PT Stop Time 1155    PT Time Calculation (min) 40 min    Equipment Utilized During Treatment Gait belt    Activity Tolerance Patient tolerated treatment well    Behavior During Therapy [facility] for tasks assessed/performed             Past Medical History:  Diagnosis Date   Adenomatous polyps    Anxiety    Arthritis    neck   Bronchitis 02/04/2018   Capsulitis of toe of right foot    Chest pain    Adenosine Myoview in 2006 done for atypical chest pain, showed no eviden e for ischemia or infarct   Colon polyps    GERD (gastroesophageal reflux disease)    History of colonic polyps    HTN (hypertension)    Hyperlipidemia    PUD (peptic ulcer disease)    with hx of H. Pylori    Past Surgical History:  Procedure Laterality Date   ABDOMINAL HYSTERECTOMY     BREAST BIOPSY Right 2011   benign mass done in Dr. [place] office   BREAST BIOPSY Right 06/29/2015   top hat marker. PROLIFERATIVE FIBROCYSTIC CHANGE WITH ASSOCIATED    CERVICAL DISCECTOMY     CHOLECYSTECTOMY     COLONOSCOPY WITH PROPOFOL N/A 10/06/2016   Procedure: COLONOSCOPY WITH PROPOFOL;  Surgeon: Lollie Sails, MD;  Location: [facility] ENDOSCOPY;  Service: Endoscopy;  Laterality: N/A;   COLONOSCOPY WITH PROPOFOL N/A 12/30/2018   Procedure: COLONOSCOPY WITH PROPOFOL;  Surgeon: Lollie Sails, MD;  Location: [facility]  ENDOSCOPY;  Service: Endoscopy;  Laterality: N/A;   COLONOSCOPY WITH PROPOFOL N/A 01/30/2020   Procedure: COLONOSCOPY WITH PROPOFOL;  Surgeon: Lucilla Lame, MD;  Location: [facility] ENDOSCOPY;  Service: Endoscopy;  Laterality: N/A;   EYE SURGERY     FRACTURE SURGERY Right 05/21/2020   fell while at the beach. Proximal humerus fracture   hysterectomy-unspecified area     LAPAROSCOPIC RIGHT COLECTOMY Right 01/23/2019   Procedure: LAPAROSCOPIC RIGHT COLECTOMY;  Surgeon: Robert Bellow, MD;  Location: [facility] ORS;  Service: General;  Laterality: Right;    There were no vitals filed for this visit.   Subjective Assessment - 09/16/21 1118     Subjective Patient reports she is feeling well today. She has a little soreness in her back but feels her back is much better. She was able to drive home from the beach without stopping. She  did have a little pain in her left hip. Brought her therabands today because she got confused which one to use for what and would like to clarify. She states she definitely feels stronger. She did not have her HEP as much as she should have but she was active during the time she had at the beach including moving a lot of boxes.    Pertinent History Patient is  a 80 y.o. female who presents to outpatient physical therapy with a referral for medical diagnosis lumbar degenerative disc disease, lumbar radiculitis, osteoarthritis of right hip. This patient's chief complaints consist of pain at the low back, right hip and lateral LE and trouble with balance, leading to the following functional deficits: difficulty with usual activities and mobility including getting up from kneeling on the floor, bending over, getting up from the chair, stairs, caring for husband, cleaning, etc.  Relevant past medical history and comorbidities include anxiety, arthritis, HTN, GERD, peptic ulcer disease, cholecystectomy, adematous polyps, capsulitis of toe of right foot, right humeral fracture surgery  (05/21/2020), colectomy, cervical discectomy, osteopenia.    Limitations Lifting;House hold activities;Other (comment)   getting up from kneeling on the floor, bending over, getting up from the chair,   Diagnostic tests Pelvis MRI report 06/05/2021:   "IMPRESSION:  Negative for acute or focal bony abnormality.  Septated cystic lesion posterior to the right acetabulum is likely a  paralabral cyst indicative of a labral tear. Could also be a  ganglion cyst. There is moderate right hip osteoarthritis." Lumbar spine x-ray from [facility] clinic dated 05/21/2021 revealed minimal compression of L1 and L2 could not be can excluded diffuse osteopenia was noted bladder stones could not be excluded.  Sacral and pelvis x-rays from [facility] clinic dated 05/21/2021 radiology noted that a subtle nondisplaced fracture of the lower sacrum cannot be excluded  Right hip x-ray from [facility] clinic dated 05/21/2021 revealed bilateral degenerative changes    Patient Stated Goals Would like to strengthen legs and maybe get rid of the pain in her hip/back    Currently in Pain? Yes    Pain Score 1     Pain Onset More than a month ago               TREATMENT:    Therapeutic exercise: to centralize symptoms and improve ROM, strength, muscular endurance, and activity tolerance required for successful completion of functional activities.  - NuStep level 5 using bilateral lower extremities only. Seat setting 11. For improved extremity mobility, muscular endurance, and activity tolerance; and to induce the analgesic effect of aerobic exercise, stimulate improved joint nutrition, and prepare body structures and systems for following interventions. X 6 minutes. Average SPM = 80. - hooklying anterior/posterior pelvic tilt AROM, 1x20 (review of HEP).  - Hooklying posterior pelvic tilt (PPT), 5 second hold, 1x10. Improved carry over from last session.  - hooklying PPT variations working on isolating breathing and leg movement without  losing PPT.  Includes ASLR.  - bridge with PPT and hip abduction against red theraband, 1x20, 5 second holds.  - seated LAQ with BlueTB loop attached to back leg of chair, 3x20 each side - Education on HEP including handout    Pt required multimodal cuing for proper technique and to facilitate improved neuromuscular control, strength, range of motion, and functional ability resulting in improved performance and form.     HOME EXERCISE PROGRAM Access Code: [email or web address] URL: [email or web address] Date: 09/16/2021 Prepared by: Rosita Kea  Exercises Supine Posterior Pelvic Tilt - 1 x daily - 1 sets - 5-10 reps - 10 breaths hold Bridge with Hip Abduction and Resistance - Ground Touches - 1 x daily - 1 sets - 20 reps - 5 second hold hold Sit to Stand Without Arm Support - 1 x daily - 2-3 sets - 10 reps Sitting Knee Extension with Resistance - 1 x daily - 3 sets - 15-20 reps Standing Single Leg Stance  with Counter Support - 1 x daily - 3 sets - 30 seconds hold    PT Education - 09/16/21 1120     Education Details Exercise purpose/form. Self management techniques    Person(s) Educated Patient    Methods Explanation;Demonstration;Tactile cues;Verbal cues    Comprehension Verbalized understanding;Returned demonstration;Verbal cues required;Tactile cues required;Need further instruction              PT Short Term Goals - 08/26/21 1305       PT SHORT TERM GOAL #1   Title Be independent with initial home exercise program for self-management of symptoms.    Baseline Initial HEP provided at IE (07/31/2021);    Time 2    Period Weeks    Status Achieved    Target Date 08/14/21               PT Long Term Goals - 07/31/21 2057       PT LONG TERM GOAL #1   Title Be independent with a long-term home exercise program for self-management of symptoms.    Baseline Initial HEP provided at IE (07/31/2021);    Time 12    Period Weeks    Status New    Target Date 10/23/21       PT LONG TERM GOAL #2   Title Demonstrate improved FOTO to equal or greater than 58 by visit #12 to demonstrate improvement in overall condition and self-reported functional ability.    Baseline 46 (07/31/2021);    Time 12    Period Weeks    Status New    Target Date 10/23/21      PT LONG TERM GOAL #3   Title Patient will complete 5 Time Sit to Stand test in equal or less than 15 seconds from 18.5 inch surface with no use of B UE to demonstrate decreased fall risk and improved functional strength for transfers and household chores.    Baseline 19 seconds from 18.5 inch plinth with B UE support and bracing from back of knees. 2 failed attempts, and one with knees failing to fully extend.    Time 12    Period Weeks    Status New    Target Date 10/23/21      PT LONG TERM GOAL #4   Title Improve B hip strength to equal or greater than 4+/5 with no increase in pain to improve patient's ability to complete  valued functional tasks such as caring for her husband, lifting, carrying with less difficulty    Baseline as low as 4/5 and painful - see objective (07/31/2021);    Time 12    Period Weeks    Status New      PT LONG TERM GOAL #5   Title Be independent with a long-term home exercise program for self-management of symptoms.    Baseline Functional Limitations: usual activities and mobility including getting up from kneeling on the floor, bending over, getting up from the chair, stairs, caring for husband, cleaning, etc (07/31/2021);    Time 12    Period Weeks    Status New                   Plan - 09/16/21 1157     Clinical Impression Statement Patient tolerated treatment well overall and continues to work on improving lumbopelvic control and LE strength. Continues to require heavy cuing to improve lumbopelvic coordination and remember hot to compete HEP. Patient appears to be improving overall but lacks LE  strength to get up from chair without UE support. Patient would benefit from  continued management of limiting condition by skilled physical therapist to address remaining impairments and functional limitations to work towards stated goals and return to PLOF or maximal functional independence.    Personal Factors and Comorbidities Past/Current Experience;Fitness;Time since onset of injury/illness/exacerbation;Comorbidity 3+;Age    Comorbidities Relevant past medical history and comorbidities include anxiety, arthritis, HTN, GERD, peptic ulcer disease, cholecystectomy, adematous polyps, capsulitis of toe of right foot, right humeral fracture surgery (05/21/2020), colectomy, cervical discectomy, osteopenia.    Examination-Activity Limitations Caring for Others;Bend;Bed Mobility;Squat;Stairs;Lift;Locomotion Level;Sleep;Sit    Examination-Participation Restrictions Community Activity;Shop;Laundry;Yard Work;Interpersonal Relationship   usual activities and mobility including getting up from kneeling on the floor, bending over, getting up from the chair, stairs, caring for husband, cleaning, etc.   Stability/Clinical Decision Making Stable/Uncomplicated    Rehab Potential Good    PT Frequency 2x / week    PT Duration 12 weeks    PT Treatment/Interventions ADLs/Self Care Home Management;Moist Heat;Cryotherapy;DME Instruction;Gait training;Stair training;Functional mobility training;Neuromuscular re-education;Balance training;Therapeutic exercise;Therapeutic activities;Patient/family education;Manual techniques;Dry needling;Passive range of motion;Joint Manipulations;Energy conservation    PT Next Visit Plan Updated HEP as appropriate, LE, core, and funcitonal strengthening as tolerated    PT Home Exercise Plan Medbridge Access Code: [email or web address]    Consulted and Agree with Plan of Care Patient             Patient will benefit from skilled therapeutic intervention in order to improve the following deficits and impairments:  Improper body mechanics, Pain, Decreased mobility, Decreased  activity tolerance, Decreased endurance, Decreased range of motion, Decreased strength, Hypomobility, Impaired perceived functional ability, Decreased balance, Difficulty walking  Visit Diagnosis: Chronic bilateral low back pain with right-sided sciatica  Pain in right hip  Muscle weakness (generalized)  History of falling     Problem List Patient Active Problem List   Diagnosis Date Noted   Change in bowel habits 07/21/2021   Personal history of colonic polyps    Polyp of descending colon    Overweight 01/24/2020   Osteopenia of multiple sites 01/24/2020   Pelvic organ prolapse quantification stage 1 cystocele 07/25/2019   Female stress incontinence 07/25/2019   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 Graylon Good, PT, DPT 09/16/21, 12:04 PM  [facility] [address], [place], [address] Phone: [phone]   Fax:  [phone]  Name: Sloan Galentine MRN: [id number] Date of Birth: 05/19/1941

## 2021-09-17 ENCOUNTER — Other Ambulatory Visit: Payer: Self-pay | Admitting: Family Medicine

## 2021-09-17 DIAGNOSIS — E7800 Pure hypercholesterolemia, unspecified: Secondary | ICD-10-CM

## 2021-09-17 NOTE — Telephone Encounter (Signed)
Requested Prescriptions  Pending Prescriptions Disp Refills  . atorvastatin (LIPITOR) 10 MG tablet [Pharmacy Med Name: Atorvastatin Calcium 10 MG Oral Tablet] 90 tablet 0    Sig: Take 1 tablet by mouth once daily     Cardiovascular:  Antilipid - Statins Passed - 09/17/2021 11:13 AM      Passed - Total Cholesterol in normal range and within 360 days    Cholesterol, Total  Date Value Ref Range Status  01/16/2021 122 100 - 199 mg/dL Final         Passed - LDL in normal range and within 360 days    LDL Chol Calc (NIH)  Date Value Ref Range Status  01/16/2021 57 0 - 99 mg/dL Final         Passed - HDL in normal range and within 360 days    HDL  Date Value Ref Range Status  01/16/2021 43 >39 mg/dL Final         Passed - Triglycerides in normal range and within 360 days    Triglycerides  Date Value Ref Range Status  01/16/2021 124 0 - 149 mg/dL Final         Passed - Patient is not pregnant      Passed - Valid encounter within last 12 months    Recent Outpatient Visits          2 weeks ago Essential (primary) hypertension   [facility], Dionne Bucy, MD   1 month ago Encounter for annual physical exam   [facility], Dionne Bucy, MD   8 months ago Essential (primary) hypertension   [facility], Dionne Bucy, MD   1 year ago Essential (primary) hypertension   [facility], Dionne Bucy, MD   1 year ago Encounter for annual physical exam   [facility], Dionne Bucy, MD      Future Appointments            In 6 months Bacigal[facility], Dionne Bucy, MD [facility], [place]   In 9 months Bacigalupo, Dionne Bucy, MD [facility], [place]

## 2021-09-18 ENCOUNTER — Ambulatory Visit: Payer: PPO | Admitting: Physical Therapy

## 2021-09-23 ENCOUNTER — Encounter: Payer: Self-pay | Admitting: Physical Therapy

## 2021-09-23 ENCOUNTER — Ambulatory Visit: Payer: PPO | Attending: Family Medicine | Admitting: Physical Therapy

## 2021-09-23 DIAGNOSIS — M6281 Muscle weakness (generalized): Secondary | ICD-10-CM | POA: Diagnosis not present

## 2021-09-23 DIAGNOSIS — M25551 Pain in right hip: Secondary | ICD-10-CM | POA: Diagnosis not present

## 2021-09-23 DIAGNOSIS — M5441 Lumbago with sciatica, right side: Secondary | ICD-10-CM | POA: Diagnosis not present

## 2021-09-23 DIAGNOSIS — Z9181 History of falling: Secondary | ICD-10-CM

## 2021-09-23 DIAGNOSIS — G8929 Other chronic pain: Secondary | ICD-10-CM

## 2021-09-23 NOTE — Therapy (Signed)
[place] [facility] [address], [place], [address] Phone: [phone]   Fax:  [phone]  Physical Therapy Treatment / Discharge Summary Dates of reporting from 07/31/2021 - 09/23/2021  Patient Details  Name: Rita Cruz MRN: [id number] Date of Birth: 07-02-1941 Referring Provider (PT): Allene Dillon, NP   Encounter Date: 09/23/2021   PT End of Session - 09/23/21 1739     Visit Number 10    Number of Visits 24    Date for PT Re-Evaluation 10/23/21    Authorization Type [employer] reporting period from 07/31/2021    Progress Note Due on Visit 10    PT Start Time 1732    PT Stop Time 1802    PT Time Calculation (min) 30 min    Equipment Utilized During Treatment --    Activity Tolerance Patient tolerated treatment well    Behavior During Therapy [facility] for tasks assessed/performed             Past Medical History:  Diagnosis Date   Adenomatous polyps    Anxiety    Arthritis    neck   Bronchitis 02/04/2018   Capsulitis of toe of right foot    Chest pain    Adenosine Myoview in 2006 done for atypical chest pain, showed no eviden e for ischemia or infarct   Colon polyps    GERD (gastroesophageal reflux disease)    History of colonic polyps    HTN (hypertension)    Hyperlipidemia    PUD (peptic ulcer disease)    with hx of H. Pylori    Past Surgical History:  Procedure Laterality Date   ABDOMINAL HYSTERECTOMY     BREAST BIOPSY Right 2011   benign mass done in Dr. [place] office   BREAST BIOPSY Right 06/29/2015   top hat marker. PROLIFERATIVE FIBROCYSTIC CHANGE WITH ASSOCIATED    CERVICAL DISCECTOMY     CHOLECYSTECTOMY     COLONOSCOPY WITH PROPOFOL N/A 10/06/2016   Procedure: COLONOSCOPY WITH PROPOFOL;  Surgeon: Lollie Sails, MD;  Location: [facility] ENDOSCOPY;  Service: Endoscopy;  Laterality: N/A;   COLONOSCOPY WITH PROPOFOL N/A 12/30/2018   Procedure: COLONOSCOPY WITH PROPOFOL;   Surgeon: Lollie Sails, MD;  Location: [facility] ENDOSCOPY;  Service: Endoscopy;  Laterality: N/A;   COLONOSCOPY WITH PROPOFOL N/A 01/30/2020   Procedure: COLONOSCOPY WITH PROPOFOL;  Surgeon: Lucilla Lame, MD;  Location: [facility] ENDOSCOPY;  Service: Endoscopy;  Laterality: N/A;   EYE SURGERY     FRACTURE SURGERY Right 05/21/2020   [place] the [place]. Proximal humerus fracture   hysterectomy-unspecified area     LAPAROSCOPIC RIGHT COLECTOMY Right 01/23/2019   Procedure: LAPAROSCOPIC RIGHT COLECTOMY;  Surgeon: Robert Bellow, MD;  Location: [facility] ORS;  Service: General;  Laterality: Right;    There were no vitals filed for this visit.   Subjective Assessment - 09/23/21 1736     Subjective Patient reports her whole body has been sore for the last couple of days after she got her COVID19 booster. States she is feeling better today and reports no pain upon arrival. She missed her last PT appointment because she took her husband to the ED and spent 10 hours there and was up until 4pm which was really stressful to her. Patient has not had much pain except for after the COVD19 shot. She state she feels ready to discharge to independent management with a HEP.    Pertinent History Patient is a 80 y.o.  female who presents to outpatient physical therapy with a referral for medical diagnosis lumbar degenerative disc disease, lumbar radiculitis, osteoarthritis of right hip. This patient's chief complaints consist of pain at the low back, right hip and lateral LE and trouble with balance, leading to the following functional deficits: difficulty with usual activities and mobility including getting up from kneeling on the floor, bending over, getting up from the chair, stairs, caring for husband, cleaning, etc.  Relevant past medical history and comorbidities include anxiety, arthritis, HTN, GERD, peptic ulcer disease, cholecystectomy, adematous polyps, capsulitis of toe of right foot, right humeral fracture surgery  (05/21/2020), colectomy, cervical discectomy, osteopenia.    Limitations Lifting;House hold activities;Other (comment)   getting up from kneeling on the floor, bending over, getting up from the chair,   Diagnostic tests Pelvis MRI report 06/05/2021:   "IMPRESSION:  Negative for acute or focal bony abnormality.  Septated cystic lesion posterior to the right acetabulum is likely a  paralabral cyst indicative of a labral tear. Could also be a  ganglion cyst. There is moderate right hip osteoarthritis." Lumbar spine x-ray from [facility] clinic dated 05/21/2021 revealed minimal compression of L1 and L2 could not be can excluded diffuse osteopenia was noted bladder stones could not be excluded.  Sacral and pelvis x-rays from [facility] clinic dated 05/21/2021 radiology noted that a subtle nondisplaced fracture of the lower sacrum cannot be excluded  Right hip x-ray from [facility] clinic dated 05/21/2021 revealed bilateral degenerative changes    Patient Stated Goals Would like to strengthen legs and maybe get rid of the pain in her hip/back    Currently in Pain? No/denies    Pain Onset --            OBJECTIVE  SELF-REPORTED FUNCTION FOTO score: 83/100 (lumbar spine questionnaire)  SPINE MOTION   Lumbar AROM:  *Indicates pain Flexion: = fingers to one inch proximal to toes, pulling in back and legs.  Extension: = 75% pain in mid back Rotation:  B = WF: Side Flexion: R = WFL except ipsilateral discomfort in back, L = WFL except ipsilateral discomfort in back.   MUSCLE PERFORMANCE (MMT):  *Indicates pain 07/31/21 09/23/21  Joint/Motion R/L R/L  Hip      Flexion 4*/4+* 4+/4+  Abduction 4/4 5/4+  Extension (knee ext)  4+/4+  Knee      Extension 4+/5 /  Flexion 4-/4 /  Ankle/Foot      Dorsiflexion  4/4 /  Great toe extension 4+/4+ /  Eversion 5/5 /  Comments:   07/31/21: pain in back with hip abduction   FUNCTIONAL/BALANCE TESTS: Five Time Sit to Stand (5TSTS): 12.49 seconds from 18.5 inch plinth  with no UE support or bracing back of knees.   TREATMENT:    Therapeutic exercise: to centralize symptoms and improve ROM, strength, muscular endurance, and activity tolerance required for successful completion of functional activities.  - NuStep level 5 using bilateral lower extremities only. Seat setting 11. For improved extremity mobility, muscular endurance, and activity tolerance; and to induce the analgesic effect of aerobic exercise, stimulate improved joint nutrition, and prepare body structures and systems for following interventions. X 7:21 minutes. Average SPM = 98 - sit <> stand from 18.5 inch surface for time, 3x5 plus several other reps (~10) to assess progress and improved LE/functional strength and power.  - standing lumbar AROM measurement to assess progress - see above - MMT of hips to assess progress - see above - review of HEP with eduation.  Pt required multimodal cuing for proper technique and to facilitate improved neuromuscular control, strength, range of motion, and functional ability resulting in improved performance and form.     HOME EXERCISE PROGRAM Access Code: [email or web address] URL: [email or web address] Date: 09/16/2021 Prepared by: Rosita Kea   Exercises Supine Posterior Pelvic Tilt - 1 x daily - 1 sets - 5-10 reps - 10 breaths hold Bridge with Hip Abduction and Resistance - Ground Touches - 1 x daily - 1 sets - 20 reps - 5 second hold hold Sit to Stand Without Arm Support - 1 x daily - 2-3 sets - 10 reps Sitting Knee Extension with Resistance - 1 x daily - 3 sets - 15-20 reps Standing Single Leg Stance with Counter Support - 1 x daily - 3 sets - 30 seconds hold   PT Education - 09/23/21 1809     Education Details Exercise purpose/form. Self management techniques. discharge reccomendations.    Person(s) Educated Patient    Methods Explanation;Demonstration    Comprehension Verbalized understanding;Returned demonstration              PT  Short Term Goals - 08/26/21 1305       PT SHORT TERM GOAL #1   Title Be independent with initial home exercise program for self-management of symptoms.    Baseline Initial HEP provided at IE (07/31/2021);    Time 2    Period Weeks    Status Achieved    Target Date 08/14/21               PT Long Term Goals - 09/23/21 1741       PT LONG TERM GOAL #1   Title Be independent with a long-term home exercise program for self-management of symptoms.    Baseline Initial HEP provided at IE (07/31/2021);    Time 12    Period Weeks    Status Achieved   TARGE DATE FOR ALL LONG TERM GOALS: 10/23/2021     PT LONG TERM GOAL #2   Title Demonstrate improved FOTO to equal or greater than 58 by visit #12 to demonstrate improvement in overall condition and self-reported functional ability.    Baseline 46 (07/31/2021); 83 (09/23/2021);    Time 12    Period Weeks    Status Achieved      PT LONG TERM GOAL #3   Title Patient will complete 5 Time Sit to Stand test in equal or less than 15 seconds from 18.5 inch surface with no use of B UE to demonstrate decreased fall risk and improved functional strength for transfers and household chores.    Baseline 19 seconds from 18.5 inch plinth with B UE support and bracing from back of knees. 2 failed attempts, and one with knees failing to fully extend. (07/31/2021); 12.49 seconds with no UE support (09/23/2021);    Time 12    Period Weeks    Status Achieved      PT LONG TERM GOAL #4   Title Improve B hip strength to equal or greater than 4+/5 with no increase in pain to improve patient's ability to complete  valued functional tasks such as caring for her husband, lifting, carrying with less difficulty    Baseline as low as 4/5 and painful - see objective (07/31/2021);    Time 12    Period Weeks    Status Achieved      PT LONG TERM GOAL #5   Title Be independent with a long-term home exercise program for self-management  of symptoms.    Baseline Functional  Limitations: usual activities and mobility including getting up from kneeling on the floor, bending over, getting up from the chair, stairs, caring for husband, cleaning, etc (07/31/2021); cleaning and getting up from chair is better than before, she can get up from kneeling on the floor, she can bend over now and no problem completing stairs if she holds on (09/23/2021);    Time 12    Period Weeks    Status Partially Met                   Plan - 09/23/21 1809     Clinical Impression Statement Patient has attended 10 physical therapy sessions this episode of care and has made excellent progress or met all goals. She has improved her 5 Time Sit To Stand test from 19 seconds with use of BUE to less than 13 seconds with no UE support, improved hip strength to 4+/5 or more, and improved self reported function FOTO score from 46 to 83. Patient is independent with long term HEP and is now discharged to independent management of her condition.    Personal Factors and Comorbidities Past/Current Experience;Fitness;Time since onset of injury/illness/exacerbation;Comorbidity 3+;Age    Comorbidities Relevant past medical history and comorbidities include anxiety, arthritis, HTN, GERD, peptic ulcer disease, cholecystectomy, adematous polyps, capsulitis of toe of right foot, right humeral fracture surgery (05/21/2020), colectomy, cervical discectomy, osteopenia.    Examination-Activity Limitations Caring for Others;Bend;Bed Mobility;Squat;Stairs;Lift;Locomotion Level;Sleep;Sit    Examination-Participation Restrictions Community Activity;Shop;Laundry;Yard Work;Interpersonal Relationship   usual activities and mobility including getting up from kneeling on the floor, bending over, getting up from the chair, stairs, caring for husband, cleaning, etc.   Stability/Clinical Decision Making Stable/Uncomplicated    Rehab Potential Good    PT Frequency 2x / week    PT Duration 12 weeks    PT Treatment/Interventions  ADLs/Self Care Home Management;Moist Heat;Cryotherapy;DME Instruction;Gait training;Stair training;Functional mobility training;Neuromuscular re-education;Balance training;Therapeutic exercise;Therapeutic activities;Patient/family education;Manual techniques;Dry needling;Passive range of motion;Joint Manipulations;Energy conservation    PT Next Visit Plan Updated HEP as appropriate, LE, core, and funcitonal strengthening as tolerated    PT Home Exercise Plan Medbridge Access Code: [email or web address]    Consulted and Agree with Plan of Care Patient             Patient will benefit from skilled therapeutic intervention in order to improve the following deficits and impairments:  Improper body mechanics, Pain, Decreased mobility, Decreased activity tolerance, Decreased endurance, Decreased range of motion, Decreased strength, Hypomobility, Impaired perceived functional ability, Decreased balance, Difficulty walking  Visit Diagnosis: Chronic bilateral low back pain with right-sided sciatica  Pain in right hip  Muscle weakness (generalized)  History of falling     Problem List Patient Active Problem List   Diagnosis Date Noted   Change in bowel habits 07/21/2021   Personal history of colonic polyps    Polyp of descending colon    Overweight 01/24/2020   Osteopenia of multiple sites 01/24/2020   Pelvic organ prolapse quantification stage 1 cystocele 07/25/2019   Female stress incontinence 07/25/2019   Tubulovillous adenoma polyp of colon 01/23/2019   Adenomatous polyp of ascending colon 01/18/2019   Microcalcification of both breasts on mammogram 02/04/2018   Allergic rhinitis 05/23/2015   Arthropathy, lower leg 05/23/2015   Benign neoplasm of colon 05/23/2015   MDD (major depressive disorder) 05/23/2015   Essential (primary) hypertension 05/23/2015   Acid reflux 05/23/2015   Anxiety, generalized 05/23/2015   Hypercholesteremia 05/23/2015  Blood glucose elevated 05/23/2015    Symptomatic menopausal or female climacteric states 05/23/2015   Arthritis, degenerative 05/23/2015   Mixed incontinence 03/24/2013    Everlean Alstrom. Graylon Good, PT, DPT 09/23/21, 6:12 PM  [place] [facility] [address] [place], [address] Phone: [phone]   Fax:  [phone]  Name: Rita Cruz MRN: [id number] Date of Birth: 1940/11/24

## 2021-10-07 ENCOUNTER — Ambulatory Visit: Payer: PPO | Admitting: Gastroenterology

## 2021-10-07 NOTE — Progress Notes (Deleted)
Primary Care Physician: Virginia Crews, MD  Primary Gastroenterologist:  Dr. Lucilla Lame  No chief complaint on file.   HPI: Rita Cruz is a 80 y.o. female here with a history of a right hemicolectomy due to a large polyp.  The patient was then seen by me in 2021 for repeat colonoscopy.  At that time the patient had a few polyps that were removed.  The patient now comes to see me due to a change in bowel habits.  The patient was seen by her primary care physician back at the end of August and had requested to see GI because of her change in bowel habits.  The patient had reported that she was having diarrhea with some small amounts of trace blood and some mucus when she moved her bowels.  Past Medical History:  Diagnosis Date   Adenomatous polyps    Anxiety    Arthritis    neck   Bronchitis 02/04/2018   Capsulitis of toe of right foot    Chest pain    Adenosine Myoview in 2006 done for atypical chest pain, showed no eviden e for ischemia or infarct   Colon polyps    GERD (gastroesophageal reflux disease)    History of colonic polyps    HTN (hypertension)    Hyperlipidemia    PUD (peptic ulcer disease)    with hx of H. Pylori    Current Outpatient Medications  Medication Sig Dispense Refill   acetaminophen (TYLENOL) 325 MG tablet Take 650 mg by mouth every 6 (six) hours as needed for moderate pain.     ALPRAZolam (XANAX) 0.5 MG tablet TAKE 1 TABLET BY MOUTH EVERY 12 HOURS AS NEEDED -  MUST  LAST  30  DAYS 60 tablet 2   aspirin 81 MG EC tablet Take 81 mg by mouth at bedtime.      atorvastatin (LIPITOR) 10 MG tablet Take 1 tablet by mouth once daily 90 tablet 1   co-enzyme Q-10 30 MG capsule Take by mouth daily. Unsure dose     fluticasone (FLONASE) 50 MCG/ACT nasal spray Place 2 sprays into both nostrils daily as needed for allergies. 48 g 3   GENTEAL TEARS 0.1-0.3 % SOLN as needed.      losartan (COZAAR) 100 MG tablet Take 1 tablet (100 mg total) by mouth  daily. 90 tablet 1   omeprazole (PRILOSEC) 20 MG capsule Take 1 capsule by mouth once daily 90 capsule 0   sertraline (ZOLOFT) 100 MG tablet Take 1.5 tablets (150 mg total) by mouth daily. 135 tablet 3   No current facility-administered medications for this visit.    Allergies as of 10/07/2021 - Review Complete 09/23/2021  Allergen Reaction Noted   Morphine and related Other (See Comments) 06/07/2019   Percocet  [oxycodone-acetaminophen] Other (See Comments) 05/23/2015   Levaquin [levofloxacin in d5w] Diarrhea 07/10/2015   Lisinopril  10/25/2017   Meloxicam  06/25/2016   Penicillins Itching 05/23/2015   Preservision areds 2 [multiple vitamins-minerals] Nausea Only 12/07/2017    ROS:  General: Negative for anorexia, weight loss, fever, chills, fatigue, weakness. ENT: Negative for hoarseness, difficulty swallowing , nasal congestion. CV: Negative for chest pain, angina, palpitations, dyspnea on exertion, peripheral edema.  Respiratory: Negative for dyspnea at rest, dyspnea on exertion, cough, sputum, wheezing.  GI: See history of present illness. GU:  Negative for dysuria, hematuria, urinary incontinence, urinary frequency, nocturnal urination.  Endo: Negative for unusual weight change.    Physical Examination:  There were no vitals taken for this visit.  General: Well-nourished, well-developed in no acute distress.  Eyes: No icterus. Conjunctivae pink. Lungs: Clear to auscultation bilaterally. Non-labored. Heart: Regular rate and rhythm, no murmurs rubs or gallops.  Abdomen: Bowel sounds are normal, nontender, nondistended, no hepatosplenomegaly or masses, no abdominal bruits or hernia , no rebound or guarding.   Extremities: No lower extremity edema. No clubbing or deformities. Neuro: Alert and oriented x 3.  Grossly intact. Skin: Warm and dry, no jaundice.   Psych: Alert and cooperative, normal mood and affect.  Labs:    Imaging Studies: No results found.  Assessment  and Plan:   Rita Cruz is a 80 y.o. y/o female ***     Lucilla Lame, MD. Marval Regal    Note: This dictation was prepared with Dragon dictation along with smaller phrase technology. Any transcriptional errors that result from this process are unintentional.

## 2021-11-12 ENCOUNTER — Other Ambulatory Visit: Payer: Self-pay | Admitting: Family Medicine

## 2021-11-12 NOTE — Telephone Encounter (Signed)
Requested Prescriptions  Pending Prescriptions Disp Refills   omeprazole (PRILOSEC) 20 MG capsule [Pharmacy Med Name: Omeprazole 20 MG Oral Capsule Delayed Release] 90 capsule 0    Sig: Take 1 capsule by mouth once daily     Gastroenterology: Proton Pump Inhibitors Passed - 11/12/2021 12:47 PM      Passed - Valid encounter within last 12 months    Recent Outpatient Visits          2 months ago Essential (primary) hypertension   [facility], Dionne Bucy, MD   3 months ago Encounter for annual physical exam   [facility] [place], Dionne Bucy, MD   10 months ago Essential (primary) hypertension   [facility], Dionne Bucy, MD   1 year ago Essential (primary) hypertension   [facility], Dionne Bucy, MD   1 year ago Encounter for annual physical exam   [facility] [place], Dionne Bucy, MD      Future Appointments            In 4 months [place], Dionne Bucy, MD [facility], [place]   In 7 months Bac[place], Dionne Bucy, MD [facility], [place]

## 2021-11-14 DIAGNOSIS — B9689 Other specified bacterial agents as the cause of diseases classified elsewhere: Secondary | ICD-10-CM | POA: Diagnosis not present

## 2021-11-14 DIAGNOSIS — J019 Acute sinusitis, unspecified: Secondary | ICD-10-CM | POA: Diagnosis not present

## 2021-11-14 DIAGNOSIS — U071 COVID-19: Secondary | ICD-10-CM | POA: Diagnosis not present

## 2021-11-14 DIAGNOSIS — J209 Acute bronchitis, unspecified: Secondary | ICD-10-CM | POA: Diagnosis not present

## 2021-11-14 DIAGNOSIS — Z03818 Encounter for observation for suspected exposure to other biological agents ruled out: Secondary | ICD-10-CM | POA: Diagnosis not present

## 2021-11-14 DIAGNOSIS — L0100 Impetigo, unspecified: Secondary | ICD-10-CM | POA: Diagnosis not present

## 2021-11-14 DIAGNOSIS — R6889 Other general symptoms and signs: Secondary | ICD-10-CM | POA: Diagnosis not present

## 2021-12-22 ENCOUNTER — Telehealth: Payer: Self-pay | Admitting: Family Medicine

## 2021-12-22 DIAGNOSIS — H353131 Nonexudative age-related macular degeneration, bilateral, early dry stage: Secondary | ICD-10-CM | POA: Diagnosis not present

## 2021-12-22 DIAGNOSIS — F418 Other specified anxiety disorders: Secondary | ICD-10-CM

## 2021-12-22 NOTE — Telephone Encounter (Signed)
Requested medication (s) are due for refill today: Yes  Requested medication (s) are on the active medication list: Yes  Last refill:  09/20/21  Future visit scheduled: Yes  Notes to clinic:  See request.    Requested Prescriptions  Pending Prescriptions Disp Refills   ALPRAZolam (XANAX) 0.5 MG tablet [Pharmacy Med Name: ALPRAZolam 0.5 MG Oral Tablet] 60 tablet 0    Sig: TAKE 1 TABLET BY MOUTH EVERY 12 HOURS AS NEEDED -  MUST  LAST  6  DAYS     Not Delegated - Psychiatry:  Anxiolytics/Hypnotics Failed - 12/22/2021  5:31 AM      Failed - This refill cannot be delegated      Failed - Urine Drug Screen completed in last 360 days      Passed - Valid encounter within last 6 months    Recent Outpatient Visits           3 months ago Essential (primary) hypertension   [facility], Dionne Bucy, MD   5 months ago Encounter for annual physical exam   [facility] [place], Dionne Bucy, MD   11 months ago Essential (primary) hypertension   [facility], Dionne Bucy, MD   1 year ago Essential (primary) hypertension   [facility] [place], Dionne Bucy, MD   1 year ago Encounter for annual physical exam   [facility] [place], Dionne Bucy, MD       Future Appointments             In 2 months Bac[place]al[place], Dionne Bucy, MD [facility], [place]   In 6 months Bacigalupo, Dionne Bucy, MD [facility]            P

## 2021-12-22 NOTE — Telephone Encounter (Signed)
Pt was advised to call for refill for ALPRAZolam Rita Cruz) 0.5 MG tablet / pt states she has enough to get her through today and will need refill tomorrow / please advise

## 2021-12-23 NOTE — Telephone Encounter (Addendum)
Patient called on number listed in chart and number she called from [phone], left VM to return the call to the office to discuss refill request. Medication sent to the pharmacy on 12/22/21.  ALPRAZolam (XANAX) 0.5 MG tablet 60 tablet 2 12/22/2021    Sig: TAKE 1 TABLET BY MOUTH EVERY 12 HOURS AS NEEDED -  MUST  LAST  30  DAYS   Sent to pharmacy as: ALPRAZolam Duanne Moron) 0.5 MG tablet   Notes to Pharmacy: Do not fill <30 days from last refill   E-Prescribing Status: Receipt confirmed by pharmacy (12/22/2021  3:43 PM EST)   Pharmacy  [facility] [address] [place]  [address] [place] [address]  Phone:  [phone]  Fax:  [phone]

## 2021-12-23 NOTE — Telephone Encounter (Signed)
Patient called in about status of ALPRAZolam (XANAX) 0.5 MG tablet and why it seems to be denyn, says non delegated. Please call back

## 2021-12-23 NOTE — Telephone Encounter (Signed)
Patient is all out, please send shortly supply, ALPRAZolam (XANAX) 0.5 MG tablet

## 2022-01-06 ENCOUNTER — Other Ambulatory Visit: Payer: Self-pay

## 2022-01-06 ENCOUNTER — Ambulatory Visit (INDEPENDENT_AMBULATORY_CARE_PROVIDER_SITE_OTHER): Payer: PPO

## 2022-01-06 VITALS — BP 128/70 | HR 87 | Temp 97.9°F | Ht 69.0 in | Wt 175.4 lb

## 2022-01-06 DIAGNOSIS — Z0000 Encounter for general adult medical examination without abnormal findings: Secondary | ICD-10-CM | POA: Diagnosis not present

## 2022-01-06 DIAGNOSIS — I8390 Asymptomatic varicose veins of unspecified lower extremity: Secondary | ICD-10-CM | POA: Insufficient documentation

## 2022-01-06 DIAGNOSIS — E785 Hyperlipidemia, unspecified: Secondary | ICD-10-CM | POA: Insufficient documentation

## 2022-01-06 DIAGNOSIS — Z90710 Acquired absence of both cervix and uterus: Secondary | ICD-10-CM | POA: Insufficient documentation

## 2022-01-06 DIAGNOSIS — Z9841 Cataract extraction status, right eye: Secondary | ICD-10-CM | POA: Insufficient documentation

## 2022-01-06 DIAGNOSIS — S42309A Unspecified fracture of shaft of humerus, unspecified arm, initial encounter for closed fracture: Secondary | ICD-10-CM | POA: Insufficient documentation

## 2022-01-06 DIAGNOSIS — Z981 Arthrodesis status: Secondary | ICD-10-CM | POA: Insufficient documentation

## 2022-01-06 DIAGNOSIS — N393 Stress incontinence (female) (male): Secondary | ICD-10-CM | POA: Insufficient documentation

## 2022-01-06 DIAGNOSIS — K589 Irritable bowel syndrome without diarrhea: Secondary | ICD-10-CM | POA: Insufficient documentation

## 2022-01-06 DIAGNOSIS — Z9889 Other specified postprocedural states: Secondary | ICD-10-CM

## 2022-01-06 DIAGNOSIS — Z9842 Cataract extraction status, left eye: Secondary | ICD-10-CM | POA: Insufficient documentation

## 2022-01-06 DIAGNOSIS — I10 Essential (primary) hypertension: Secondary | ICD-10-CM | POA: Insufficient documentation

## 2022-01-06 DIAGNOSIS — Z9049 Acquired absence of other specified parts of digestive tract: Secondary | ICD-10-CM | POA: Insufficient documentation

## 2022-01-06 HISTORY — DX: Other specified postprocedural states: Z98.890

## 2022-01-06 NOTE — Patient Instructions (Addendum)
Rita Cruz , Thank you for taking time to come for your Medicare Wellness Visit. I appreciate your ongoing commitment to your health goals. Please review the following plan we discussed and let me know if I can assist you in the future.   Screening recommendations/referrals: Colonoscopy: 01/30/20 Mammogram: 08/20/21 Bone Density: 04/27/19 Recommended yearly ophthalmology/optometry visit for glaucoma screening and checkup Recommended yearly dental visit for hygiene and checkup  Vaccinations: Influenza vaccine: 09/02/21 Pneumococcal vaccine: 05/17/14 Tdap vaccine: 04/11/12 Shingles vaccine: Shingrix 06/14/19, 09/07/19   Covid-19:01/08/20, 02/06/20, 08/27/20, 09/19/21  Advanced directives: no  Conditions/risks identified: none  Next appointment: Follow up in one year for your annual wellness visit - 01/07/23 @ 11am in person   Preventive Care 65 Years and Older, Female Preventive care refers to lifestyle choices and visits with your health care provider that can promote health and wellness. What does preventive care include? A yearly physical exam. This is also called an annual well check. Dental exams once or twice a year. Routine eye exams. Ask your health care provider how often you should have your eyes checked. Personal lifestyle choices, including: Daily care of your teeth and gums. Regular physical activity. Eating a healthy diet. Avoiding tobacco and drug use. Limiting alcohol use. Practicing safe sex. Taking low-dose aspirin every day. Taking vitamin and mineral supplements as recommended by your health care provider. What happens during an annual well check? The services and screenings done by your health care provider during your annual well check will depend on your age, overall health, lifestyle risk factors, and family history of disease. Counseling  Your health care provider may ask you questions about your: Alcohol use. Tobacco use. Drug use. Emotional  well-being. Home and relationship well-being. Sexual activity. Eating habits. History of falls. Memory and ability to understand (cognition). Work and work Statistician. Reproductive health. Screening  You may have the following tests or measurements: Height, weight, and BMI. Blood pressure. Lipid and cholesterol levels. These may be checked every 5 years, or more frequently if you are over 20 years old. Skin check. Lung cancer screening. You may have this screening every year starting at age 41 if you have a 30-pack-year history of smoking and currently smoke or have quit within the past 15 years. Fecal occult blood test (FOBT) of the stool. You may have this test every year starting at age 67. Flexible sigmoidoscopy or colonoscopy. You may have a sigmoidoscopy every 5 years or a colonoscopy every 10 years starting at age 58. Hepatitis C blood test. Hepatitis B blood test. Sexually transmitted disease (STD) testing. Diabetes screening. This is done by checking your blood sugar (glucose) after you have not eaten for a while (fasting). You may have this done every 1-3 years. Bone density scan. This is done to screen for osteoporosis. You may have this done starting at age 58. Mammogram. This may be done every 1-2 years. Talk to your health care provider about how often you should have regular mammograms. Talk with your health care provider about your test results, treatment options, and if necessary, the need for more tests. Vaccines  Your health care provider may recommend certain vaccines, such as: Influenza vaccine. This is recommended every year. Tetanus, diphtheria, and acellular pertussis (Tdap, Td) vaccine. You may need a Td booster every 10 years. Zoster vaccine. You may need this after age 69. Pneumococcal 13-valent conjugate (PCV13) vaccine. One dose is recommended after age 30. Pneumococcal polysaccharide (PPSV23) vaccine. One dose is recommended after age 30. Talk to  your  health care provider about which screenings and vaccines you need and how often you need them. This information is not intended to replace advice given to you by your health care provider. Make sure you discuss any questions you have with your health care provider. Document Released: 12/06/2015 Document Revised: 07/29/2016 Document Reviewed: 09/10/2015 Elsevier Interactive Patient Education  2017 [place] Prevention in the Home Falls can cause injuries. They can happen to people of all ages. There are many things you can do to make your home safe and to help prevent falls. What can I do on the outside of my home? Regularly fix the edges of walkways and driveways and fix any cracks. Remove anything that might make you trip as you walk through a door, such as a raised step or threshold. Trim any bushes or trees on the path to your home. Use bright outdoor lighting. Clear any walking paths of anything that might make someone trip, such as rocks or tools. Regularly check to see if handrails are loose or broken. Make sure that both sides of any steps have handrails. Any raised decks and porches should have guardrails on the edges. Have any leaves, snow, or ice cleared regularly. Use sand or salt on walking paths during winter. Clean up any spills in your garage right away. This includes oil or grease spills. What can I do in the bathroom? Use night lights. Install grab bars by the toilet and in the tub and shower. Do not use towel bars as grab bars. Use non-skid mats or decals in the tub or shower. If you need to sit down in the shower, use a plastic, non-slip stool. Keep the floor dry. Clean up any water that spills on the floor as soon as it happens. Remove soap buildup in the tub or shower regularly. Attach bath mats securely with double-sided non-slip rug tape. Do not have throw rugs and other things on the floor that can make you trip. What can I do in the bedroom? Use night  lights. Make sure that you have a light by your bed that is easy to reach. Do not use any sheets or blankets that are too big for your bed. They should not hang down onto the floor. Have a firm chair that has side arms. You can use this for support while you get dressed. Do not have throw rugs and other things on the floor that can make you trip. What can I do in the kitchen? Clean up any spills right away. Avoid walking on wet floors. Keep items that you use a lot in easy-to-reach places. If you need to reach something above you, use a strong step stool that has a grab bar. Keep electrical cords out of the way. Do not use floor polish or wax that makes floors slippery. If you must use wax, use non-skid floor wax. Do not have throw rugs and other things on the floor that can make you trip. What can I do with my stairs? Do not leave any items on the stairs. Make sure that there are handrails on both sides of the stairs and use them. Fix handrails that are broken or loose. Make sure that handrails are as long as the stairways. Check any carpeting to make sure that it is firmly attached to the stairs. Fix any carpet that is loose or worn. Avoid having throw rugs at the top or bottom of the stairs. If you do have throw rugs, attach  them to the floor with carpet tape. Make sure that you have a light switch at the top of the stairs and the bottom of the stairs. If you do not have them, ask someone to add them for you. What else can I do to help prevent falls? Wear shoes that: Do not have high heels. Have rubber bottoms. Are comfortable and fit you well. Are closed at the toe. Do not wear sandals. If you use a stepladder: Make sure that it is fully opened. Do not climb a closed stepladder. Make sure that both sides of the stepladder are locked into place. Ask someone to hold it for you, if possible. Clearly mark and make sure that you can see: Any grab bars or handrails. First and last  steps. Where the edge of each step is. Use tools that help you move around (mobility aids) if they are needed. These include: Canes. Walkers. Scooters. Crutches. Turn on the lights when you go into a dark area. Replace any light bulbs as soon as they burn out. Set up your furniture so you have a clear path. Avoid moving your furniture around. If any of your floors are uneven, fix them. If there are any pets around you, be aware of where they are. Review your medicines with your doctor. Some medicines can make you feel dizzy. This can increase your chance of falling. Ask your doctor what other things that you can do to help prevent falls. This information is not intended to replace advice given to you by your health care provider. Make sure you discuss any questions you have with your health care provider. Document Released: 09/05/2009 Document Revised: 04/16/2016 Document Reviewed: 12/14/2014 Elsevier Interactive Patient Education  2017 [employer].

## 2022-01-06 NOTE — Progress Notes (Signed)
Subjective:   Rita Cruz is a 81 y.o. female who presents for Medicare Annual (Subsequent) preventive examination.  Review of Systems           Objective:    Today's Vitals   01/06/22 1117  BP: 128/70  Pulse: 87  Temp: 97.9 F (36.6 C)  TempSrc: Oral  SpO2: 97%  Weight: 175 lb 6.4 oz (79.6 kg)  Height: 5\' 9"  (1.753 m)   Body mass index is 25.9 kg/m.  Advanced Directives 08/05/2021 12/31/2020 01/30/2020 12/26/2019 01/23/2019 01/23/2019 01/19/2019  Does Patient Have a Medical Advance Directive? Yes Yes Yes Yes Yes Yes Yes  Type of Advance Directive - Combine;Living will - [place];Living will Healthcare Power of [place] of [place]  Does patient want to make changes to medical advance directive? No - Patient declined - - - No - Patient declined - No - Patient declined  Copy of [place] in Chart? - Yes - validated most recent copy scanned in chart (See row information) - Yes - validated most recent copy scanned in chart (See row information) Yes - validated most recent copy scanned in chart (See row information) Yes - validated most recent copy scanned in chart (See row information) Yes - validated most recent copy scanned in chart (See row information)  Would patient like information on creating a medical advance directive? - - - - - - -    Current Medications (verified) Outpatient Encounter Medications as of 01/06/2022  Medication Sig   acetaminophen (TYLENOL) 325 MG tablet Take 650 mg by mouth every 6 (six) hours as needed for moderate pain.   ALPRAZolam (XANAX) 0.5 MG tablet TAKE 1 TABLET BY MOUTH EVERY 12 HOURS AS NEEDED -  MUST  LAST  30  DAYS   aspirin 81 MG EC tablet Take 81 mg by mouth at bedtime.    atorvastatin (LIPITOR) 10 MG tablet Take 1 tablet by mouth once daily   benzonatate (TESSALON) 200 MG capsule Take 200 mg by mouth 3 (three) times daily as needed.    co-enzyme Q-10 30 MG capsule Take by mouth daily. Unsure dose   fluticasone (FLONASE) 50 MCG/ACT nasal spray Place 2 sprays into both nostrils daily as needed for allergies.   GENTEAL TEARS 0.1-0.3 % SOLN as needed.    losartan (COZAAR) 100 MG tablet Take 1 tablet (100 mg total) by mouth daily.   omeprazole (PRILOSEC) 20 MG capsule Take 1 capsule by mouth once daily   PFIZER COVID-19 VAC BIVALENT injection    sertraline (ZOLOFT) 100 MG tablet Take 1.5 tablets (150 mg total) by mouth daily.   No facility-administered encounter medications on file as of 01/06/2022.    Allergies (verified) Morphine and related, Percocet  [oxycodone-acetaminophen], Levaquin [levofloxacin in d5w], Lisinopril, Meloxicam, Penicillins, and Preservision areds 2 [multiple vitamins-minerals]   History: Past Medical History:  Diagnosis Date   Adenomatous polyps    Anxiety    Arthritis    neck   Bronchitis 02/04/2018   Capsulitis of toe of right foot    Chest pain    Adenosine Myoview in 2006 done for atypical chest pain, showed no eviden e for ischemia or infarct   Colon polyps    GERD (gastroesophageal reflux disease)    History of colonic polyps    HTN (hypertension)    Hyperlipidemia    Postoperative nausea and vomiting 01/06/2022   PUD (peptic ulcer disease)  with hx of H. Pylori   Past Surgical History:  Procedure Laterality Date   ABDOMINAL HYSTERECTOMY     BREAST BIOPSY Right 2011   benign mass done in Dr. [place] office   BREAST BIOPSY Right 06/29/2015   top hat marker. PROLIFERATIVE FIBROCYSTIC CHANGE WITH ASSOCIATED    CERVICAL DISCECTOMY     CHOLECYSTECTOMY     COLONOSCOPY WITH PROPOFOL N/A 10/06/2016   Procedure: COLONOSCOPY WITH PROPOFOL;  Surgeon: Lollie Sails, MD;  Location: [facility] ENDOSCOPY;  Service: Endoscopy;  Laterality: N/A;   COLONOSCOPY WITH PROPOFOL N/A 12/30/2018   Procedure: COLONOSCOPY WITH PROPOFOL;  Surgeon: Lollie Sails, MD;  Location: [facility] ENDOSCOPY;  Service:  Endoscopy;  Laterality: N/A;   COLONOSCOPY WITH PROPOFOL N/A 01/30/2020   Procedure: COLONOSCOPY WITH PROPOFOL;  Surgeon: Lucilla Lame, MD;  Location: [facility] ENDOSCOPY;  Service: Endoscopy;  Laterality: N/A;   EYE SURGERY     FRACTURE SURGERY Right 05/21/2020   [place]. Proximal humerus fracture   hysterectomy-unspecified area     LAPAROSCOPIC RIGHT COLECTOMY Right 01/23/2019   Procedure: LAPAROSCOPIC RIGHT COLECTOMY;  Surgeon: Robert Bellow, MD;  Location: [facility] ORS;  Service: General;  Laterality: Right;   Family History  Problem Relation Age of Onset   Kidney failure Father    Heart failure Father    Diabetes Father    COPD Mother    Colon cancer Mother 18   Heart attack Mother    Ulcers Mother    Osteoporosis Mother    Macular degeneration Mother    Hypertension Brother    Irritable bowel syndrome Daughter    Colon polyps Daughter    Kidney Stones Son    Bipolar disorder Son    Melanoma Son        stage IV   Hypertension Son    Kidney cancer Other    Breast cancer Neg Hx    Social History   Socioeconomic History   Marital status: Married    Spouse name: Not on file   Number of children: 2   Years of education: Not on file   Highest education level: Some college, no degree  Occupational History   Occupation: retired  Tobacco Use   Smoking status: Never   Smokeless tobacco: Never   Tobacco comments:    does not smoke  Vaping Use   Vaping Use: Never used  Substance and Sexual Activity   Alcohol use: Yes    Alcohol/week: 7.0 standard drinks    Types: 7 Glasses of wine per week    Comment: 1 glass of wine nightly   Drug use: No   Sexual activity: Not Currently    Birth control/protection: Surgical    Comment: Hysterectomy  Other Topics Concern   Not on file  Social History Narrative   Married-2nd marriage. She has 2 children and her husband has 2 children.    Social Determinants of Health   Financial Resource Strain: Not on file  Food  Insecurity: Not on file  Transportation Needs: Not on file  Physical Activity: Not on file  Stress: Not on file  Social Connections: Not on file    Tobacco Counseling Counseling given: Not Answered Tobacco comments: does not smoke   Clinical Intake:  Pre-visit preparation completed: Yes  Pain : No/denies pain     Nutritional Risks: None Diabetes: No  How often do you need to have someone help you when you read instructions, pamphlets, or other written materials from your  doctor or pharmacy?: 1 - Never  Diabetic?no  Interpreter Needed?: No  Information entered by :: Kirke Shaggy, LPN   Activities of Daily Living In your present state of health, do you have any difficulty performing the following activities: 07/21/2021 01/16/2021  Hearing? N N  Vision? N N  Difficulty concentrating or making decisions? N Y  Walking or climbing stairs? Y N  Dressing or bathing? N N  Doing errands, shopping? N N  Some recent data might be hidden    Patient Care Team: Virginia Crews, MD as PCP - General (Family Medicine) Birder Robson, MD as Referring Physician (Ophthalmology) Sharlet Salina, MD as Referring Physician (Physical Medicine and Rehabilitation) Margaretha Sheffield, MD (Otolaryngology) Lucilla Lame, MD as Consulting Physician (Gastroenterology) Bary Castilla Forest Gleason, MD as Consulting Physician (General Surgery)  Indicate any recent Medical Services you may have received from other than [facility] providers in the past year (date may be approximate).     Assessment:   This is a routine wellness examination for Rita Cruz.  Hearing/Vision screen No results found.  Dietary issues and exercise activities discussed:     Goals Addressed   None    Depression Screen PHQ 2/9 Scores 07/21/2021 01/16/2021 12/31/2020 07/25/2020 01/23/2020 10/13/2019 07/21/2019  PHQ - 2 Score 2 1 0 0 0 1 1  PHQ- 9 Score 3 1 - 2 1 - 4    Fall Risk Fall Risk  07/21/2021 01/16/2021 12/31/2020 07/25/2020  12/26/2019  Falls in the past year? 1 1 1 1  0  Number falls in past yr: 0 0 0 0 0  Injury with Fall? 0 1 1 1  0  Risk for fall due to : History of fall(s) - - Other (Comment);Orthopedic patient -  Follow up Falls evaluation completed;Education provided - Falls prevention discussed Falls evaluation completed;Falls prevention discussed -    FALL RISK PREVENTION PERTAINING TO THE HOME:  Any stairs in or around the home? No  If so, are there any without handrails? No  Home free of loose throw rugs in walkways, pet beds, electrical cords, etc? Yes  Adequate lighting in your home to reduce risk of falls? Yes   ASSISTIVE DEVICES UTILIZED TO PREVENT FALLS:  Life alert? No  Use of a cane, walker or w/c? No  Grab bars in the bathroom? No  Shower chair or bench in shower? Yes  Elevated toilet seat or a handicapped toilet? No   TIMED UP AND GO:  Was the test performed? Yes .  Length of time to ambulate 10 feet: 4 sec.   Gait steady and fast without use of assistive device  Cognitive Function:     6CIT Screen 12/26/2019 12/20/2018 12/07/2017 12/08/2016  What Year? 0 points 0 points 0 points 0 points  What month? 0 points 0 points 0 points 0 points  What time? 0 points 0 points 0 points 0 points  Count back from 20 0 points 0 points 0 points 0 points  Months in reverse 0 points 0 points 0 points 0 points  Repeat phrase 0 points 2 points 0 points 0 points  Total Score 0 2 0 0    Immunizations Immunization History  Administered Date(s) Administered   Fluad Quad(high Dose 65+) 07/21/2019, 08/08/2020, 09/02/2021   Influenza, High Dose Seasonal PF 08/25/2017, 08/13/2018   PFIZER(Purple Top)SARS-COV-2 Vaccination 01/08/2020, 02/06/2020, 08/27/2020   Pneumococcal Conjugate-13 05/17/2014   Pneumococcal Polysaccharide-23 04/11/2012   Td 04/08/2010   Tdap 04/11/2012   Zoster Recombinat (Shingrix) 06/14/2019, 09/07/2019  Zoster, Live 02/04/2009    TDAP status: Up to date  Flu Vaccine  status: Up to date  Pneumococcal vaccine status: Up to date  Covid-19 vaccine status: Completed vaccines  Qualifies for Shingles Vaccine? Yes   Zostavax completed No   Shingrix Completed?: Yes  Screening Tests Health Maintenance  Topic Date Due   COVID-19 Vaccine (4 - Booster for Pfizer series) 10/22/2020   TETANUS/TDAP  04/11/2022   DEXA SCAN  04/26/2024   Pneumonia Vaccine 34+ Years old  Completed   INFLUENZA VACCINE  Completed   Zoster Vaccines- Shingrix  Completed   HPV VACCINES  Aged Out   COLONOSCOPY (Pts 45-67yrs Insurance coverage will need to be confirmed)  Discontinued    Health Maintenance  Health Maintenance Due  Topic Date Due   COVID-19 Vaccine (4 - Booster for [place] series) 10/22/2020    Colorectal cancer screening: Type of screening: Colonoscopy. Completed 01/30/20. Repeat every 5 years  Mammogram status: Completed 08/20/21. Repeat every year  Bone Density status: Completed 04/27/19. Results reflect: Bone density results: OSTEOPENIA. Repeat every 5 years.  Lung Cancer Screening: (Low Dose CT Chest recommended if Age 75-80 years, 30 pack-year currently smoking OR have quit w/in 15years.) does not qualify.    Additional Screening:  Hepatitis C Screening: does not qualify; Completed no  Vision Screening: Recommended annual ophthalmology exams for early detection of glaucoma and other disorders of the eye. Is the patient up to date with their annual eye exam?  Yes  Who is the provider or what is the name of the office in which the patient attends annual eye exams? [facility] If pt is not established with a provider, would they like to be referred to a provider to establish care? No .   Dental Screening: Recommended annual dental exams for proper oral hygiene  Community Resource Referral / Chronic Care Management: CRR required this visit?  No   CCM required this visit?  No      Plan:     I have personally reviewed and noted the following in  the patients chart:   Medical and social history Use of alcohol, tobacco or illicit drugs  Current medications and supplements including opioid prescriptions.  Functional ability and status Nutritional status Physical activity Advanced directives List of other physicians Hospitalizations, surgeries, and ER visits in previous 12 months Vitals Screenings to include cognitive, depression, and falls Referrals and appointments  In addition, I have reviewed and discussed with patient certain preventive protocols, quality metrics, and best practice recommendations. A written personalized care plan for preventive services as well as general preventive health recommendations were provided to patient.     Dionisio David, LPN   [id number]/81/[id number]   Nurse Notes: none

## 2022-02-12 ENCOUNTER — Other Ambulatory Visit: Payer: Self-pay | Admitting: Family Medicine

## 2022-03-09 ENCOUNTER — Encounter: Payer: Self-pay | Admitting: Family Medicine

## 2022-03-09 ENCOUNTER — Ambulatory Visit (INDEPENDENT_AMBULATORY_CARE_PROVIDER_SITE_OTHER): Payer: PPO | Admitting: Family Medicine

## 2022-03-09 VITALS — BP 140/86 | HR 75 | Temp 98.5°F | Resp 16 | Wt 173.0 lb

## 2022-03-09 DIAGNOSIS — E7800 Pure hypercholesterolemia, unspecified: Secondary | ICD-10-CM

## 2022-03-09 DIAGNOSIS — F331 Major depressive disorder, recurrent, moderate: Secondary | ICD-10-CM | POA: Diagnosis not present

## 2022-03-09 DIAGNOSIS — H6981 Other specified disorders of Eustachian tube, right ear: Secondary | ICD-10-CM

## 2022-03-09 DIAGNOSIS — E663 Overweight: Secondary | ICD-10-CM

## 2022-03-09 DIAGNOSIS — H6991 Unspecified Eustachian tube disorder, right ear: Secondary | ICD-10-CM | POA: Insufficient documentation

## 2022-03-09 DIAGNOSIS — I10 Essential (primary) hypertension: Secondary | ICD-10-CM

## 2022-03-09 DIAGNOSIS — F418 Other specified anxiety disorders: Secondary | ICD-10-CM

## 2022-03-09 DIAGNOSIS — F411 Generalized anxiety disorder: Secondary | ICD-10-CM

## 2022-03-09 MED ORDER — ALPRAZOLAM 0.5 MG PO TABS: ORAL_TABLET | ORAL | 5 refills | Status: DC

## 2022-03-09 NOTE — Assessment & Plan Note (Signed)
Well controlled Continue current medications Recheck metabolic panel F/u in 6 months  

## 2022-03-09 NOTE — Progress Notes (Signed)
?  ? ?I,Sulibeya S Dimas,acting as a scribe for [name], MD.,have documented all relevant documentation on the behalf of [name], MD,as directed by  [name], MD while in the presence of [name], MD. ? ? ?Established patient visit ? ? ?Patient: Rita Cruz   DOB: 08-19-1941   81 y.o. Female  MRN: [id number] ?Visit Date: 03/09/2022 ? ?Today's healthcare provider: [name], MD  ? ?Chief Complaint  ?Patient presents with  ? Hypertension  ? ?Subjective  ?  ?HPI  ?Hypertension, follow-up ? ?BP Readings from Last 3 Encounters:  ?03/09/22 140/86  ?01/06/22 128/70  ?09/02/21 128/64  ? Wt Readings from Last 3 Encounters:  ?03/09/22 173 lb (78.5 kg)  ?01/06/22 175 lb 6.4 oz (79.6 kg)  ?09/02/21 175 lb 4.8 oz (79.5 kg)  ?  ? ?She was last seen for hypertension 6 months ago.  ?BP at that visit was 128/64 . Management since that visit includes no changes. ? ?She reports excellent compliance with treatment. ?She is not having side effects.  ?She is following a Low Sodium diet. ? ?Use of agents associated with hypertension: none.  ? ?Outside blood pressures are not  being checked. ?Symptoms: ?No chest pain No chest pressure  ?No palpitations No syncope  ?No dyspnea No orthopnea  ?No paroxysmal nocturnal dyspnea No lower extremity edema  ? ?Pertinent labs ?Lab Results  ?Component Value Date  ? CHOL 122 01/16/2021  ? HDL 43 01/16/2021  ? [place] 57 01/16/2021  ? TRIG 124 01/16/2021  ? CHOLHDL 2.8 01/16/2021  ? Lab Results  ?Component Value Date  ? NA 144 07/23/2021  ? K 3.8 07/23/2021  ? CREATININE 0.83 07/23/2021  ? EGFR 72 07/23/2021  ? GLUCOSE 85 07/23/2021  ? TSH 1.240 01/23/2020  ?  ? ?The ASCVD Risk score (Arnett DK, et al., 2019) failed to calculate for the following reasons: ?  The 2019 ASCVD risk score is only valid for ages 74 to 29 ? ?--------------------------------------------------------------------------------------------------- ?Depression/anxiety,  Follow-up ? ?She  was last seen for this 6 months ago. ?Changes made at last visit include continue higher dose of Zoloft. ?  ?She reports excellent compliance with treatment. ?She is not having side effects.  ? ?She reports excellent tolerance of treatment. ?Current symptoms include: depressed mood ?She feels she is Unchanged since last visit. ? ? ?  03/09/2022  ?  3:38 PM 01/06/2022  ? 11:28 AM 07/21/2021  ?  3:28 PM  ?Depression screen PHQ 2/9  ?Decreased Interest 0 0 0  ?Down, Depressed, Hopeless 2 0 2  ?PHQ - 2 Score 2 0 2  ?Altered sleeping 0  0  ?Tired, decreased energy 1  1  ?Change in appetite 0  0  ?Feeling bad or failure about yourself  0  0  ?Trouble concentrating 0  0  ?Moving slowly or fidgety/restless 0  0  ?Suicidal thoughts 0  0  ?PHQ-9 Score 3  3  ?Difficult doing work/chores Not difficult at all  Not difficult at all  ?  ? ?  03/09/2022  ?  3:34 PM 07/25/2020  ? 11:51 AM 07/25/2020  ? 11:49 AM 07/21/2019  ?  3:13 PM  ?GAD 7 : Generalized Anxiety Score  ?Nervous, Anxious, on Edge _0 ?Control/stop worrying 1  0 1  ?Worry too much - different things _1 ?Trouble relaxing 0  2 0  ?Restless 0  0 0  ?Easily annoyed or irritable 0  1 1  ?Afraid - awful might happen 0  0 1  ?Total GAD 7 Score _0 ?Anxiety Difficulty Somewhat difficult Not difficult at all Not difficult at all Somewhat difficult  ? ?----------------------------------------------------------------------------------------- ? ? ?Medications: ?Outpatient Medications Prior to Visit  ?Medication Sig  ? acetaminophen (TYLENOL) 325 MG tablet Take 650 mg by mouth every 6 (six) hours as needed for moderate pain.  ? aspirin 81 MG EC tablet Take 81 mg by mouth at bedtime.   ? atorvastatin (LIPITOR) 10 MG tablet Take 1 tablet by mouth once daily  ? co-enzyme Q-10 30 MG capsule Take by mouth daily. Unsure dose  ? fluticasone (FLONASE) 50 MCG/ACT nasal spray Place 2 sprays into both nostrils daily as needed for allergies.  ? GENTEAL TEARS 0.1-0.3 %  SOLN as needed.   ? losartan (COZAAR) 100 MG tablet Take 1 tablet by mouth once daily  ? omeprazole (PRILOSEC) 20 MG capsule Take 1 capsule by mouth once daily  ? sertraline (ZOLOFT) 100 MG tablet Take 1.5 tablets (150 mg total) by mouth daily.  ? [DISCONTINUED] ALPRAZolam (XANAX) 0.5 MG tablet TAKE 1 TABLET BY MOUTH EVERY 12 HOURS AS NEEDED -  MUST  LAST  30  DAYS  ? [DISCONTINUED] benzonatate (TESSALON) 200 MG capsule Take 200 mg by mouth 3 (three) times daily as needed.  ? [DISCONTINUED] PFIZER COVID-19 VAC BIVALENT injection   ? ?No facility-administered medications prior to visit.  ? ? ?Review of Systems per HPI ? ?  ?  Objective  ?  ?BP 140/86 (BP Location: Left Arm, Patient Position: Sitting, Cuff Size: Large)   Pulse 75   Temp 98.5 ?F (36.9 ?C) (Oral)   Resp 16   Wt 173 lb (78.5 kg)   SpO2 96%   BMI 25.55 kg/m?  ?BP Readings from Last 3 Encounters:  ?03/09/22 140/86  ?01/06/22 128/70  ?09/02/21 128/64  ? ?Wt Readings from Last 3 Encounters:  ?03/09/22 173 lb (78.5 kg)  ?01/06/22 175 lb 6.4 oz (79.6 kg)  ?09/02/21 175 lb 4.8 oz (79.5 kg)  ? ?  ?Physical Exam ?Vitals reviewed.  ?Constitutional:   ?   General: She is not in acute distress. ?   Appearance: Normal appearance. She is well-developed. She is not diaphoretic.  ?HENT:  ?   Head: Normocephalic and atraumatic.  ?   Right Ear: Tympanic membrane, ear canal and external ear normal.  ?   Left Ear: Tympanic membrane, ear canal and external ear normal.  ?Eyes:  ?   General: No scleral icterus. ?   Conjunctiva/sclera: Conjunctivae normal.  ?Neck:  ?   Thyroid: No thyromegaly.  ?Cardiovascular:  ?   Rate and Rhythm: Normal rate and regular rhythm.  ?   Heart sounds: Normal heart sounds.  ?Pulmonary:  ?   Effort: Pulmonary effort is normal. No respiratory distress.  ?   Breath sounds: Normal breath sounds. No wheezing, rhonchi or rales.  ?Musculoskeletal:  ?   Cervical back: Neck supple.  ?   Right lower leg: No edema.  ?   Left lower leg: No edema.   ?Lymphadenopathy:  ?   Cervical: No cervical adenopathy.  ?Skin: ?   General: Skin is warm and dry.  ?Neurological:  ?   Mental Status: She is alert and oriented to person, place, and time. Mental status is at baseline.  ?Psychiatric:     ?   Mood and Affect: Mood normal.     ?  Behavior: Behavior normal.  ?  ? ? ?No results found for any visits on 03/09/22. ? Assessment & Plan  ?  ? ?Problem List Items Addressed This Visit   ? ?  ? Cardiovascular and Mediastinum  ? Essential (primary) hypertension - Primary  ?  Well controlled ?Continue current medications ?Recheck metabolic panel ?F/u in 6 months  ?  ?  ? Relevant Orders  ? Comprehensive metabolic panel  ?  ? Nervous and Auditory  ? Eustachian tube dysfunction, right  ?  Will resume flonase ? ?  ?  ?  ? Other  ? MDD (major depressive disorder)  ?  Chronic and well controlled ?Continue zoloft at current dose ? ?  ?  ? Relevant Medications  ? ALPRAZolam (XANAX) 0.5 MG tablet  ? Mixed anxiety and depressive disorder  ?  Chronic and well controlled  ?Continue zoloft at current dose  ?Continue xanax low dose - have discussed risks ? ?  ?  ? Relevant Medications  ? ALPRAZolam (XANAX) 0.5 MG tablet  ? Hypercholesteremia  ?  Previously well controlled ?Continue statin ?Repeat FLP and CMP ?  ?  ? Relevant Orders  ? Lipid panel  ? Comprehensive metabolic panel  ? Overweight  ?  Discussed importance of healthy weight management ?Discussed diet and exercise  ?  ?  ? ?Other Visit Diagnoses   ? ? Anxiety, generalized      ? Relevant Medications  ? ALPRAZolam (XANAX) 0.5 MG tablet  ? Situational anxiety      ? Relevant Medications  ? ALPRAZolam (XANAX) 0.5 MG tablet  ? ?  ?  ? ?Return in about 6 months (around 09/08/2022) for CPE.  ?   ? ?I, [name], MD, have reviewed all documentation for this visit. The documentation on 03/09/22 for the exam, diagnosis, procedures, and orders are all accurate and complete. ? ? ?Virginia Crews, MD, MPH ?[place] ?[facility]   ?

## 2022-03-09 NOTE — Assessment & Plan Note (Signed)
Chronic and well controlled Continue zoloft at current dose 

## 2022-03-09 NOTE — Assessment & Plan Note (Signed)
Previously well controlled Continue statin Repeat FLP and CMP  

## 2022-03-09 NOTE — Assessment & Plan Note (Signed)
Discussed importance of healthy weight management Discussed diet and exercise  

## 2022-03-09 NOTE — Assessment & Plan Note (Signed)
Will resume flonase ?

## 2022-03-09 NOTE — Assessment & Plan Note (Signed)
Chronic and well controlled  ?Continue zoloft at current dose  ?Continue xanax low dose - have discussed risks ?

## 2022-03-10 LAB — COMPREHENSIVE METABOLIC PANEL
ALT: 9 IU/L (ref 0–32)
AST: 11 IU/L (ref 0–40)
Albumin/Globulin Ratio: 2.1 (ref 1.2–2.2)
Albumin: 4.5 g/dL (ref 3.7–4.7)
Alkaline Phosphatase: 94 IU/L (ref 44–121)
BUN/Creatinine Ratio: 10 — ABNORMAL LOW (ref 12–28)
BUN: 10 mg/dL (ref 8–27)
Bilirubin Total: 0.6 mg/dL (ref 0.0–1.2)
CO2: 22 mmol/L (ref 20–29)
Calcium: 9.3 mg/dL (ref 8.7–10.3)
Chloride: 105 mmol/L (ref 96–106)
Creatinine, Ser: 0.98 mg/dL (ref 0.57–1.00)
Globulin, Total: 2.1 g/dL (ref 1.5–4.5)
Glucose: 103 mg/dL — ABNORMAL HIGH (ref 70–99)
Potassium: 4 mmol/L (ref 3.5–5.2)
Sodium: 142 mmol/L (ref 134–144)
Total Protein: 6.6 g/dL (ref 6.0–8.5)
eGFR: 58 mL/min/{1.73_m2} — ABNORMAL LOW (ref >59)

## 2022-03-10 LAB — LIPID PANEL
Chol/HDL Ratio: 3.4 ratio (ref 0.0–4.4)
Cholesterol, Total: 143 mg/dL (ref 100–199)
HDL: 42 mg/dL (ref >39)
LDL Chol Calc (NIH): 73 mg/dL (ref 0–99)
Triglycerides: 162 mg/dL — ABNORMAL HIGH (ref 0–149)
VLDL Cholesterol Cal: 28 mg/dL (ref 5–40)

## 2022-03-13 ENCOUNTER — Ambulatory Visit: Payer: PPO | Admitting: Family Medicine

## 2022-03-16 ENCOUNTER — Ambulatory Visit: Payer: PPO | Admitting: Family Medicine

## 2022-03-23 ENCOUNTER — Telehealth: Payer: Self-pay | Admitting: Family Medicine

## 2022-03-23 DIAGNOSIS — F418 Other specified anxiety disorders: Secondary | ICD-10-CM

## 2022-03-23 DIAGNOSIS — E7800 Pure hypercholesterolemia, unspecified: Secondary | ICD-10-CM

## 2022-03-23 NOTE — Telephone Encounter (Signed)
Medication Refill - Medication: ALPRAZolam (XANAX) 0.5 MG tablet  ? ?atorvastatin (LIPITOR) 10 MG tablet  ?[place] Dr. Brita Romp can call in prescriptions for the medications above/ pt stated it was urgent and only has a couple Tabs left / please advise asap  ? ?Has the patient contacted their pharmacy? No. ?(Agent: If no, request that the patient contact the pharmacy for the refill. If patient does not wish to contact the pharmacy document the reason why and proceed with request.) ?(Agent: If yes, when and what did the pharmacy advise?) ? ?Preferred Pharmacy (with phone number or street name):  ?[id number] - [place], [address] AT [address] Phone:  [phone]  ?Fax:  [phone]  ?  ? ?Has the patient been seen for an appointment in the last year OR does the patient have an upcoming appointment? Yes.   ? ?Agent: Please be advised that RX refills may take up to 3 business days. We ask that you follow-up with your pharmacy. ?

## 2022-03-24 MED ORDER — ALPRAZOLAM 0.5 MG PO TABS: ORAL_TABLET | ORAL | 0 refills | Status: DC

## 2022-03-24 MED ORDER — ATORVASTATIN CALCIUM 10 MG PO TABS: 10.0000 mg | ORAL_TABLET | Freq: Every day | ORAL | 1 refills | Status: DC

## 2022-03-24 NOTE — Addendum Note (Signed)
Addended by: Virginia Crews on: 03/24/2022 03:20 PM ? ? Modules accepted: Orders ? ?

## 2022-03-24 NOTE — Telephone Encounter (Addendum)
Pt is calling back to follow up on a medication refill for ALPRAZolam (XANAX) 0.5 MG tablet  [place] Tuesday. Asked if Dr. Brita Romp could call in a courtesy prescription for the medication above to get her through until Tuesday, pt stated it was urgent and only has a couple tabs left. ? ? ? ? ?Please advise asap  ? ?[id number] - [place], [place] - [address]  ?[address] [place] [address]  ?Phone: [phone] Fax: [phone]  ?Hours: Not open 24 hours  ? ?

## 2022-03-24 NOTE — Telephone Encounter (Signed)
Requested medication (s) are due for refill today: no ? ?Requested medication (s) are on the active medication list: yes ? ?Last refill:  Xanax 03/09/22 #60/5, Lipitor 09/17/21 #90/1 ? ?Future visit scheduled: yes ? ?Notes to clinic:  [place] and [place]ill so she doesn't be without medication. Please advise ? ? ?  ?Requested Prescriptions  ?Pending Prescriptions Disp Refills  ? atorvastatin (LIPITOR) 10 MG tablet 90 tablet 1  ?  Sig: Take 1 tablet (10 mg total) by mouth daily.  ?  ? Cardiovascular:  Antilipid - Statins Failed - 03/23/2022  5:13 PM  ?  ?  Failed - Lipid Panel in normal range within the last 12 months  ?  Cholesterol, Total  ?Date Value Ref Range Status  ?03/09/2022 143 100 - 199 mg/dL Final  ? ?LDL Chol Calc (NIH)  ?Date Value Ref Range Status  ?03/09/2022 73 0 - 99 mg/dL Final  ? ?HDL  ?Date Value Ref Range Status  ?03/09/2022 42 >39 mg/dL Final  ? ?Triglycerides  ?Date Value Ref Range Status  ?03/09/2022 162 (H) 0 - 149 mg/dL Final  ? ?  ?  ?  Passed - Patient is not pregnant  ?  ?  Passed - Valid encounter within last 12 months  ?  Recent Outpatient Visits   ? ?      ? 2 weeks ago Essential (primary) hypertension  ? [facility] [place], Dionne Bucy, MD  ? 6 months ago Essential (primary) hypertension  ? [facility], Dionne Bucy, MD  ? 8 months ago Encounter for annual physical exam  ? [facility], Dionne Bucy, MD  ? 1 year ago Essential (primary) hypertension  ? [facility], Dionne Bucy, MD  ? 1 year ago Essential (primary) hypertension  ? [facility], Dionne Bucy, MD  ? ?  ?  ?Future Appointments   ? ?        ? In 5 months Bacigalupo, Dionne Bucy, MD [facility]  ? ?  ? ? ?  ?  ?  ? ALPRAZolam (XANAX) 0.5 MG tablet 60 tablet 5  ?  Sig: TAKE 1 TABLET BY MOUTH EVERY 12 HOURS AS NEEDED -  MUST  LAST  30  DAYS  ?  ? Not Delegated - Psychiatry:  Anxiolytics/Hypnotics 2 Failed - 03/23/2022  5:13 PM  ?  ?  Failed - This refill cannot be delegated  ?  ?  Failed - Urine Drug Screen completed in last 360 days  ?  ?  Passed - Patient is not pregnant  ?  ?  Passed - Valid encounter within last 6 months  ?  Recent Outpatient Visits   ? ?      ? 2 weeks ago Essential (primary) hypertension  ? [facility] [place], Dionne Bucy, MD  ? 6 months ago Essential (primary) hypertension  ? [facility], Dionne Bucy, MD  ? 8 months ago Encounter for annual physical exam  ? [facility], Dionne Bucy, MD  ? 1 year ago Essential (primary) hypertension  ? [facility], Dionne Bucy, MD  ? 1 year ago Essential (primary) hypertension  ? [facility], Dionne Bucy, MD  ? ?  ?  ?Future Appointments   ? ?        ? In 5 months Bacigalupo, Dionne Bucy, MD [facility]  ? ?  ? ? ?  ?  ?  ? ?

## 2022-03-24 NOTE — Telephone Encounter (Signed)
Patient advised.

## 2022-05-08 IMAGING — CT CT SHOULDER*R* W/O CM
2 series · 10 of 14 positions shown, 12 images · non-contrast
Comparison: None.

CLINICAL DATA: Proximal right humerus fracture secondary to a fall.
Pain, bruising, and swelling.

EXAM:
CT OF THE UPPER RIGHT EXTREMITY WITHOUT CONTRAST
TECHNIQUE: Multidetector CT imaging of the upper right extremity was performed
according to the standard protocol.

[Series 5: ax bone · axial · 0.39mm/px · z∈[-497,-376]mm · 5 of 104 slices shown, 7 images]
[im 18/104  soft-tissue]
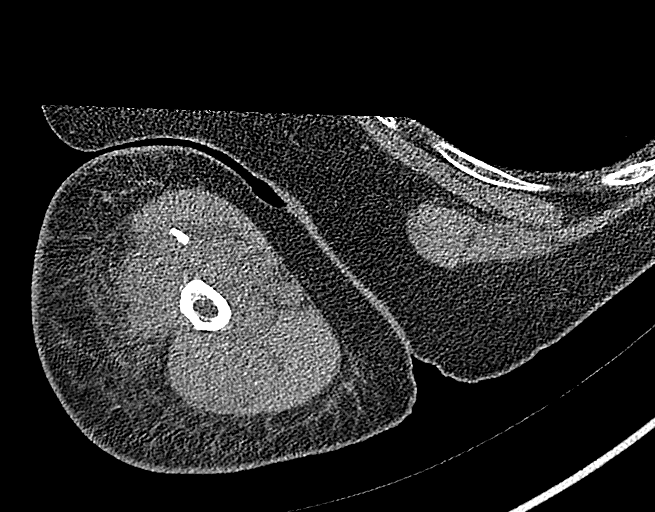
[im 18/104  bone]
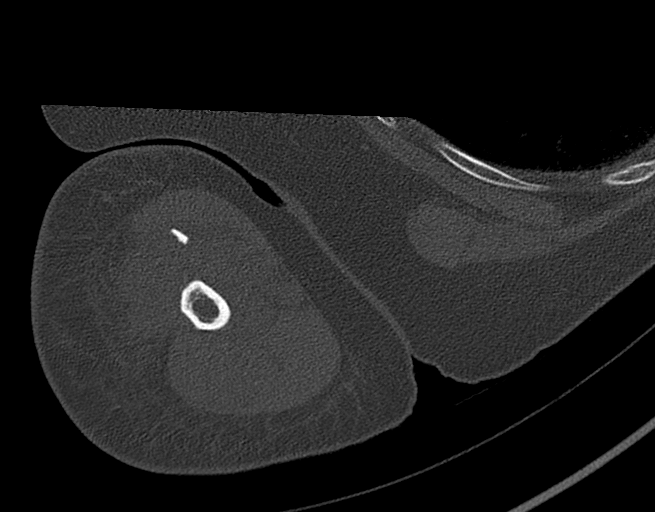
[im 35/104  bone]
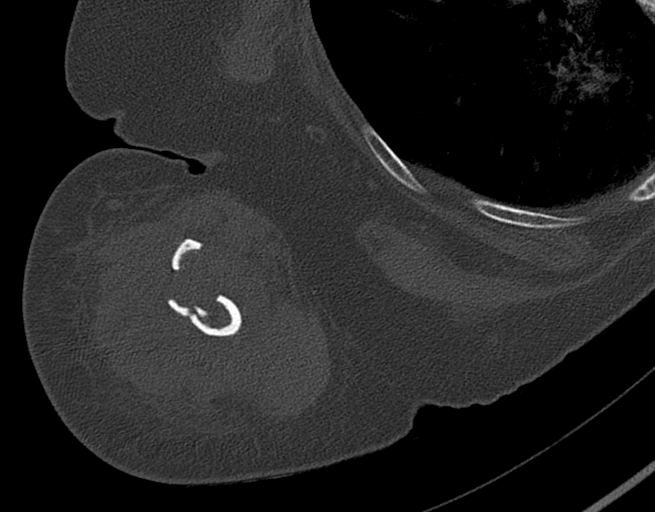
[im 52/104  bone]
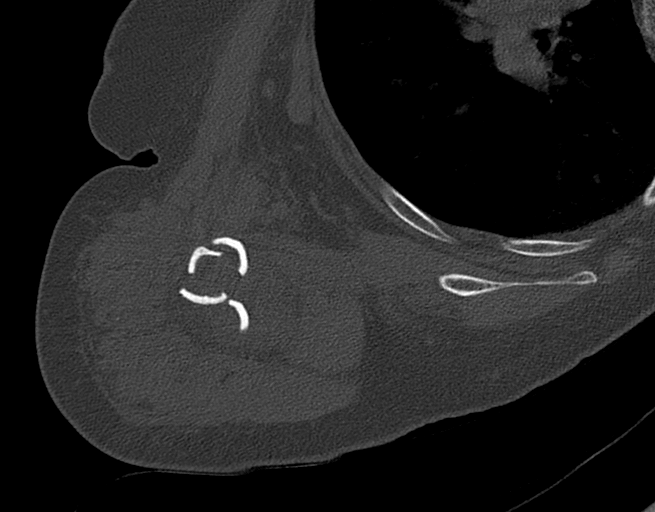
[im 69/104  bone]
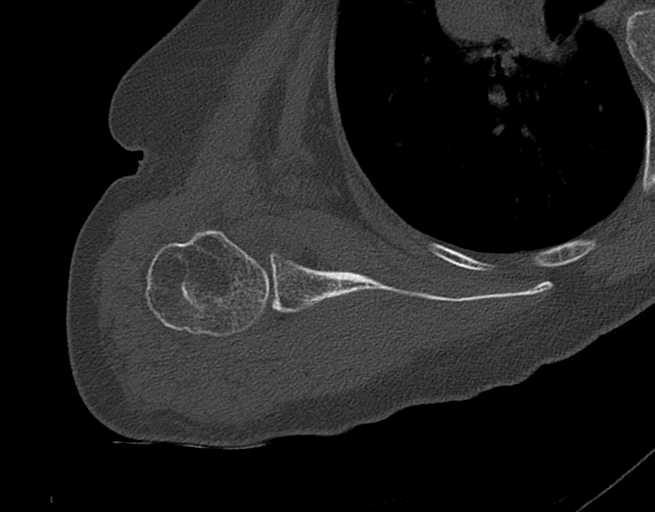
[im 86/104  soft-tissue]
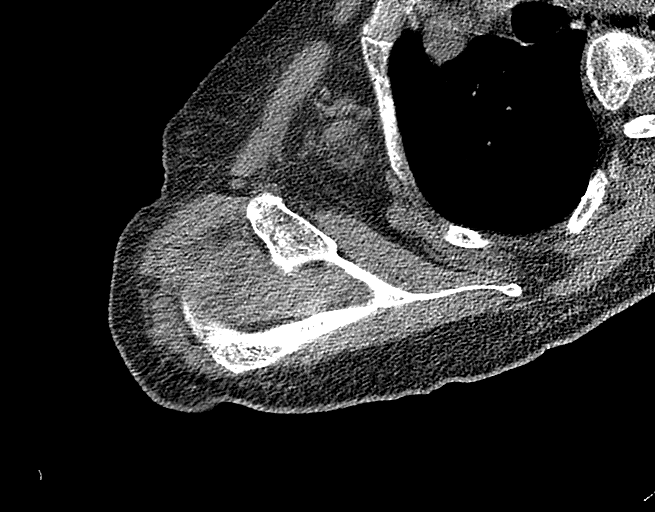
[im 86/104  bone]
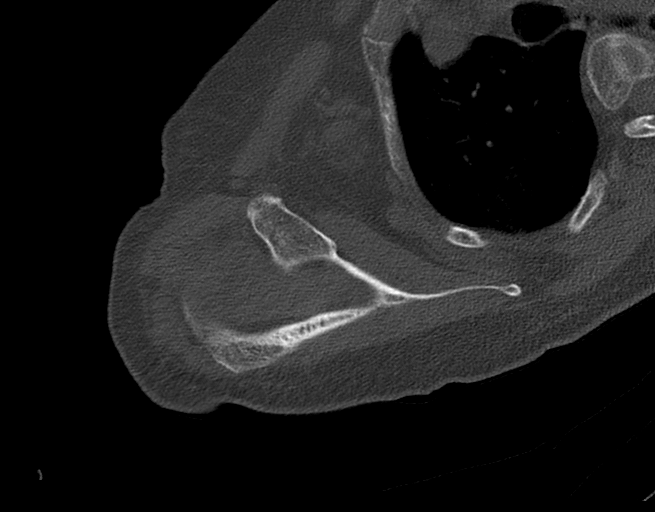

[Series 6: ax st · axial · 0.39mm/px · z∈[-497,-376]mm · 5 of 104 slices shown]
[im 18/104  bone]
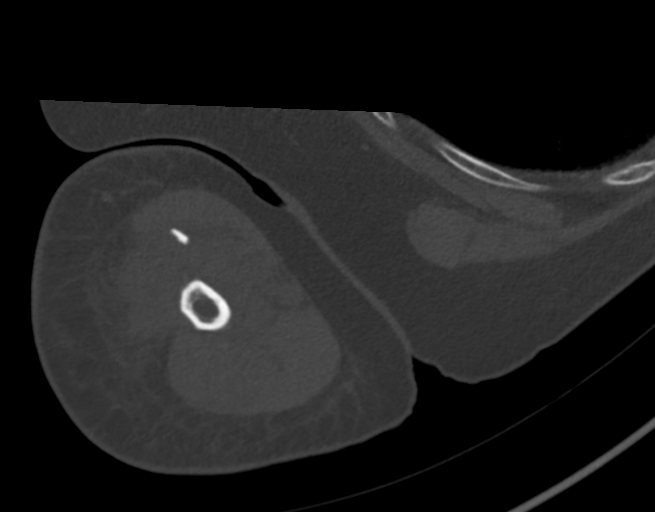
[im 35/104  bone]
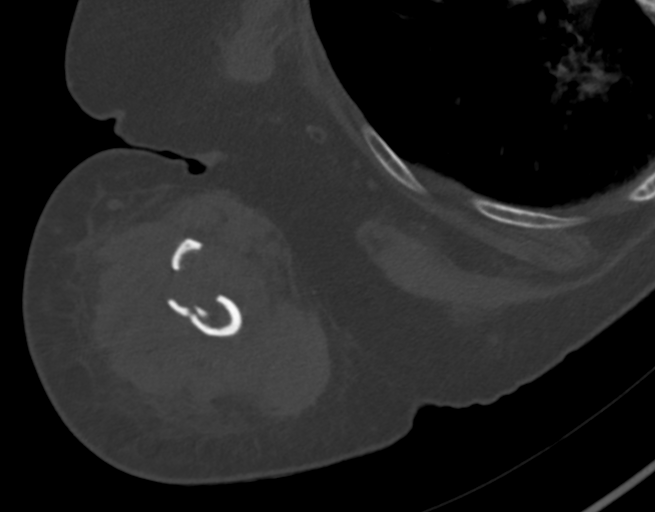
[im 52/104  bone]
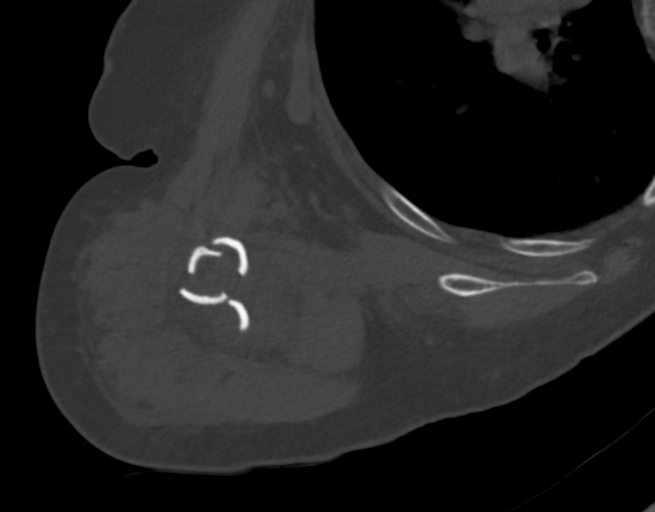
[im 69/104  bone]
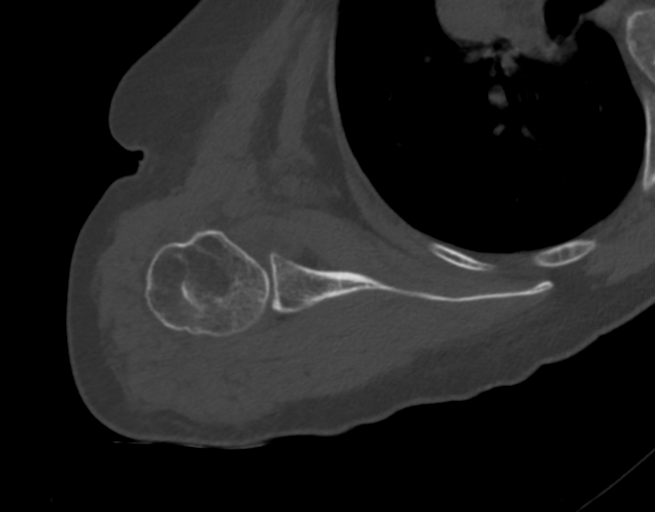
[im 86/104  bone]
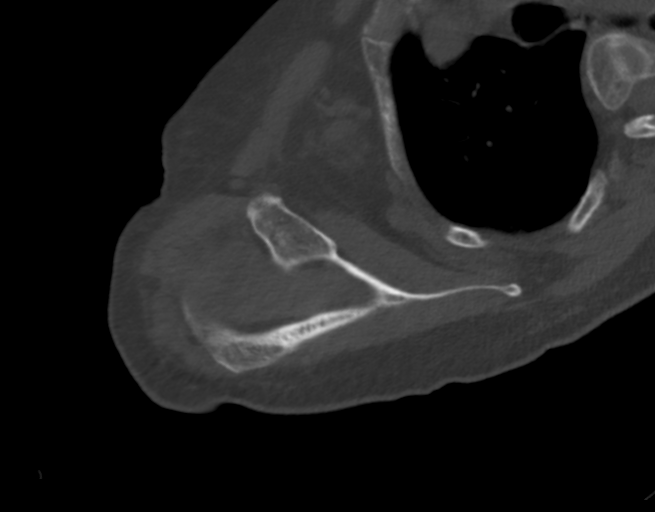

[10 of 14 positions shown; findings below may reference images not displayed]

FINDINGS: Bones/Joint/Cartilage

There is a comminuted displaced angulated spiral fracture of the
proximal humeral shaft with some impaction of the proximal elements.
Displaced bone extends into the muscles of the anterior aspect of
the upper arm. No dislocation. The articular surface of the humeral
head is intact. Scapula is intact. Chronic calcification which
appears to be in the posterosuperior labrum.

Small glenohumeral joint effusion or hemarthrosis.

Muscles and Tendons and soft tissues

There is a large displaced fragment of the humeral shaft which
extends into the anterior musculature of the upper arm. There is a
hematoma in the soft tissues at the fracture site.
IMPRESSION: 1. Comminuted displaced angulated spiral fracture of the proximal
humeral shaft with some impaction of the proximal elements.
2. Small glenohumeral joint effusion or hemarthrosis.

## 2022-05-09 ENCOUNTER — Other Ambulatory Visit: Payer: Self-pay | Admitting: Family Medicine

## 2022-06-05 ENCOUNTER — Other Ambulatory Visit: Payer: Self-pay | Admitting: Family Medicine

## 2022-06-22 ENCOUNTER — Other Ambulatory Visit: Payer: Self-pay | Admitting: Family Medicine

## 2022-06-22 DIAGNOSIS — E7800 Pure hypercholesterolemia, unspecified: Secondary | ICD-10-CM

## 2022-06-22 DIAGNOSIS — F418 Other specified anxiety disorders: Secondary | ICD-10-CM

## 2022-06-23 NOTE — Telephone Encounter (Signed)
Requested medication (s) are due for refill today - yes  Requested medication (s) are on the active medication list -yes  Future visit scheduled -yes  Last refill: 03/24/22 #60  Notes to clinic: non delegated Rx  Requested Prescriptions  Pending Prescriptions Disp Refills   ALPRAZolam (XANAX) 0.5 MG tablet [Pharmacy Med Name: ALPRAZolam 0.5 MG Oral Tablet] 60 tablet 0    Sig: TAKE 1 TABLET BY MOUTH EVERY 12 HOURS. MUST LAST 30 DAYS     Not Delegated - Psychiatry: Anxiolytics/Hypnotics 2 Failed - 06/22/2022  1:40 PM      Failed - This refill cannot be delegated      Failed - Urine Drug Screen completed in last 360 days      Passed - Patient is not pregnant      Passed - Valid encounter within last 6 months    Recent Outpatient Visits           3 months ago Essential (primary) hypertension   [facility], Dionne Bucy, MD   9 months ago Essential (primary) hypertension   [facility], Dionne Bucy, MD   11 months ago Encounter for annual physical exam   [facility], Dionne Bucy, MD   1 year ago Essential (primary) hypertension   [facility], Dionne Bucy, MD   1 year ago Essential (primary) hypertension   [facility], Dionne Bucy, MD       Future Appointments             In 2 months Bacigalupo, Dionne Bucy, MD [facility]            Refused Prescriptions Disp Refills   atorvastatin (LIPITOR) 10 MG tablet [Pharmacy Med Name: Atorvastatin Calcium 10 MG Oral Tablet] 90 tablet 0    Sig: Take 1 tablet by mouth once daily     Cardiovascular:  Antilipid - Statins Failed - 06/22/2022  1:40 PM      Failed - Lipid Panel in normal range within the last 12 months    Cholesterol, Total  Date Value Ref Range Status  03/09/2022 143 100 - 199 mg/dL Final   LDL Chol Calc (NIH)  Date Value Ref Range Status  03/09/2022 73 0 - 99 mg/dL Final   HDL   Date Value Ref Range Status  03/09/2022 42 >39 mg/dL Final   Triglycerides  Date Value Ref Range Status  03/09/2022 162 (H) 0 - 149 mg/dL Final         Passed - Patient is not pregnant      Passed - Valid encounter within last 12 months    Recent Outpatient Visits           3 months ago Essential (primary) hypertension   [facility], Dionne Bucy, MD   9 months ago Essential (primary) hypertension   [facility], Dionne Bucy, MD   11 months ago Encounter for annual physical exam   [facility], Dionne Bucy, MD   1 year ago Essential (primary) hypertension   [facility], Dionne Bucy, MD   1 year ago Essential (primary) hypertension   [place], Dionne Bucy, MD       Future Appointments             In 2 months Bacigalupo, Dionne Bucy, MD [facility]  Requested Prescriptions  Pending Prescriptions Disp Refills   ALPRAZolam (XANAX) 0.5 MG tablet [Pharmacy Med Name: ALPRAZolam 0.5 MG Oral Tablet] 60 tablet 0    Sig: TAKE 1 TABLET BY MOUTH EVERY 12 HOURS. MUST LAST 30 DAYS     Not Delegated - Psychiatry: Anxiolytics/Hypnotics 2 Failed - 06/22/2022  1:40 PM      Failed - This refill cannot be delegated      Failed - Urine Drug Screen completed in last 360 days      Passed - Patient is not pregnant      Passed - Valid encounter within last 6 months    Recent Outpatient Visits           3 months ago Essential (primary) hypertension   [facility], Dionne Bucy, MD   9 months ago Essential (primary) hypertension   [facility], Dionne Bucy, MD   11 months ago Encounter for annual physical exam   [facility], Dionne Bucy, MD   1 year ago Essential (primary) hypertension   [facility], Dionne Bucy, MD   1 year ago Essential (primary)  hypertension   [facility], Dionne Bucy, MD       Future Appointments             In 2 months Bacigalupo, Dionne Bucy, MD [facility]            Refused Prescriptions Disp Refills   atorvastatin (LIPITOR) 10 MG tablet [Pharmacy Med Name: Atorvastatin Calcium 10 MG Oral Tablet] 90 tablet 0    Sig: Take 1 tablet by mouth once daily     Cardiovascular:  Antilipid - Statins Failed - 06/22/2022  1:40 PM      Failed - Lipid Panel in normal range within the last 12 months    Cholesterol, Total  Date Value Ref Range Status  03/09/2022 143 100 - 199 mg/dL Final   LDL Chol Calc (NIH)  Date Value Ref Range Status  03/09/2022 73 0 - 99 mg/dL Final   HDL  Date Value Ref Range Status  03/09/2022 42 >39 mg/dL Final   Triglycerides  Date Value Ref Range Status  03/09/2022 162 (H) 0 - 149 mg/dL Final         Passed - Patient is not pregnant      Passed - Valid encounter within last 12 months    Recent Outpatient Visits           3 months ago Essential (primary) hypertension   [facility], Dionne Bucy, MD   9 months ago Essential (primary) hypertension   [facility], Dionne Bucy, MD   11 months ago Encounter for annual physical exam   [facility], Dionne Bucy, MD   1 year ago Essential (primary) hypertension   [facility], Dionne Bucy, MD   1 year ago Essential (primary) hypertension   [place], Dionne Bucy, MD       Future Appointments             In 2 months Bacigalupo, Dionne Bucy, MD [facility]

## 2022-06-23 NOTE — Telephone Encounter (Signed)
Rx 03/24/22 #90 1RF- too soon Requested Prescriptions  Pending Prescriptions Disp Refills  . ALPRAZolam (XANAX) 0.5 MG tablet [Pharmacy Med Name: ALPRAZolam 0.5 MG Oral Tablet] 60 tablet 0    Sig: TAKE 1 TABLET BY MOUTH EVERY 12 HOURS. MUST LAST 30 DAYS     Not Delegated - Psychiatry: Anxiolytics/Hypnotics 2 Failed - 06/22/2022  1:40 PM      Failed - This refill cannot be delegated      Failed - Urine Drug Screen completed in last 360 days      Passed - Patient is not pregnant      Passed - Valid encounter within last 6 months    Recent Outpatient Visits          3 months ago Essential (primary) hypertension   [facility], Dionne Bucy, MD   9 months ago Essential (primary) hypertension   [facility], Dionne Bucy, MD   11 months ago Encounter for annual physical exam   [facility], Dionne Bucy, MD   1 year ago Essential (primary) hypertension   [facility], Dionne Bucy, MD   1 year ago Essential (primary) hypertension   [place] [facility], Dionne Bucy, MD      Future Appointments            In 2 months Bacigalupo, Dionne Bucy, MD [facility]           Refused Prescriptions Disp Refills  . atorvastatin (LIPITOR) 10 MG tablet [Pharmacy Med Name: Atorvastatin Calcium 10 MG Oral Tablet] 90 tablet 0    Sig: Take 1 tablet by mouth once daily     Cardiovascular:  Antilipid - Statins Failed - 06/22/2022  1:40 PM      Failed - Lipid Panel in normal range within the last 12 months    Cholesterol, Total  Date Value Ref Range Status  03/09/2022 143 100 - 199 mg/dL Final   LDL Chol Calc (NIH)  Date Value Ref Range Status  03/09/2022 73 0 - 99 mg/dL Final   HDL  Date Value Ref Range Status  03/09/2022 42 >39 mg/dL Final   Triglycerides  Date Value Ref Range Status  03/09/2022 162 (H) 0 - 149 mg/dL Final         Passed - Patient is not pregnant       Passed - Valid encounter within last 12 months    Recent Outpatient Visits          3 months ago Essential (primary) hypertension   [facility], Dionne Bucy, MD   9 months ago Essential (primary) hypertension   [facility], Dionne Bucy, MD   11 months ago Encounter for annual physical exam   [facility], Dionne Bucy, MD   1 year ago Essential (primary) hypertension   [facility], Dionne Bucy, MD   1 year ago Essential (primary) hypertension   [place] [facility], Dionne Bucy, MD      Future Appointments            In 2 months Bacigalupo, Dionne Bucy, MD [facility] [place]

## 2022-06-25 ENCOUNTER — Encounter: Payer: PPO | Admitting: Family Medicine

## 2022-07-23 ENCOUNTER — Other Ambulatory Visit: Payer: Self-pay | Admitting: Family Medicine

## 2022-07-23 DIAGNOSIS — F418 Other specified anxiety disorders: Secondary | ICD-10-CM

## 2022-07-24 ENCOUNTER — Other Ambulatory Visit: Payer: Self-pay | Admitting: Family Medicine

## 2022-07-24 DIAGNOSIS — Z1231 Encounter for screening mammogram for malignant neoplasm of breast: Secondary | ICD-10-CM

## 2022-08-13 ENCOUNTER — Other Ambulatory Visit: Payer: Self-pay | Admitting: Family Medicine

## 2022-08-21 ENCOUNTER — Ambulatory Visit
Admission: RE | Admit: 2022-08-21 | Discharge: 2022-08-21 | Disposition: A | Discharge status: Home / Self Care | Payer: PPO | Source: Ambulatory Visit | Attending: Family Medicine | Admitting: Family Medicine

## 2022-08-21 DIAGNOSIS — Z1231 Encounter for screening mammogram for malignant neoplasm of breast: Secondary | ICD-10-CM | POA: Insufficient documentation

## 2022-08-25 ENCOUNTER — Ambulatory Visit (INDEPENDENT_AMBULATORY_CARE_PROVIDER_SITE_OTHER): Payer: PPO | Admitting: Family Medicine

## 2022-08-25 DIAGNOSIS — Z23 Encounter for immunization: Secondary | ICD-10-CM | POA: Diagnosis not present

## 2022-08-25 NOTE — Progress Notes (Signed)
Patient presented for vaccination.  Vaccine administered by CMA  No MD required for this encounter.   Eulis Foster, MD  [facility]  [phone]

## 2022-08-28 ENCOUNTER — Other Ambulatory Visit: Payer: Self-pay | Admitting: Family Medicine

## 2022-08-28 DIAGNOSIS — F418 Other specified anxiety disorders: Secondary | ICD-10-CM

## 2022-08-28 NOTE — Telephone Encounter (Signed)
Requested medication (s) are due for refill today: yes  Requested medication (s) are on the active medication list: yes  Last refill:  07/29/22  Future visit scheduled:yes  Notes to clinic:  Unable to refill per protocol, cannot delegate.      Requested Prescriptions  Pending Prescriptions Disp Refills   ALPRAZolam (XANAX) 0.5 MG tablet [Pharmacy Med Name: ALPRAZolam 0.5 MG Oral Tablet] 60 tablet 0    Sig: TAKE 1 TABLET BY MOUTH EVERY 12 HOURS -  MUST  LAST  30  DAYS     Not Delegated - Psychiatry: Anxiolytics/Hypnotics 2 Failed - 08/28/2022  9:15 AM      Failed - This refill cannot be delegated      Failed - Urine Drug Screen completed in last 360 days      Passed - Patient is not pregnant      Passed - Valid encounter within last 6 months    Recent Outpatient Visits           3 days ago Need for immunization against influenza   [facility] Simmons-Robinson, [place], MD   5 months ago Essential (primary) hypertension   [facility], Dionne Bucy, MD   12 months ago Essential (primary) hypertension   [facility], Dionne Bucy, MD   1 year ago Encounter for annual physical exam   [facility] Bacigalupo, Dionne Bucy, MD   1 year ago Essential (primary) hypertension   [facility] Bacigalupo, Dionne Bucy, MD       Future Appointments             In 2 weeks Bacigalupo, Dionne Bucy, MD [facility], [place]

## 2022-09-05 ENCOUNTER — Other Ambulatory Visit: Payer: Self-pay | Admitting: Family Medicine

## 2022-09-09 ENCOUNTER — Other Ambulatory Visit: Payer: Self-pay | Admitting: Family Medicine

## 2022-09-09 MED ORDER — OMEPRAZOLE 20 MG PO CPDR: 20.0000 mg | DELAYED_RELEASE_CAPSULE | Freq: Every day | ORAL | 1 refills | Status: DC

## 2022-09-17 ENCOUNTER — Encounter: Payer: PPO | Admitting: Family Medicine

## 2022-09-22 ENCOUNTER — Other Ambulatory Visit: Payer: Self-pay | Admitting: Family Medicine

## 2022-09-22 DIAGNOSIS — F418 Other specified anxiety disorders: Secondary | ICD-10-CM

## 2022-09-22 DIAGNOSIS — E7800 Pure hypercholesterolemia, unspecified: Secondary | ICD-10-CM

## 2022-09-23 NOTE — Telephone Encounter (Signed)
Requested medication (s) are due for refill today: in 5 days  Requested medication (s) are on the active medication list: yes  Last refill:  08/28/22 #60 with 0 RF  Future visit scheduled: 09/28/22, seen 08/25/22  Notes to clinic:  This medication can not be delegated, please assess.        Requested Prescriptions  Pending Prescriptions Disp Refills   ALPRAZolam (XANAX) 0.5 MG tablet [Pharmacy Med Name: ALPRAZolam 0.5 MG Oral Tablet] 60 tablet 0    Sig: TAKE 1 TABLET BY MOUTH EVERY 12 HOURS MUST  LAST  30  DAYS     Not Delegated - Psychiatry: Anxiolytics/Hypnotics 2 Failed - 09/22/2022 10:04 PM      Failed - This refill cannot be delegated      Failed - Urine Drug Screen completed in last 360 days      Failed - Valid encounter within last 6 months    Recent Outpatient Visits           4 weeks ago Need for immunization against influenza   [facility] Simmons-Robinson, Riki Sheer, MD   6 months ago Essential (primary) hypertension   [facility], Dionne Bucy, MD   1 year ago Essential (primary) hypertension   [facility], Dionne Bucy, MD   1 year ago Encounter for annual physical exam   [facility], Dionne Bucy, MD   1 year ago Essential (primary) hypertension   [facility], Dionne Bucy, MD       Future Appointments             In 5 days Bacigalupo, Dionne Bucy, MD [facility], [place] - Patient is not pregnant       atorvastatin (LIPITOR) 10 MG tablet [Pharmacy Med Name: Atorvastatin Calcium 10 MG Oral Tablet] 90 tablet 0    Sig: Take 1 tablet by mouth once daily     Cardiovascular:  Antilipid - Statins Failed - 09/22/2022 10:04 PM      Failed - Lipid Panel in normal range within the last 12 months    Cholesterol, Total  Date Value Ref Range Status  03/09/2022 143 100 - 199 mg/dL Final   LDL Chol Calc (NIH)  Date Value Ref Range  Status  03/09/2022 73 0 - 99 mg/dL Final   HDL  Date Value Ref Range Status  03/09/2022 42 >39 mg/dL Final   Triglycerides  Date Value Ref Range Status  03/09/2022 162 (H) 0 - 149 mg/dL Final         Passed - Patient is not pregnant      Passed - Valid encounter within last 12 months    Recent Outpatient Visits           4 weeks ago Need for immunization against influenza   [facility] Simmons-Robinson, [place], MD   6 months ago Essential (primary) hypertension   [facility], Dionne Bucy, MD   1 year ago Essential (primary) hypertension   [facility], Dionne Bucy, MD   1 year ago Encounter for annual physical exam   [facility] Bacigalupo, Dionne Bucy, MD   1 year ago Essential (primary) hypertension   [facility] Bacigalupo, Dionne Bucy, MD       Future Appointments             In 5 days Virginia Crews,  MD [facility], [place]

## 2022-09-25 ENCOUNTER — Other Ambulatory Visit: Payer: Self-pay | Admitting: Family Medicine

## 2022-09-25 DIAGNOSIS — F418 Other specified anxiety disorders: Secondary | ICD-10-CM

## 2022-09-25 NOTE — Progress Notes (Unsigned)
I,Rita Cruz,acting as a scribe for Rita Paganini, MD.,have documented all relevant documentation on the behalf of Rita Paganini, MD,as directed by  Rita Paganini, MD while in the presence of Rita Paganini, MD.   Complete physical exam   Patient: Rita Cruz   DOB: May 26, 1941   81 y.o. Female  MRN: [id number] Visit Date: 09/28/2022  Today's healthcare provider: [name], MD   Chief Complaint  Patient presents with   Annual Exam   Subjective    Rita Cruz is a 81 y.o. female who presents today for a complete physical exam.  She reports consuming a general diet. Home exercise routine includes stretching. She generally feels well. She reports sleeping well. She does have additional problems to discuss today. Wants help filling out a DRN form. HPI  01/06/22 AWV   Having a lot of stress as a caregiver for her husband with newly diagnosed vascular dementia  Past Medical History:  Diagnosis Date   Adenomatous polyps    Anxiety    Arthritis    neck   Bronchitis 02/04/2018   Capsulitis of toe of right foot    Chest pain    Adenosine Myoview in 2006 done for atypical chest pain, showed no eviden e for ischemia or infarct   Colon polyps    GERD (gastroesophageal reflux disease)    History of colonic polyps    HTN (hypertension)    Hyperlipidemia    Postoperative nausea and vomiting 01/06/2022   PUD (peptic ulcer disease)    with hx of H. Pylori   Past Surgical History:  Procedure Laterality Date   ABDOMINAL HYSTERECTOMY     BREAST BIOPSY Right 2011   benign mass done in Dr. [place] office   BREAST BIOPSY Right 06/29/2015   top hat marker. PROLIFERATIVE FIBROCYSTIC CHANGE WITH ASSOCIATED    CERVICAL DISCECTOMY     CHOLECYSTECTOMY     COLONOSCOPY WITH PROPOFOL N/A 10/06/2016   Procedure: COLONOSCOPY WITH PROPOFOL;  Surgeon: Lollie Sails, MD;  Location[facility] ENDOSCOPY;  Service: Endoscopy;  Laterality: N/A;    COLONOSCOPY WITH PROPOFOL N/A 12/30/2018   Procedure: COLONOSCOPY WITH PROPOFOL;  Surgeon: Lollie Sails, MD;  Location: [facility] ENDOSCOPY;  Service: Endoscopy;  Laterality: N/A;   COLONOSCOPY WITH PROPOFOL N/A 01/30/2020   Procedure: COLONOSCOPY WITH PROPOFOL;  Surgeon: Lucilla Lame, MD;  Location: [facility] ENDOSCOPY;  Service: Endoscopy;  Laterality: N/A;   EYE SURGERY     FRACTURE SURGERY Right 05/21/2020   fell while at the beach. Proximal humerus fracture   hysterectomy-unspecified area     LAPAROSCOPIC RIGHT COLECTOMY Right 01/23/2019   Procedure: LAPAROSCOPIC RIGHT COLECTOMY;  Surgeon: Robert Bellow, MD;  Location: [facility] ORS;  Service: General;  Laterality: Right;   Social History   Socioeconomic History   Marital status: Married    Spouse name: Not on file   Number of children: 2   Years of education: Not on file   Highest education level: Some college, no degree  Occupational History   Occupation: retired  Tobacco Use   Smoking status: Never   Smokeless tobacco: Never   Tobacco comments:    does not smoke  Vaping Use   Vaping Use: Never used  Substance and Sexual Activity   Alcohol use: Yes    Alcohol/week: 7.0 standard drinks of alcohol    Types: 7 Glasses of wine per week    Comment: 1 glass of wine nightly   Drug use: No  Sexual activity: Not Currently    Birth control/protection: Surgical    Comment: Hysterectomy  Other Topics Concern   Not on file  Social History Narrative   Married-2nd marriage. She has 2 children and her husband has 2 children.    Social Determinants of Health   Financial Resource Strain: Low Risk  (01/06/2022)   Overall Financial Resource Strain (CARDIA)    Difficulty of Paying Living Expenses: Not hard at all  Food Insecurity: No Food Insecurity (01/06/2022)   Hunger Vital Sign    Worried About Running Out of Food in the Last Year: Never true    Ran Out of Food in the Last Year: Never true  Transportation Needs: No Transportation Needs  (01/06/2022)   PRAPARE - Hydrologist (Medical): No    Lack of Transportation (Non-Medical): No  Physical Activity: Sufficiently Active (01/06/2022)   Exercise Vital Sign    Days of Exercise per Week: 7 days    Minutes of Exercise per Session: 30 min  Stress: No Stress Concern Present (01/06/2022)   [place]    Feeling of Stress : Only a little  Social Connections: Moderately Integrated (01/06/2022)   Social Connection and Isolation Panel [NHANES]    Frequency of Communication with Friends and Family: More than three times a week    Frequency of Social Gatherings with Friends and Family: More than three times a week    Attends Religious Services: Never    Marine scientist or Organizations: Yes    Attends Music therapist: More than 4 times per year    Marital Status: Married  Human resources officer Violence: Not At Risk (01/06/2022)   Humiliation, Afraid, Rape, and Kick questionnaire    Fear of Current or Ex-Partner: No    Emotionally Abused: No    Physically Abused: No    Sexually Abused: No   Family Status  Relation Name Status   Father  Deceased at age 36       CHF, diabetes   Mother  Deceased at age 78   Brother  Alive   Daughter  Alive   Son  Rita Cruz   Other niece Alive   Neg Hx  (Not Specified)   Family History  Problem Relation Age of Onset   Kidney failure Father    Heart failure Father    Diabetes Father    COPD Mother    Colon cancer Mother 5   Heart attack Mother    Ulcers Mother    Osteoporosis Mother    Macular degeneration Mother    Hypertension Brother    Irritable bowel syndrome Daughter    Colon polyps Daughter    Kidney Stones Son    Bipolar disorder Son    Melanoma Son        stage IV   Hypertension Son    Kidney cancer Other    Breast cancer Neg Hx    Allergies  Allergen Reactions   Morphine And Related Other (See  Comments)    Hallucinations    Percocet  [Oxycodone-Acetaminophen] Other (See Comments)    Hallucinations Hallucinations   Levaquin [Levofloxacin In D5w] Diarrhea    Vaginal itching   Lisinopril     cough   Meloxicam     Upset stomach   Penicillins Itching    Patient Care Team: Virginia Crews, MD as PCP - General (Family Medicine) Porfilio,  Gwyndolyn Saxon, MD as Referring Physician (Ophthalmology) Sharlet Salina, MD as Referring Physician (Physical Medicine and Rehabilitation) Margaretha Sheffield, MD (Otolaryngology) Lucilla Lame, MD as Consulting Physician (Gastroenterology) Bary Castilla Forest Gleason, MD as Consulting Physician (General Surgery)   Medications: Outpatient Medications Prior to Visit  Medication Sig   acetaminophen (TYLENOL) 325 MG tablet Take 650 mg by mouth every 6 (six) hours as needed for moderate pain.   ALPRAZolam (XANAX) 0.5 MG tablet TAKE 1 TABLET BY MOUTH EVERY 12 HOURS MUST  LAST  30  DAYS   aspirin 81 MG EC tablet Take 81 mg by mouth at bedtime.    atorvastatin (LIPITOR) 10 MG tablet Take 1 tablet by mouth once daily   fluticasone (FLONASE) 50 MCG/ACT nasal spray Place 2 sprays into both nostrils daily as needed for allergies.   GENTEAL TEARS 0.1-0.3 % SOLN as needed.    losartan (COZAAR) 100 MG tablet Take 1 tablet by mouth once daily   omeprazole (PRILOSEC) 20 MG capsule Take 1 capsule (20 mg total) by mouth daily.   sertraline (ZOLOFT) 100 MG tablet TAKE 1 & 1/2 (ONE & ONE-HALF) TABLETS BY MOUTH ONCE DAILY   co-enzyme Q-10 30 MG capsule Take by mouth daily. Unsure dose (Patient not taking: Reported on 09/28/2022)   No facility-administered medications prior to visit.    Review of Systems    Objective    BP 118/75 (BP Location: Left Arm, Patient Position: Sitting, Cuff Size: Large)   Pulse 76   Resp 16   Ht '5\' 9"'$  (1.753 m)   Wt 181 lb (82.1 kg)   SpO2 98%   BMI 26.73 kg/m     Physical Exam Vitals reviewed.  Constitutional:      General: She is  not in acute distress.    Appearance: Normal appearance. She is well-developed. She is not diaphoretic.  HENT:     Head: Normocephalic and atraumatic.     Right Ear: Tympanic membrane, ear canal and external ear normal.     Left Ear: Tympanic membrane, ear canal and external ear normal.     Nose: Nose normal.     Mouth/Throat:     Mouth: Mucous membranes are moist.     Pharynx: Oropharynx is clear. No oropharyngeal exudate.  Eyes:     General: No scleral icterus.    Conjunctiva/sclera: Conjunctivae normal.     Pupils: Pupils are equal, round, and reactive to light.  Neck:     Thyroid: No thyromegaly.  Cardiovascular:     Rate and Rhythm: Normal rate and regular rhythm.     Pulses: Normal pulses.     Heart sounds: Normal heart sounds. No murmur heard. Pulmonary:     Effort: Pulmonary effort is normal. No respiratory distress.     Breath sounds: Normal breath sounds. No wheezing or rales.  Abdominal:     General: There is no distension.     Palpations: Abdomen is soft.     Tenderness: There is no abdominal tenderness.  Musculoskeletal:        General: No deformity.     Cervical back: Neck supple.     Right lower leg: No edema.     Left lower leg: No edema.  Lymphadenopathy:     Cervical: No cervical adenopathy.  Skin:    General: Skin is warm and dry.     Findings: No rash.  Neurological:     Mental Status: She is alert and oriented to person, place, and time. Mental status is at  baseline.     Sensory: No sensory deficit.     Motor: No weakness.     Gait: Gait normal.  Psychiatric:        Mood and Affect: Mood normal.        Behavior: Behavior normal.        Thought Content: Thought content normal.       Last depression screening scores    03/09/2022    3:38 PM 01/06/2022   11:28 AM 07/21/2021    3:28 PM  PHQ 2/9 Scores  PHQ - 2 Score 2 0 2  PHQ- 9 Score 3  3   Last fall risk screening    03/09/2022    3:39 PM  [place] in the past year? 1  Number  falls in past yr: 0  Injury with Fall? 0  Risk for fall due to : No Fall Risks  Follow up Falls evaluation completed;Education provided;Falls prevention discussed   Last Audit-C alcohol use screening    01/06/2022   11:27 AM  Alcohol Use Disorder Test (AUDIT)  1. How often do you have a drink containing alcohol? 3  2. How many drinks containing alcohol do you have on a typical day when you are drinking? 0   A score of 3 or more in women, and 4 or more in men indicates increased risk for alcohol abuse, EXCEPT if all of the points are from question 1   No results found for any visits on 09/28/22.  Assessment & Plan    Routine Health Maintenance and Physical Exam  Exercise Activities and Dietary recommendations  Goals       "I need help managing my stress" (pt-stated)      Current Barriers:  Acute Mental Health needs related to adjustment to spouse's medical condition and family conflicts  Mental Health Concerns  Suicidal Ideation/Homicidal Ideation: No  Clinical Social Work Goal(s):  Over the next 90 days, patient will work with SW bi-weekly by telephone or in person to reduce or manage symptoms related to stress management   Interventions: Status of patient's stress level discussed as well as possible triggers Confirmed that patient was doing much better due to son and daughter's surgery being a success as well as the delivery of her great grand daughter  Discussed current coping strategies used, (crafting, prayer, reaching out to friends)  exploring alternative perspectives Discussed plan to go the beach for the next month with spouse (11/30/19-12/16/19) Positive Reinforcement provided in regards to the development of a self care plan including re-arranging medical appointments in-order to extend time at the beach Discussed plans with patient for ongoing care management follow up and provided patient with direct contact information for care management team if needed in the  future Emotional/Supportive Counseling provided related to family stress experienced allowing her to vent her frustrations and celebrate her sucessess  Patient Self Care Activities:  Performs ADL's independently Performs IADL's independently Ability for insight  Patient Coping Strengths:  Supportive Relationships [place] to Communicate Effectively  Patient Self Care Deficits:  Difficulty verbalizing plan for self care  Please see past updates related to this goal by clicking on the "Past Updates" button in the selected goal        DIET - DECREASE SODA OR JUICE INTAKE      Recommend to cut back to 1 soda a day and increase water intake to 4-6 8 oz glasses a day.  DIET - EAT MORE FRUITS AND VEGETABLES      DIET - INCREASE WATER INTAKE      Recommend increasing water intake 6-8 glasses of water a day.         Immunization History  Administered Date(s) Administered   Fluad Quad(high Dose 65+) 07/21/2019, 08/08/2020, 09/02/2021, 08/25/2022   Influenza, High Dose Seasonal PF 08/25/2017, 08/13/2018   PFIZER(Purple Top)SARS-COV-2 Vaccination 01/08/2020, 02/06/2020, 08/27/2020   Pneumococcal Conjugate-13 05/17/2014   Pneumococcal Polysaccharide-23 04/11/2012   Td 04/08/2010   Tdap 04/11/2012   Zoster Recombinat (Shingrix) 06/14/2019, 09/07/2019   Zoster, Live 02/04/2009    Health Maintenance  Topic Date Due   COVID-19 Vaccine (4 - Pfizer series) 10/22/2020   TETANUS/TDAP  04/11/2022   Medicare Annual Wellness (AWV)  01/06/2023   DEXA SCAN  04/26/2024   Pneumonia Vaccine 63+ Years old  Completed   INFLUENZA VACCINE  Completed   Zoster Vaccines- Shingrix  Completed   HPV VACCINES  Aged Out   COLONOSCOPY (Pts 45-64yr Insurance coverage will need to be confirmed)  Discontinued    Discussed health benefits of physical activity, and encouraged her to engage in regular exercise appropriate for her age and condition.  Problem List Items  Addressed This Visit       Cardiovascular and Mediastinum   Essential (primary) hypertension   Relevant Medications   atorvastatin (LIPITOR) 10 MG tablet   losartan (COZAAR) 100 MG tablet   Other Relevant Orders   Comprehensive metabolic panel     Other   Mixed anxiety and depressive disorder   Relevant Medications   sertraline (ZOLOFT) 100 MG tablet   Hypercholesteremia   Relevant Medications   atorvastatin (LIPITOR) 10 MG tablet   losartan (COZAAR) 100 MG tablet   Other Relevant Orders   Lipid Panel With LDL/HDL Ratio   Overweight   Relevant Orders   Comprehensive metabolic panel   Lipid Panel With LDL/HDL Ratio   Hemoglobin A1c   Other Visit Diagnoses     Encounter for annual physical exam    -  Primary   Relevant Orders   Comprehensive metabolic panel   Lipid Panel With LDL/HDL Ratio   Hemoglobin A1c   Blood glucose elevated       Relevant Orders   Hemoglobin A1c        No follow-ups on file.     I, ALavon Paganini MD, have reviewed all documentation for this visit. The documentation on 09/28/22 for the exam, diagnosis, procedures, and orders are all accurate and complete.   Bacigalupo, ADionne Bucy MD, MPH [facility]

## 2022-09-25 NOTE — Telephone Encounter (Signed)
Requested medication (s) are due for refill today - yes  Requested medication (s) are on the active medication list -yes  Future visit scheduled -yes  Last refill: 08/28/22 #60  Notes to clinic: non delegated Rx  Requested Prescriptions  Pending Prescriptions Disp Refills   ALPRAZolam (XANAX) 0.5 MG tablet [Pharmacy Med Name: ALPRAZolam 0.5 MG Oral Tablet] 60 tablet 0    Sig: TAKE 1 TABLET BY MOUTH EVERY 12 HOURS MUST  LAST  31  DAYS     Not Delegated - Psychiatry: Anxiolytics/Hypnotics 2 Failed - 09/25/2022  4:25 PM      Failed - This refill cannot be delegated      Failed - Urine Drug Screen completed in last 360 days      Failed - Valid encounter within last 6 months    Recent Outpatient Visits           1 month ago Need for immunization against influenza   [facility], Riki Sheer, MD   6 months ago Essential (primary) hypertension   [facility], Dionne Bucy, MD   1 year ago Essential (primary) hypertension   [facility], Dionne Bucy, MD   1 year ago Encounter for annual physical exam   [facility] Bacigalupo, Dionne Bucy, MD   1 year ago Essential (primary) hypertension   [facility], Dionne Bucy, MD       Future Appointments             In 3 days Bacigalupo, Dionne Bucy, MD [facility], [place] - Patient is not pregnant         Requested Prescriptions  Pending Prescriptions Disp Refills   ALPRAZolam (XANAX) 0.5 MG tablet [Pharmacy Med Name: ALPRAZolam 0.5 MG Oral Tablet] 60 tablet 0    Sig: TAKE 1 TABLET BY MOUTH EVERY 12 HOURS MUST  LAST  35  DAYS     Not Delegated - Psychiatry: Anxiolytics/Hypnotics 2 Failed - 09/25/2022  4:25 PM      Failed - This refill cannot be delegated      Failed - Urine Drug Screen completed in last 360 days      Failed - Valid encounter within last 6 months    Recent Outpatient Visits            1 month ago Need for immunization against influenza   [facility] Simmons-[facility], Riki Sheer, MD   6 months ago Essential (primary) hypertension   [facility], Dionne Bucy, MD   1 year ago Essential (primary) hypertension   [facility], Dionne Bucy, MD   1 year ago Encounter for annual physical exam   [facility] Bacigalupo, Dionne Bucy, MD   1 year ago Essential (primary) hypertension   [place] Bacigalupo, Dionne Bucy, MD       Future Appointments             In 3 days Bacigalupo, Dionne Bucy, MD [facility], [place] - Patient is not pregnant

## 2022-09-28 ENCOUNTER — Encounter: Payer: Self-pay | Admitting: Family Medicine

## 2022-09-28 ENCOUNTER — Ambulatory Visit (INDEPENDENT_AMBULATORY_CARE_PROVIDER_SITE_OTHER): Payer: PPO | Admitting: Family Medicine

## 2022-09-28 VITALS — BP 118/75 | HR 76 | Resp 16 | Ht 69.0 in | Wt 181.0 lb

## 2022-09-28 DIAGNOSIS — E7800 Pure hypercholesterolemia, unspecified: Secondary | ICD-10-CM

## 2022-09-28 DIAGNOSIS — F418 Other specified anxiety disorders: Secondary | ICD-10-CM | POA: Diagnosis not present

## 2022-09-28 DIAGNOSIS — I10 Essential (primary) hypertension: Secondary | ICD-10-CM | POA: Diagnosis not present

## 2022-09-28 DIAGNOSIS — R739 Hyperglycemia, unspecified: Secondary | ICD-10-CM

## 2022-09-28 DIAGNOSIS — Z0000 Encounter for general adult medical examination without abnormal findings: Secondary | ICD-10-CM | POA: Diagnosis not present

## 2022-09-28 DIAGNOSIS — E663 Overweight: Secondary | ICD-10-CM

## 2022-09-28 MED ORDER — LOSARTAN POTASSIUM 100 MG PO TABS: 100.0000 mg | ORAL_TABLET | Freq: Every day | ORAL | 3 refills | Status: DC

## 2022-09-28 MED ORDER — ATORVASTATIN CALCIUM 10 MG PO TABS: 10.0000 mg | ORAL_TABLET | Freq: Every day | ORAL | 3 refills | Status: DC

## 2022-09-28 MED ORDER — SERTRALINE HCL 100 MG PO TABS: 150.0000 mg | ORAL_TABLET | Freq: Every day | ORAL | 3 refills | Status: DC

## 2022-09-28 NOTE — Assessment & Plan Note (Signed)
Well controlled Continue current medications Recheck metabolic panel F/u in 6 months  

## 2022-09-28 NOTE — Assessment & Plan Note (Addendum)
Chronic and well controlled Continue Celexa at current dose Continue low-dose Xanax as needed-have discussed risks

## 2022-09-28 NOTE — Assessment & Plan Note (Signed)
Discussed importance of healthy weight management Discussed diet and exercise  

## 2022-09-28 NOTE — Assessment & Plan Note (Signed)
Previously well controlled Continue statin Repeat FLP and CMP  

## 2022-09-29 ENCOUNTER — Encounter: Payer: Self-pay | Admitting: Family Medicine

## 2022-09-29 DIAGNOSIS — Z789 Other specified health status: Secondary | ICD-10-CM

## 2022-09-29 LAB — COMPREHENSIVE METABOLIC PANEL
ALT: 10 IU/L (ref 0–32)
AST: 13 IU/L (ref 0–40)
Albumin/Globulin Ratio: 2.5 — ABNORMAL HIGH (ref 1.2–2.2)
Albumin: 4.5 g/dL (ref 3.8–4.8)
Alkaline Phosphatase: 81 IU/L (ref 44–121)
BUN/Creatinine Ratio: 11 — ABNORMAL LOW (ref 12–28)
BUN: 11 mg/dL (ref 8–27)
Bilirubin Total: 0.7 mg/dL (ref 0.0–1.2)
CO2: 21 mmol/L (ref 20–29)
Calcium: 9 mg/dL (ref 8.7–10.3)
Chloride: 106 mmol/L (ref 96–106)
Creatinine, Ser: 1 mg/dL (ref 0.57–1.00)
Globulin, Total: 1.8 g/dL (ref 1.5–4.5)
Glucose: 99 mg/dL (ref 70–99)
Potassium: 4.1 mmol/L (ref 3.5–5.2)
Sodium: 139 mmol/L (ref 134–144)
Total Protein: 6.3 g/dL (ref 6.0–8.5)
eGFR: 57 mL/min/{1.73_m2} — ABNORMAL LOW (ref >59)

## 2022-09-29 LAB — LIPID PANEL WITH LDL/HDL RATIO
Cholesterol, Total: 137 mg/dL (ref 100–199)
HDL: 38 mg/dL — ABNORMAL LOW (ref >39)
LDL Chol Calc (NIH): 72 mg/dL (ref 0–99)
LDL/HDL Ratio: 1.9 ratio (ref 0.0–3.2)
Triglycerides: 159 mg/dL — ABNORMAL HIGH (ref 0–149)
VLDL Cholesterol Cal: 27 mg/dL (ref 5–40)

## 2022-09-29 LAB — HEMOGLOBIN A1C
Est. average glucose Bld gHb Est-mCnc: 111 mg/dL
Hgb A1c MFr Bld: 5.5 % (ref 4.8–5.6)

## 2022-10-22 DIAGNOSIS — L578 Other skin changes due to chronic exposure to nonionizing radiation: Secondary | ICD-10-CM | POA: Diagnosis not present

## 2022-10-22 DIAGNOSIS — Z86018 Personal history of other benign neoplasm: Secondary | ICD-10-CM | POA: Diagnosis not present

## 2022-10-22 DIAGNOSIS — D225 Melanocytic nevi of trunk: Secondary | ICD-10-CM | POA: Diagnosis not present

## 2022-10-22 DIAGNOSIS — Z872 Personal history of diseases of the skin and subcutaneous tissue: Secondary | ICD-10-CM | POA: Diagnosis not present

## 2022-10-22 DIAGNOSIS — L570 Actinic keratosis: Secondary | ICD-10-CM | POA: Diagnosis not present

## 2022-10-29 ENCOUNTER — Encounter: Payer: Self-pay | Admitting: Family Medicine

## 2022-10-29 DIAGNOSIS — F418 Other specified anxiety disorders: Secondary | ICD-10-CM

## 2022-10-29 MED ORDER — ALPRAZOLAM 0.5 MG PO TABS: ORAL_TABLET | ORAL | 5 refills | Status: DC

## 2022-12-23 DIAGNOSIS — H43813 Vitreous degeneration, bilateral: Secondary | ICD-10-CM | POA: Diagnosis not present

## 2022-12-23 DIAGNOSIS — H47323 Drusen of optic disc, bilateral: Secondary | ICD-10-CM | POA: Diagnosis not present

## 2022-12-23 DIAGNOSIS — H353131 Nonexudative age-related macular degeneration, bilateral, early dry stage: Secondary | ICD-10-CM | POA: Diagnosis not present

## 2023-01-11 ENCOUNTER — Ambulatory Visit (INDEPENDENT_AMBULATORY_CARE_PROVIDER_SITE_OTHER): Payer: PPO

## 2023-01-11 VITALS — BP 128/76 | Ht 69.0 in | Wt 178.7 lb

## 2023-01-11 DIAGNOSIS — Z0000 Encounter for general adult medical examination without abnormal findings: Secondary | ICD-10-CM | POA: Diagnosis not present

## 2023-01-11 NOTE — Patient Instructions (Signed)
Rita Cruz , Thank you for taking time to come for your Medicare Wellness Visit. I appreciate your ongoing commitment to your health goals. Please review the following plan we discussed and let me know if I can assist you in the future.   These are the goals we discussed:  Goals       "I need help managing my stress" (pt-stated)      Current Barriers:  Acute Mental Health needs related to adjustment to spouse's medical condition and family conflicts  Mental Health Concerns  Suicidal Ideation/Homicidal Ideation: No  Clinical Social Work Goal(s):  Over the next 90 days, patient will work with SW bi-weekly by telephone or in person to reduce or manage symptoms related to stress management   Interventions: Status of patient's stress level discussed as well as possible triggers Confirmed that patient was doing much better due to son and daughter's surgery being a success as well as the delivery of her great grand daughter  Discussed current coping strategies used, (crafting, prayer, reaching out to friends)  exploring alternative perspectives Discussed plan to go the beach for the next month with spouse (11/30/19-12/16/19) Positive Reinforcement provided in regards to the development of a self care plan including re-arranging medical appointments in-order to extend time at the beach Discussed plans with patient for ongoing care management follow up and provided patient with direct contact information for care management team if needed in the future Emotional/Supportive Counseling provided related to family stress experienced allowing her to vent her frustrations and celebrate her sucessess  Patient Self Care Activities:  Performs ADL's independently Performs IADL's independently Ability for insight  Patient Coping Strengths:  Supportive Relationships [place] to Communicate Effectively  Patient Self Care Deficits:  Difficulty verbalizing plan for self  care  Please see past updates related to this goal by clicking on the "Past Updates" button in the selected goal        DIET - DECREASE SODA OR JUICE INTAKE      Recommend to cut back to 1 soda a day and increase water intake to 4-6 8 oz glasses a day.       DIET - EAT MORE FRUITS AND VEGETABLES      DIET - INCREASE WATER INTAKE      Recommend increasing water intake 6-8 glasses of water a day.         This is a list of the screening recommended for you and due dates:  Health Maintenance  Topic Date Due   DTaP/Tdap/Td vaccine (3 - Td or Tdap) 04/11/2022   COVID-19 Vaccine (4 - 2023-24 season) 07/24/2022   Medicare Annual Wellness Visit  01/12/2024   DEXA scan (bone density measurement)  04/26/2024   Pneumonia Vaccine  Completed   Flu Shot  Completed   Zoster (Shingles) Vaccine  Completed   HPV Vaccine  Aged Out   Colon Cancer Screening  Discontinued    Advanced directives: yes  Conditions/risks identified: none  Next appointment: Follow up in one year for your annual wellness visit 01/27/2024 @11$ :00am  in person   Preventive Care 65 Years and Older, Female Preventive care refers to lifestyle choices and visits with your health care provider that can promote health and wellness. What does preventive care include? A yearly physical exam. This is also called an annual well check. Dental exams once or twice a year. Routine eye exams. Ask your health care provider how often you should have your eyes checked. Personal lifestyle choices,  including: Daily care of your teeth and gums. Regular physical activity. Eating a healthy diet. Avoiding tobacco and drug use. Limiting alcohol use. Practicing safe sex. Taking low-dose aspirin every day. Taking vitamin and mineral supplements as recommended by your health care provider. What happens during an annual well check? The services and screenings done by your health care provider during your annual well check will depend on your  age, overall health, lifestyle risk factors, and family history of disease. Counseling  Your health care provider may ask you questions about your: Alcohol use. Tobacco use. Drug use. Emotional well-being. Home and relationship well-being. Sexual activity. Eating habits. History of falls. Memory and ability to understand (cognition). Work and work Statistician. Reproductive health. Screening  You may have the following tests or measurements: Height, weight, and BMI. Blood pressure. Lipid and cholesterol levels. These may be checked every 5 years, or more frequently if you are over 76 years old. Skin check. Lung cancer screening. You may have this screening every year starting at age 8 if you have a 30-pack-year history of smoking and currently smoke or have quit within the past 15 years. Fecal occult blood test (FOBT) of the stool. You may have this test every year starting at age 70. Flexible sigmoidoscopy or colonoscopy. You may have a sigmoidoscopy every 5 years or a colonoscopy every 10 years starting at age 32. Hepatitis C blood test. Hepatitis B blood test. Sexually transmitted disease (STD) testing. Diabetes screening. This is done by checking your blood sugar (glucose) after you have not eaten for a while (fasting). You may have this done every 1-3 years. Bone density scan. This is done to screen for osteoporosis. You may have this done starting at age 7. Mammogram. This may be done every 1-2 years. Talk to your health care provider about how often you should have regular mammograms. Talk with your health care provider about your test results, treatment options, and if necessary, the need for more tests. Vaccines  Your health care provider may recommend certain vaccines, such as: Influenza vaccine. This is recommended every year. Tetanus, diphtheria, and acellular pertussis (Tdap, Td) vaccine. You may need a Td booster every 10 years. Zoster vaccine. You may need this after  age 76. Pneumococcal 13-valent conjugate (PCV13) vaccine. One dose is recommended after age 41. Pneumococcal polysaccharide (PPSV23) vaccine. One dose is recommended after age 21. Talk to your health care provider about which screenings and vaccines you need and how often you need them. This information is not intended to replace advice given to you by your health care provider. Make sure you discuss any questions you have with your health care provider. Document Released: 12/06/2015 Document Revised: 07/29/2016 Document Reviewed: 09/10/2015 Elsevier Interactive Patient Education  2017 [employer] Prevention in the Home Falls can cause injuries. They can happen to people of all ages. There are many things you can do to make your home safe and to help prevent falls. What can I do on the outside of my home? Regularly fix the edges of walkways and driveways and fix any cracks. Remove anything that might make you trip as you walk through a door, such as a raised step or threshold. Trim any bushes or trees on the path to your home. Use bright outdoor lighting. Clear any walking paths of anything that might make someone trip, such as rocks or tools. Regularly check to see if handrails are loose or broken. Make sure that both sides of any steps have  handrails. Any raised decks and porches should have guardrails on the edges. Have any leaves, snow, or ice cleared regularly. Use sand or salt on walking paths during winter. Clean up any spills in your garage right away. This includes oil or grease spills. What can I do in the bathroom? Use night lights. Install grab bars by the toilet and in the tub and shower. Do not use towel bars as grab bars. Use non-skid mats or decals in the tub or shower. If you need to sit down in the shower, use a plastic, non-slip stool. Keep the floor dry. Clean up any water that spills on the floor as soon as it happens. Remove soap buildup in the tub or shower  regularly. Attach bath mats securely with double-sided non-slip rug tape. Do not have throw rugs and other things on the floor that can make you trip. What can I do in the bedroom? Use night lights. Make sure that you have a light by your bed that is easy to reach. Do not use any sheets or blankets that are too big for your bed. They should not hang down onto the floor. Have a firm chair that has side arms. You can use this for support while you get dressed. Do not have throw rugs and other things on the floor that can make you trip. What can I do in the kitchen? Clean up any spills right away. Avoid walking on wet floors. Keep items that you use a lot in easy-to-reach places. If you need to reach something above you, use a strong step stool that has a grab bar. Keep electrical cords out of the way. Do not use floor polish or wax that makes floors slippery. If you must use wax, use non-skid floor wax. Do not have throw rugs and other things on the floor that can make you trip. What can I do with my stairs? Do not leave any items on the stairs. Make sure that there are handrails on both sides of the stairs and use them. Fix handrails that are broken or loose. Make sure that handrails are as long as the stairways. Check any carpeting to make sure that it is firmly attached to the stairs. Fix any carpet that is loose or worn. Avoid having throw rugs at the top or bottom of the stairs. If you do have throw rugs, attach them to the floor with carpet tape. Make sure that you have a light switch at the top of the stairs and the bottom of the stairs. If you do not have them, ask someone to add them for you. What else can I do to help prevent falls? Wear shoes that: Do not have high heels. Have rubber bottoms. Are comfortable and fit you well. Are closed at the toe. Do not wear sandals. If you use a stepladder: Make sure that it is fully opened. Do not climb a closed stepladder. Make sure that  both sides of the stepladder are locked into place. Ask someone to hold it for you, if possible. Clearly mark and make sure that you can see: Any grab bars or handrails. First and last steps. Where the edge of each step is. Use tools that help you move around (mobility aids) if they are needed. These include: Canes. Walkers. Scooters. Crutches. Turn on the lights when you go into a dark area. Replace any light bulbs as soon as they burn out. Set up your furniture so you have a clear path. Avoid  moving your furniture around. If any of your floors are uneven, fix them. If there are any pets around you, be aware of where they are. Review your medicines with your doctor. Some medicines can make you feel dizzy. This can increase your chance of falling. Ask your doctor what other things that you can do to help prevent falls. This information is not intended to replace advice given to you by your health care provider. Make sure you discuss any questions you have with your health care provider. Document Released: 09/05/2009 Document Revised: 04/16/2016 Document Reviewed: 12/14/2014 Elsevier Interactive Patient Education  2017 [employer].

## 2023-01-11 NOTE — Progress Notes (Signed)
Subjective:   Rita Cruz is a 82 y.o. female who presents for Medicare Annual (Subsequent) preventive examination.  Review of Systems    Cardiac Risk Factors include: advanced age (>39mn, >>66women);hypertension;dyslipidemia    Objective:    Today's Vitals   01/11/23 1132  BP: 128/76  Weight: 178 lb 11.2 oz (81.1 kg)  Height: 5' 9"$  (1.753 m)   Body mass index is 26.39 kg/m.     01/06/2022   11:32 AM 08/05/2021    9:09 AM 12/31/2020   11:02 AM 01/30/2020    8:33 AM 12/26/2019   10:57 AM 01/23/2019    8:00 PM 01/23/2019   10:39 AM  Advanced Directives  Does Patient Have a Medical Advance Directive? No Yes Yes Yes Yes Yes Yes  Type of AComptrollerLiving will  [place] Healthcare Power of [place] Does patient want to make changes to medical advance directive?  No - Patient declined    No - Patient declined   Copy of HCranein Chart?   Yes - validated most recent copy scanned in chart (See row information)  Yes - validated most recent copy scanned in chart (See row information) Yes - validated most recent copy scanned in chart (See row information) Yes - validated most recent copy scanned in chart (See row information)  Would patient like information on creating a medical advance directive? No - Patient declined          Current Medications (verified) Outpatient Encounter Medications as of 01/11/2023  Medication Sig   ALPRAZolam (XANAX) 0.5 MG tablet TAKE 1 TABLET BY MOUTH EVERY 12 HOURS MUST  LAST  30  DAYS   aspirin 81 MG EC tablet Take 81 mg by mouth at bedtime.    atorvastatin (LIPITOR) 10 MG tablet Take 1 tablet (10 mg total) by mouth daily.   fluticasone (FLONASE) 50 MCG/ACT nasal spray Place 2 sprays into both nostrils daily as needed for allergies.   GENTEAL TEARS 0.1-0.3 % SOLN as needed.    losartan (COZAAR) 100 MG tablet Take 1 tablet (100 mg  total) by mouth daily.   omeprazole (PRILOSEC) 20 MG capsule Take 1 capsule (20 mg total) by mouth daily.   sertraline (ZOLOFT) 100 MG tablet Take 1.5 tablets (150 mg total) by mouth daily.   acetaminophen (TYLENOL) 325 MG tablet Take 650 mg by mouth every 6 (six) hours as needed for moderate pain.   No facility-administered encounter medications on file as of 01/11/2023.    Allergies (verified) Morphine and related, Percocet  [oxycodone-acetaminophen], Levaquin [levofloxacin in d5w], Lisinopril, Meloxicam, and Penicillins   History: Past Medical History:  Diagnosis Date   Adenomatous polyps    Anxiety    Arthritis    neck   Bronchitis 02/04/2018   Capsulitis of toe of right foot    Chest pain    Adenosine Myoview in 2006 done for atypical chest pain, showed no eviden e for ischemia or infarct   Colon polyps    GERD (gastroesophageal reflux disease)    History of colonic polyps    HTN (hypertension)    Hyperlipidemia    Postoperative nausea and vomiting 01/06/2022   PUD (peptic ulcer disease)    with hx of H. Pylori   Past Surgical History:  Procedure Laterality Date   ABDOMINAL HYSTERECTOMY     BREAST BIOPSY Right 2011   benign mass done in  Dr. [place] office   BREAST BIOPSY Right 06/29/2015   top hat marker. PROLIFERATIVE FIBROCYSTIC CHANGE WITH ASSOCIATED    CERVICAL DISCECTOMY     CHOLECYSTECTOMY     COLONOSCOPY WITH PROPOFOL N/A 10/06/2016   Procedure: COLONOSCOPY WITH PROPOFOL;  Surgeon: Lollie Sails, MD;  Location: [facility] ENDOSCOPY;  Service: Endoscopy;  Laterality: N/A;   COLONOSCOPY WITH PROPOFOL N/A 12/30/2018   Procedure: COLONOSCOPY WITH PROPOFOL;  Surgeon: Lollie Sails, MD;  Location: [facility] ENDOSCOPY;  Service: Endoscopy;  Laterality: N/A;   COLONOSCOPY WITH PROPOFOL N/A 01/30/2020   Procedure: COLONOSCOPY WITH PROPOFOL;  Surgeon: Lucilla Lame, MD;  Location[facility] ENDOSCOPY;  Service: Endoscopy;  Laterality: N/A;   EYE SURGERY     FRACTURE SURGERY Right  05/21/2020   fell while at the beach. Proximal humerus fracture   hysterectomy-unspecified area     LAPAROSCOPIC RIGHT COLECTOMY Right 01/23/2019   Procedure: LAPAROSCOPIC RIGHT COLECTOMY;  Surgeon: Robert Bellow, MD;  Location: [facility] ORS;  Service: General;  Laterality: Right;   Family History  Problem Relation Age of Onset   Kidney failure Father    Heart failure Father    Diabetes Father    COPD Mother    Colon cancer Mother 25   Heart attack Mother    Ulcers Mother    Osteoporosis Mother    Macular degeneration Mother    Hypertension Brother    Irritable bowel syndrome Daughter    Colon polyps Daughter    Kidney Stones Son    Bipolar disorder Son    Melanoma Son        stage IV   Hypertension Son    Kidney cancer Other    Breast cancer Neg Hx    Social History   Socioeconomic History   Marital status: Married    Spouse name: Not on file   Number of children: 2   Years of education: Not on file   Highest education level: Some college, no degree  Occupational History   Occupation: retired  Tobacco Use   Smoking status: Never   Smokeless tobacco: Never   Tobacco comments:    does not smoke  Vaping Use   Vaping Use: Never used  Substance and Sexual Activity   Alcohol use: Yes    Alcohol/week: 7.0 standard drinks of alcohol    Types: 7 Glasses of wine per week    Comment: 1 glass of wine nightly   Drug use: No   Sexual activity: Not Currently    Birth control/protection: Surgical    Comment: Hysterectomy  Other Topics Concern   Not on file  Social History Narrative   Married-2nd marriage. She has 2 children and her husband has 2 children.    Social Determinants of Health   Financial Resource Strain: Medium Risk (01/11/2023)   Overall Financial Resource Strain (CARDIA)    Difficulty of Paying Living Expenses: Somewhat hard  Food Insecurity: No Food Insecurity (01/11/2023)   Hunger Vital Sign    Worried About Running Out of Food in the Last Year: Never  true    Ran Out of Food in the Last Year: Never true  Transportation Needs: No Transportation Needs (01/11/2023)   PRAPARE - Hydrologist (Medical): No    Lack of Transportation (Non-Medical): No  Physical Activity: Sufficiently Active (01/11/2023)   Exercise Vital Sign    Days of Exercise per Week: 7 days    Minutes of Exercise per Session: 30 min  Stress:  Stress Concern Present (01/11/2023)   [place]    Feeling of Stress : To some extent  Social Connections: Socially Integrated (01/11/2023)   Social Connection and Isolation Panel [NHANES]    Frequency of Communication with Friends and Family: More than three times a week    Frequency of Social Gatherings with Friends and Family: More than three times a week    Attends Religious Services: More than 4 times per year    Active Member of [employer] or Organizations: Yes    Attends Music therapist: More than 4 times per year    Marital Status: Married    Tobacco Counseling Counseling given: Not Answered Tobacco comments: does not smoke   Clinical Intake:  Pre-visit preparation completed: Yes  Pain : No/denies pain     BMI - recorded: 26.39 Nutritional Status: BMI 25 -29 Overweight Nutritional Risks: None Diabetes: No  How often do you need to have someone help you when you read instructions, pamphlets, or other written materials from your doctor or pharmacy?: 1 - Never  Diabetic?NO Interpreter Needed?: No  Information entered by :: B.Belva Koziel,LPN   Activities of Daily Living    01/11/2023   11:48 AM 09/28/2022    2:50 PM  In your present state of health, do you have any difficulty performing the following activities:  Hearing? 1 1  Comment one ear (rt)   Vision? 0 1  Difficulty concentrating or making decisions? 0 0  Walking or climbing stairs? 0 0  Dressing or bathing? 0 0  Doing errands, shopping? 0 0   Preparing Food and eating ? N   Using the Toilet? N   In the past six months, have you accidently leaked urine? N   Do you have problems with loss of bowel control? N   Managing your Medications? N   Managing your Finances? N   Housekeeping or managing your Housekeeping? N     Patient Care Team: Virginia Crews, MD as PCP - General (Family Medicine) Birder Robson, MD as Referring Physician (Ophthalmology) Sharlet Salina, MD as Referring Physician (Physical Medicine and Rehabilitation) Margaretha Sheffield, MD (Otolaryngology) Lucilla Lame, MD as Consulting Physician (Gastroenterology) Bary Castilla Forest Gleason, MD as Consulting Physician (General Surgery)  Indicate any recent Medical Services you may have received from other than [facility] providers in the past year (date may be approximate).     Assessment:   This is a routine wellness examination for Jasmine Awe.  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 w/recent visit  Dietary issues and exercise activities discussed: Exercise limited by: None identified   Goals Addressed               This Visit's Progress     "I need help managing my stress" (pt-stated)   On track     Current Barriers:  Acute Mental Health needs related to adjustment to spouse's medical condition and family conflicts  Mental Health Concerns  Suicidal Ideation/Homicidal Ideation: No  Clinical Social Work Goal(s):  Over the next 90 days, patient will work with SW bi-weekly by telephone or in person to reduce or manage symptoms related to stress management   Interventions: Status of patient's stress level discussed as well as possible triggers Confirmed that patient was doing much better due to son and daughter's surgery being a  success as well as the delivery of her great grand  daughter  Discussed current coping strategies used, (crafting, prayer, reaching out to friends)  exploring alternative perspectives Discussed plan to go the beach for the next month with spouse (11/30/19-12/16/19) Positive Reinforcement provided in regards to the development of a self care plan including re-arranging medical appointments in-order to extend time at the beach Discussed plans with patient for ongoing care management follow up and provided patient with direct contact information for care management team if needed in the future Emotional/Supportive Counseling provided related to family stress experienced allowing her to vent her frustrations and celebrate her sucessess  Patient Self Care Activities:  Performs ADL's independently Performs IADL's independently Ability for insight  Patient Coping Strengths:  Supportive [place] to Communicate Effectively  Patient Self Care Deficits:  Difficulty verbalizing plan for self care  Please see past updates related to this goal by clicking on the "Past Updates" button in the selected goal        DIET - DECREASE SODA OR JUICE INTAKE   On track     Recommend to cut back to 1 soda a day and increase water intake to 4-6 8 oz glasses a day.       DIET - EAT MORE FRUITS AND VEGETABLES   On track     DIET - INCREASE WATER INTAKE   On track     Recommend increasing water intake 6-8 glasses of water a day.        Depression Screen    01/11/2023   11:41 AM 09/28/2022    2:49 PM 03/09/2022    3:38 PM 01/06/2022   11:28 AM 07/21/2021    3:28 PM 01/16/2021    3:56 PM 12/31/2020   10:58 AM  PHQ 2/9 Scores  PHQ - 2 Score 1 1 2 $ 0 2 1 0  PHQ- 9 Score  2 3  3 1     $ Fall Risk    01/11/2023   11:37 AM 09/28/2022    2:49 PM 03/09/2022    3:39 PM 01/06/2022   11:33 AM 07/21/2021    3:29 PM  Fall Risk   Falls in the past year? 0 1 1 0 1  Number falls in past yr: 0 0 0 0 0  Injury with Fall? 0 0 0 0 0  Risk  for fall due to : No Fall Risks  No Fall Risks No Fall Risks History of fall(s)  Follow up Education provided;Falls prevention discussed  Falls evaluation completed;Education provided;Falls prevention discussed Falls evaluation completed Falls evaluation completed;Education provided    FALL RISK PREVENTION PERTAINING TO THE HOME:  Any stairs in or around the home? No  If so, are there any without handrails? No  Home free of loose throw rugs in walkways, pet beds, electrical cords, etc? Yes  Adequate lighting in your home to reduce risk of falls? Yes   ASSISTIVE DEVICES UTILIZED TO PREVENT FALLS:  Life alert? No  Use of a cane, walker or w/c? No  Grab bars in the bathroom? Yes  Shower chair or bench in shower? Yes  Elevated toilet seat or a handicapped toilet? Yes   TIMED UP AND GO:  Was the test performed? Yes .  Length of time to ambulate 10 feet: 9 sec.   Gait steady and fast without use of assistive device  Cognitive Function:        01/11/2023   11:54 AM 12/26/2019   11:07  AM 12/20/2018   10:37 AM 12/07/2017   10:56 AM 12/08/2016    9:32 AM  6CIT Screen  What Year? 0 points 0 points 0 points 0 points 0 points  What month? 0 points 0 points 0 points 0 points 0 points  What time? 0 points 0 points 0 points 0 points 0 points  Count back from 20 0 points 0 points 0 points 0 points 0 points  Months in reverse 0 points 0 points 0 points 0 points 0 points  Repeat phrase 0 points 0 points 2 points 0 points 0 points  Total Score 0 points 0 points 2 points 0 points 0 points    Immunizations Immunization History  Administered Date(s) Administered   Fluad Quad(high Dose 65+) 07/21/2019, 08/08/2020, 09/02/2021, 08/25/2022   Influenza, High Dose Seasonal PF 08/25/2017, 08/13/2018   PFIZER(Purple Top)SARS-COV-2 Vaccination 01/08/2020, 02/06/2020, 08/27/2020   Pneumococcal Conjugate-13 05/17/2014   Pneumococcal Polysaccharide-23 04/11/2012   Td 04/08/2010   Tdap 04/11/2012    Zoster Recombinat (Shingrix) 06/14/2019, 09/07/2019   Zoster, Live 02/04/2009    TDAP status: Up to date  Flu Vaccine status: Up to date  Pneumococcal vaccine status: Up to date  Covid-19 vaccine status: Completed vaccines  Qualifies for Shingles Vaccine? Yes   Zostavax completed Yes   Shingrix Completed?: Yes  Screening Tests Health Maintenance  Topic Date Due   DTaP/Tdap/Td (3 - Td or Tdap) 04/11/2022   COVID-19 Vaccine (4 - 2023-24 season) 07/24/2022   Medicare Annual Wellness (AWV)  01/12/2024   DEXA SCAN  04/26/2024   Pneumonia Vaccine 44+ Years old  Completed   INFLUENZA VACCINE  Completed   Zoster Vaccines- Shingrix  Completed   HPV VACCINES  Aged Out   COLONOSCOPY (Pts 45-74yr Insurance coverage will need to be confirmed)  Discontinued    Health Maintenance  Health Maintenance Due  Topic Date Due   DTaP/Tdap/Td (3 - Td or Tdap) 04/11/2022   COVID-19 Vaccine (4 - 2023-24 season) 07/24/2022    Colorectal cancer screening: Type of screening: Colonoscopy. Completed yes. Repeat every 5 years  Mammogram status: Completed yes. Repeat every year  Bone Density status: Completed yes. Results reflect: Bone density results: OSTEOPENIA. Repeat every 5 years.  Lung Cancer Screening: (Low Dose CT Chest recommended if Age 81-80years, 30 pack-year currently smoking OR have quit w/in 15years.) does not qualify.   Lung Cancer Screening Referral: no  Additional Screening:  Hepatitis C Screening: does not qualify; Completed no  Vision Screening: Recommended annual ophthalmology exams for early detection of glaucoma and other disorders of the eye. Is the patient up to date with their annual eye exam?  Yes  Who is the provider or what is the name of the office in which the patient attends annual eye exams? [facility] pt is not established with a provider, would they like to be referred to a provider to establish care? No .   Dental Screening: Recommended annual dental  exams for proper oral hygiene  Community Resource Referral / Chronic Care Management: CRR required this visit?  No   CCM required this visit?  No      Plan:     I have personally reviewed and noted the following in the patient's chart:   Medical and social history Use of alcohol, tobacco or illicit drugs  Current medications and supplements including opioid prescriptions. Patient is not currently taking opioid prescriptions. Functional ability and status Nutritional status Physical activity Advanced directives List of other physicians  Hospitalizations, surgeries, and ER visits in previous 12 months Vitals Screenings to include cognitive, depression, and falls Referrals and appointments  In addition, I have reviewed and discussed with patient certain preventive protocols, quality metrics, and best practice recommendations. A written personalized care plan for preventive services as well as general preventive health recommendations were provided to patient.     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 She is on Zoloft (which helps) but denies any other needs/resources for herself at this time. States she is coping well most days since her husband has [place] now 12 hrs weekly.

## 2023-01-25 ENCOUNTER — Telehealth: Payer: Self-pay

## 2023-01-25 NOTE — Telephone Encounter (Signed)
Copied from [place] [id number]. Topic: General - Other >> [name] 22, 2023 11:14 AM [place] B wrote: Reason for CRM: Lennette Bihari from [employer] called, states needs lates office notes of patients, so they can get diabetic shoes

## 2023-02-10 DIAGNOSIS — Z86010 Personal history of colonic polyps: Secondary | ICD-10-CM | POA: Diagnosis not present

## 2023-02-24 ENCOUNTER — Encounter
Admission: RE | Disposition: A | Discharge status: Home / Self Care | Payer: Self-pay | Source: Home / Self Care | Attending: Surgery

## 2023-02-24 ENCOUNTER — Ambulatory Visit
Admission: RE | Admit: 2023-02-24 | Discharge: 2023-02-24 | Disposition: A | Discharge status: Home / Self Care | Payer: PPO | Attending: Surgery | Admitting: Surgery

## 2023-02-24 ENCOUNTER — Ambulatory Visit: Payer: PPO | Admitting: Certified Registered Nurse Anesthetist

## 2023-02-24 DIAGNOSIS — K5730 Diverticulosis of large intestine without perforation or abscess without bleeding: Secondary | ICD-10-CM | POA: Diagnosis not present

## 2023-02-24 DIAGNOSIS — K649 Unspecified hemorrhoids: Secondary | ICD-10-CM | POA: Diagnosis not present

## 2023-02-24 DIAGNOSIS — D122 Benign neoplasm of ascending colon: Secondary | ICD-10-CM | POA: Insufficient documentation

## 2023-02-24 DIAGNOSIS — K219 Gastro-esophageal reflux disease without esophagitis: Secondary | ICD-10-CM | POA: Diagnosis not present

## 2023-02-24 DIAGNOSIS — E785 Hyperlipidemia, unspecified: Secondary | ICD-10-CM | POA: Diagnosis not present

## 2023-02-24 DIAGNOSIS — F419 Anxiety disorder, unspecified: Secondary | ICD-10-CM | POA: Insufficient documentation

## 2023-02-24 DIAGNOSIS — Z09 Encounter for follow-up examination after completed treatment for conditions other than malignant neoplasm: Secondary | ICD-10-CM | POA: Insufficient documentation

## 2023-02-24 DIAGNOSIS — Z86010 Personal history of colonic polyps: Secondary | ICD-10-CM | POA: Diagnosis not present

## 2023-02-24 DIAGNOSIS — K640 First degree hemorrhoids: Secondary | ICD-10-CM | POA: Diagnosis not present

## 2023-02-24 DIAGNOSIS — Z1211 Encounter for screening for malignant neoplasm of colon: Secondary | ICD-10-CM | POA: Diagnosis not present

## 2023-02-24 DIAGNOSIS — K635 Polyp of colon: Secondary | ICD-10-CM | POA: Diagnosis not present

## 2023-02-24 HISTORY — PX: COLONOSCOPY WITH PROPOFOL: SHX5780

## 2023-02-24 SURGERY — COLONOSCOPY WITH PROPOFOL
Anesthesia: General

## 2023-02-24 MED ORDER — LIDOCAINE HCL (CARDIAC) PF 100 MG/5ML IV SOSY
PREFILLED_SYRINGE | INTRAVENOUS | Status: DC | PRN
  Administered 2023-02-24: 50 mg via INTRAVENOUS

## 2023-02-24 MED ORDER — LIDOCAINE HCL (PF) 2 % IJ SOLN
INTRAMUSCULAR | Status: AC
  Filled 2023-02-24: qty 5

## 2023-02-24 MED ORDER — PROPOFOL 500 MG/50ML IV EMUL
INTRAVENOUS | Status: DC | PRN
  Administered 2023-02-24: 100 ug/kg/min via INTRAVENOUS

## 2023-02-24 MED ORDER — PROPOFOL 1000 MG/100ML IV EMUL
INTRAVENOUS | Status: AC
  Filled 2023-02-24: qty 100

## 2023-02-24 MED ORDER — PROPOFOL 10 MG/ML IV BOLUS
INTRAVENOUS | Status: DC | PRN
  Administered 2023-02-24: 70 mg via INTRAVENOUS

## 2023-02-24 MED ORDER — ONDANSETRON HCL 4 MG/2ML IJ SOLN
INTRAMUSCULAR | Status: DC | PRN
  Administered 2023-02-24: 4 mg via INTRAVENOUS

## 2023-02-24 MED ORDER — SODIUM CHLORIDE 0.9 % IV SOLN: INTRAVENOUS | Status: DC

## 2023-02-24 NOTE — Interval H&P Note (Signed)
History and Physical Interval Note:  02/24/2023 8:25 AM  Rita Cruz  has presented today for surgery, with the diagnosis of history of colon polyps Z86.010.  The various methods of treatment have been discussed with the patient and family. After consideration of risks, benefits and other options for treatment, the patient has consented to  Procedure(s): COLONOSCOPY WITH PROPOFOL (N/A) as a surgical intervention.  The patient's history has been reviewed, patient examined, no change in status, stable for surgery.  I have reviewed the patient's chart and labs.  Questions were answered to the patient's satisfaction.     Belinda Bringhurst Lysle Pearl

## 2023-02-24 NOTE — Transfer of Care (Signed)
Immediate Anesthesia Transfer of Care Note  Patient: Rita Cruz  Procedure(s) Performed: COLONOSCOPY WITH PROPOFOL  Patient Location: Endoscopy Unit  Anesthesia Type:General  Level of Consciousness: sedated  Airway & Oxygen Therapy: Patient Spontanous Breathing  Post-op Assessment: Report given to RN and Post -op Vital signs reviewed and stable  Post vital signs: Reviewed and stable  Last Vitals:  Vitals Value Taken Time  BP 124/73 02/24/23 0907  Temp 35.9 C 02/24/23 0907  Pulse 60 02/24/23 0907  Resp 16 02/24/23 0907  SpO2 98 % 02/24/23 0907    Last Pain:  Vitals:   02/24/23 0907  TempSrc: Temporal  PainSc: Asleep         Complications: No notable events documented.

## 2023-02-24 NOTE — Op Note (Signed)
[facility] Gastroenterology Patient Name: Rita Cruz Procedure Date: 02/24/2023 7:37 AM MRN: [id number] Account #: [id number] Date of Birth: 08-Nov-1941 Admit Type: Outpatient Age: 82 Room: [name] Gender: Female Note Status: Finalized Instrument Name: Colonoscope [id number],Colonoscope [id number] Procedure:             Colonoscopy Indications:           High risk colon cancer surveillance: Personal history                         of colonic polyps Providers:             Benjamine Sprague MD, MD Referring MD:          Dionne Bucy. Bacigalupo (Referring MD) Medicines:             Propofol per Anesthesia Complications:         No immediate complications. Procedure:             Pre-Anesthesia Assessment:                        - After reviewing the risks and benefits, the patient                         was deemed in satisfactory condition to undergo the                         procedure in an ambulatory setting.                        After obtaining informed consent, the colonoscope was                         passed under direct vision. Throughout the procedure,                         the patient's blood pressure, pulse, and oxygen                         saturations were monitored continuously. The                         Colonoscope was introduced through the anus and                         advanced to the the cecum, identified by the ileocecal                         valve. The Colonoscope was introduced through the and                         advanced to the. The colonoscopy was somewhat                         difficult due to a tortuous colon. The patient                         tolerated the procedure well. The quality of the bowel  preparation was fair. Findings:      The perianal and digital rectal examinations were normal.      Many medium-mouthed diverticula were found in the sigmoid colon.      Non-bleeding internal  hemorrhoids were found during retroflexion. The       hemorrhoids were Grade I (internal hemorrhoids that do not prolapse).      A 3 mm polyp was found in the ascending colon. The polyp was       semi-pedunculated. The polyp was removed with a cold snare. Resection       and retrieval were complete. Estimated blood loss was minimal.      A 3 mm polyp was found in the ascending colon. The polyp was       semi-pedunculated. Biopsies were taken with a cold forceps for       histology. Estimated blood loss was minimal. Impression:            - Preparation of the colon was fair.                        - Diverticulosis in the sigmoid colon.                        - Non-bleeding internal hemorrhoids.                        - One 3 mm polyp in the ascending colon, removed with                         a cold snare. Resected and retrieved.                        - One 3 mm polyp in the ascending colon. Biopsied. Recommendation:        - Await pathology results.                        - Written discharge instructions were provided to the                         patient.                        - Discharge patient to home.                        - Resume previous diet. Procedure Code(s):     --- Professional ---                        [phone], Colonoscopy, flexible; with removal of                         tumor(s), polyp(s), or other lesion(s) by snare                         technique                        [id number], 59, Colonoscopy, flexible; with biopsy, single                         or multiple  Diagnosis Code(s):     --- Professional ---                        Z86.010, Personal history of colonic polyps                        K64.0, First degree hemorrhoids                        D12.2, Benign neoplasm of ascending colon                        K57.30, Diverticulosis of large intestine without                         perforation or abscess without bleeding CPT copyright 2022 American Medical  Association. All rights reserved. The codes documented in this report are preliminary and upon coder review may  be revised to meet current compliance requirements. Dr. Sheppard Penton, MD Benjamine Sprague MD, MD 02/24/2023 9:09:50 AM This report has been signed electronically. Number of Addenda: 0 Note Initiated On: 02/24/2023 7:37 AM Scope Withdrawal Time: 0 hours 18 minutes 4 seconds  Total Procedure Duration: 0 hours 27 minutes 12 seconds  Estimated Blood Loss:  Estimated blood loss was minimal.      [facility]

## 2023-02-24 NOTE — Anesthesia Postprocedure Evaluation (Signed)
Anesthesia Post Note  Patient: Rita Cruz  Procedure(s) Performed: COLONOSCOPY WITH PROPOFOL  Patient location during evaluation: Endoscopy Anesthesia Type: General Level of consciousness: awake and alert Pain management: pain level controlled Vital Signs Assessment: post-procedure vital signs reviewed and stable Respiratory status: spontaneous breathing, nonlabored ventilation, respiratory function stable and patient connected to nasal cannula oxygen Cardiovascular status: blood pressure returned to baseline and stable Postop Assessment: no apparent nausea or vomiting Anesthetic complications: no   No notable events documented.   Last Vitals:  Vitals:   02/24/23 0917 02/24/23 0927  BP: (!) 139/57 (!) 153/70  Pulse: 62 (!) 59  Resp: (!) 22 17  Temp:    SpO2: 98% 100%    Last Pain:  Vitals:   02/24/23 0927  TempSrc:   PainSc: 0-No pain                 Precious Haws Briceyda Abdullah

## 2023-02-24 NOTE — Anesthesia Preprocedure Evaluation (Signed)
Anesthesia Evaluation  Patient identified by MRN, date of birth, ID band Patient awake    Reviewed: Allergy & Precautions, NPO status , Patient's Chart, lab work & pertinent test results  History of Anesthesia Complications (+) PONV and history of anesthetic complications  Airway Mallampati: III  TM Distance: <3 FB Neck ROM: full    Dental  (+) Chipped, Caps   Pulmonary neg pulmonary ROS, neg shortness of breath   Pulmonary exam normal        Cardiovascular Exercise Tolerance: Good hypertension, (-) angina negative cardio ROS Normal cardiovascular exam     Neuro/Psych  PSYCHIATRIC DISORDERS      negative neurological ROS     GI/Hepatic Neg liver ROS, PUD,GERD  Controlled,,  Endo/Other  negative endocrine ROS    Renal/GU negative Renal ROS  negative genitourinary   Musculoskeletal   Abdominal   Peds  Hematology negative hematology ROS (+)   Anesthesia Other Findings Past Medical History: No date: Adenomatous polyps No date: Anxiety No date: Arthritis     Comment:  neck 02/04/2018: Bronchitis No date: Capsulitis of toe of right foot No date: Chest pain     Comment:  Adenosine Myoview in 2006 done for atypical chest pain,               showed no eviden e for ischemia or infarct No date: Colon polyps No date: GERD (gastroesophageal reflux disease) No date: History of colonic polyps No date: HTN (hypertension) No date: Hyperlipidemia 01/06/2022: Postoperative nausea and vomiting No date: PUD (peptic ulcer disease)     Comment:  with hx of H. Pylori  Past Surgical History: No date: ABDOMINAL HYSTERECTOMY 2011: BREAST BIOPSY; Right     Comment:  benign mass done in Dr. [place] office 06/29/2015: BREAST BIOPSY; Right     Comment:  top hat marker. PROLIFERATIVE FIBROCYSTIC CHANGE WITH               ASSOCIATED  No date: CERVICAL DISCECTOMY No date: CHOLECYSTECTOMY 10/06/2016: COLONOSCOPY WITH PROPOFOL;  N/A     Comment:  Procedure: COLONOSCOPY WITH PROPOFOL;  Surgeon: Lollie Sails, MD;  Location: [facility] ENDOSCOPY;  Service:               Endoscopy;  Laterality: N/A; 12/30/2018: COLONOSCOPY WITH PROPOFOL; N/A     Comment:  Procedure: COLONOSCOPY WITH PROPOFOL;  Surgeon:               Lollie Sails, MD;  Location: [facility] ENDOSCOPY;                Service: Endoscopy;  Laterality: N/A; 01/30/2020: COLONOSCOPY WITH PROPOFOL; N/A     Comment:  Procedure: COLONOSCOPY WITH PROPOFOL;  Surgeon: Lucilla Lame, MD;  Location: [facility] ENDOSCOPY;  Service:               Endoscopy;  Laterality: N/A; No date: EYE SURGERY 05/21/2020: FRACTURE SURGERY; Right     Comment:  [place]. Proximal humerus fracture No date: hysterectomy-unspecified area 01/23/2019: LAPAROSCOPIC RIGHT COLECTOMY; Right     Comment:  Procedure: LAPAROSCOPIC RIGHT COLECTOMY;  Surgeon:               Robert Bellow, MD;  Location: [facility] ORS;  Service:  General;  Laterality: Right;  BMI    Body Mass Index: 26.14 kg/m      Reproductive/Obstetrics negative OB ROS                             Anesthesia Physical Anesthesia Plan  ASA: 2  Anesthesia Plan: General   Post-op Pain Management:    Induction: Intravenous  PONV Risk Score and Plan: Propofol infusion and TIVA  Airway Management Planned: Natural Airway and Nasal Cannula  Additional Equipment:   Intra-op Plan:   Post-operative Plan:   Informed Consent: I have reviewed the patients History and Physical, chart, labs and discussed the procedure including the risks, benefits and alternatives for the proposed anesthesia with the patient or authorized representative who has indicated his/her understanding and acceptance.     Dental Advisory Given  Plan Discussed with: Anesthesiologist, CRNA and Surgeon  Anesthesia Plan Comments: (Patient consented for risks of anesthesia including but  not limited to:  - adverse reactions to medications - risk of airway placement if required - damage to eyes, teeth, lips or other oral mucosa - nerve damage due to positioning  - sore throat or hoarseness - Damage to heart, brain, nerves, lungs, other parts of body or loss of life  Patient voiced understanding.)       Anesthesia Quick Evaluation

## 2023-02-24 NOTE — Anesthesia Procedure Notes (Signed)
Date/Time: 02/24/2023 8:32 AM  Performed by: Johnna Acosta, CRNAPre-anesthesia Checklist: Patient identified, Emergency Drugs available, Suction available, Patient being monitored and Timeout performed Patient Re-evaluated:Patient Re-evaluated prior to induction Oxygen Delivery Method: Nasal cannula Preoxygenation: Pre-oxygenation with 100% oxygen Induction Type: IV induction

## 2023-02-24 NOTE — H&P (Signed)
Subjective:  CC: History of colon polyps [Z86.010]  HPI: Rita Cruz is a 82 y.o. female who presents for evaluation of above. Asymptomatic. No new issues  Last cscope 01/2020, sessile serrate polyp  Past Medical History: has a past medical history of Anxiety, Arthritis of neck, Colon polyp, GERD (gastroesophageal reflux disease), and Hot flashes.  Past Surgical History: has a past surgical history that includes titanium cervical plate; Cholecystectomy; Breast excisional biopsy; Abdominal hysterectomy; Colonoscopy (10/06/2016); and Colonoscopy (12/30/2018). Hemi colectomy  Family History: family history includes Colon cancer in her mother.  Social History: reports that she has never smoked. She has never used smokeless tobacco. She reports current alcohol use. No history on file for drug use.  Current Medications: has a current medication list which includes the following prescription(s): acetaminophen, alprazolam, aspirin, atorvastatin, fluticasone propionate, losartan, omeprazole, and sertraline.  Allergies: Allergies Allergen Reactions Oxycodone-Acetaminophen Other (See Comments) Hallucinations Hallucinations Levofloxacin In D5w Diarrhea Vaginal itching Meloxicam Diarrhea Upset stomach Penicillins Itching Lisinopril Dermatitis cough  ROS: A 15 point review of systems was performed and pertinent positives and negatives noted in HPI  Objective:   BP 125/86  Pulse 87  Ht 175.3 cm (5\' 9" )  Wt 81.2 kg (179 lb)  BMI 26.43 kg/m  Constitutional : No distress, cooperative, alert Lymphatics/Throat: Supple with no lymphadenopathy Respiratory: Clear to auscultation bilaterally Cardiovascular: Regular rate and rhythm Gastrointestinal: Soft, non-tender, non-distended, no organomegaly. Musculoskeletal: Steady gait and movement Skin: Cool and moist Psychiatric: Normal affect, non-agitated, not confused    LABS: N/a  RADS: N/a  Assessment:   History of colon  polyps [Z86.010]  Plan:   1. History of colon polyps [Z86.010] R/b/a discussed. Risks include bleeding, perforation. Benefits include diagnostic, curative procedure if needed. Alternatives include continued observation. Pt verbalized understanding. Will schedule  labs/images/medications/previous chart entries reviewed personally and relevant changes/updates noted above.

## 2023-02-25 ENCOUNTER — Encounter: Payer: Self-pay | Admitting: Surgery

## 2023-02-25 LAB — SURGICAL PATHOLOGY

## 2023-03-10 DIAGNOSIS — M5416 Radiculopathy, lumbar region: Secondary | ICD-10-CM | POA: Diagnosis not present

## 2023-03-10 DIAGNOSIS — M5136 Other intervertebral disc degeneration, lumbar region: Secondary | ICD-10-CM | POA: Diagnosis not present

## 2023-03-10 DIAGNOSIS — M5134 Other intervertebral disc degeneration, thoracic region: Secondary | ICD-10-CM | POA: Diagnosis not present

## 2023-03-10 DIAGNOSIS — M546 Pain in thoracic spine: Secondary | ICD-10-CM | POA: Diagnosis not present

## 2023-03-10 DIAGNOSIS — M545 Low back pain, unspecified: Secondary | ICD-10-CM | POA: Diagnosis not present

## 2023-03-12 ENCOUNTER — Other Ambulatory Visit: Payer: Self-pay | Admitting: Family Medicine

## 2023-03-12 NOTE — Telephone Encounter (Signed)
Requested Prescriptions  Pending Prescriptions Disp Refills   omeprazole (PRILOSEC) 20 MG capsule [Pharmacy Med Name: OMEPRAZOLE DR 20 MG CAP] 90 capsule 0    Sig: TAKE 1 CAPSULE BY MOUTH ONCE DAILY     Gastroenterology: Proton Pump Inhibitors Passed - 03/12/2023  9:25 AM      Passed - Valid encounter within last 12 months    Recent Outpatient Visits           5 months ago Encounter for annual physical exam   [place] [facility] [place], Marzella Schlein, MD   6 months ago Need for immunization against influenza   [place] [facility] Simmons-Robinson, Tawanna Cooler, MD   1 year ago Essential (primary) hypertension   [place] [facility] [place], Marzella Schlein, MD   1 year ago Essential (primary) hypertension   [place] [facility] [place], Marzella Schlein, MD   1 year ago Encounter for annual physical exam   [facility] [place], Marzella Schlein, MD

## 2023-03-30 ENCOUNTER — Encounter: Payer: Self-pay | Admitting: Surgery

## 2023-04-06 DIAGNOSIS — H903 Sensorineural hearing loss, bilateral: Secondary | ICD-10-CM | POA: Diagnosis not present

## 2023-04-06 DIAGNOSIS — J301 Allergic rhinitis due to pollen: Secondary | ICD-10-CM | POA: Diagnosis not present

## 2023-04-06 DIAGNOSIS — H6983 Other specified disorders of Eustachian tube, bilateral: Secondary | ICD-10-CM | POA: Diagnosis not present

## 2023-04-27 DIAGNOSIS — H903 Sensorineural hearing loss, bilateral: Secondary | ICD-10-CM | POA: Diagnosis not present

## 2023-05-03 ENCOUNTER — Other Ambulatory Visit: Payer: Self-pay | Admitting: Family Medicine

## 2023-05-03 DIAGNOSIS — F418 Other specified anxiety disorders: Secondary | ICD-10-CM

## 2023-05-31 ENCOUNTER — Other Ambulatory Visit: Payer: Self-pay | Admitting: Family Medicine

## 2023-05-31 DIAGNOSIS — F418 Other specified anxiety disorders: Secondary | ICD-10-CM

## 2023-06-03 ENCOUNTER — Encounter: Payer: Self-pay | Admitting: Family Medicine

## 2023-06-03 ENCOUNTER — Other Ambulatory Visit: Payer: Self-pay | Admitting: Family Medicine

## 2023-06-03 DIAGNOSIS — F418 Other specified anxiety disorders: Secondary | ICD-10-CM

## 2023-06-03 NOTE — Telephone Encounter (Signed)
Requested medication (s) are due for refill today: Yes  Requested medication (s) are on the active medication list: Yes  Last refill:  05/03/23  Future visit scheduled: No  Notes to clinic:  Unable to refill per protocol, cannot delegate.      Requested Prescriptions  Pending Prescriptions Disp Refills   ALPRAZolam (XANAX) 0.5 MG tablet 60 tablet 0    Sig: TAKE 1 TABLET BY MOUTH EVERY 12 HOURS MUST  LAST  30  DAYS     Not Delegated - Psychiatry: Anxiolytics/Hypnotics 2 Failed - 06/03/2023 11:37 AM      Failed - This refill cannot be delegated      Failed - Urine Drug Screen completed in last 360 days      Passed - Patient is not pregnant      Passed - Valid encounter within last 6 months    Recent Outpatient Visits           8 months ago Encounter for annual physical exam   [place] [facility] [place], Marzella Schlein, MD   9 months ago Need for immunization against influenza   [place] [facility] Simmons-Robinson, [place], MD   1 year ago Essential (primary) hypertension   [place] [facility] [place], Marzella Schlein, MD   1 year ago Essential (primary) hypertension    [facility] [place], Marzella Schlein, MD   1 year ago Encounter for annual physical exam   [facility] [place], Marzella Schlein, MD

## 2023-06-03 NOTE — Telephone Encounter (Signed)
Medication Refill - Medication: ALPRAZolam (XANAX) 0.5 MG tablet   Has the patient contacted their pharmacy? Yes.    Preferred Pharmacy (with phone number or street name):  [facility] - [place], [place] - [address] Phone: [phone]  Fax: [phone]     Has the patient been seen for an appointment in the last year OR does the patient have an upcoming appointment? Yes.    Agent: Please be advised that RX refills may take up to 3 business days. We ask that you follow-up with your pharmacy.

## 2023-08-11 ENCOUNTER — Other Ambulatory Visit: Payer: Self-pay | Admitting: Family Medicine

## 2023-08-13 ENCOUNTER — Other Ambulatory Visit: Payer: Self-pay | Admitting: Family Medicine

## 2023-08-13 DIAGNOSIS — Z1231 Encounter for screening mammogram for malignant neoplasm of breast: Secondary | ICD-10-CM

## 2023-08-25 ENCOUNTER — Ambulatory Visit
Admission: RE | Admit: 2023-08-25 | Discharge: 2023-08-25 | Disposition: A | Discharge status: Home / Self Care | Payer: PPO | Source: Ambulatory Visit | Attending: Family Medicine | Admitting: Family Medicine

## 2023-08-25 DIAGNOSIS — Z1231 Encounter for screening mammogram for malignant neoplasm of breast: Secondary | ICD-10-CM | POA: Insufficient documentation

## 2023-08-27 ENCOUNTER — Other Ambulatory Visit: Payer: Self-pay | Admitting: Family Medicine

## 2023-08-27 DIAGNOSIS — F418 Other specified anxiety disorders: Secondary | ICD-10-CM

## 2023-08-27 NOTE — Telephone Encounter (Signed)
Requested medication (s) are due for refill today - no  Requested medication (s) are on the active medication list -yes  Future visit scheduled -no  Last refill: 06/04/23 #60 2RF  Notes to clinic: non delegated Rx  Requested Prescriptions  Pending Prescriptions Disp Refills   ALPRAZolam (XANAX) 0.5 MG tablet [Pharmacy Med Name: ALPRAZOLAM 0.5 MG TAB] 60 tablet     Sig: TAKE 1 TABLET BY MOUTH EVERY 12 HOURS MUST LAST 30 DAYS     Not Delegated - Psychiatry: Anxiolytics/Hypnotics 2 Failed - 08/27/2023 11:53 AM      Failed - This refill cannot be delegated      Failed - Urine Drug Screen completed in last 360 days      Failed - Valid encounter within last 6 months    Recent Outpatient Visits           11 months ago Encounter for annual physical exam   [place] [facility] [place], Marzella Schlein, MD   1 year ago Need for immunization against influenza   [place] [facility] Simmons-Robinson, [place], MD   1 year ago Essential (primary) hypertension   [place] [facility] [place], Marzella Schlein, MD   1 year ago Essential (primary) hypertension   [place] [facility] [place], Marzella Schlein, MD   2 years ago Encounter for annual physical exam   [place] [facility] [place], Marzella Schlein, MD              Passed - Patient is not pregnant         Requested Prescriptions  Pending Prescriptions Disp Refills   ALPRAZolam (XANAX) 0.5 MG tablet [Pharmacy Med Name: ALPRAZOLAM 0.5 MG TAB] 60 tablet     Sig: TAKE 1 TABLET BY MOUTH EVERY 12 HOURS MUST LAST 30 DAYS     Not Delegated - Psychiatry: Anxiolytics/Hypnotics 2 Failed - 08/27/2023 11:53 AM      Failed - This refill cannot be delegated      Failed - Urine Drug Screen completed in last 360 days      Failed - Valid encounter within last 6 months    Recent Outpatient Visits           11 months ago Encounter for annual physical exam   [facility] [place], Marzella Schlein, MD   1 year ago Need for immunization against influenza   [place] [facility] Simmons-Robinson, [place], MD   1 year ago Essential (primary) hypertension   [place] [facility] [place], Marzella Schlein, MD   1 year ago Essential (primary) hypertension   [place] [facility] [place], Marzella Schlein, MD   2 years ago Encounter for annual physical exam   [place] [facility] [place], Marzella Schlein, MD              Passed - Patient is not pregnant

## 2023-08-31 ENCOUNTER — Encounter: Payer: Self-pay | Admitting: Family Medicine

## 2023-09-10 ENCOUNTER — Other Ambulatory Visit: Payer: Self-pay | Admitting: Family Medicine

## 2023-09-10 NOTE — Telephone Encounter (Signed)
Requested Prescriptions  Pending Prescriptions Disp Refills   omeprazole (PRILOSEC) 20 MG capsule [Pharmacy Med Name: OMEPRAZOLE DR 20 MG CAP] 90 capsule 0    Sig: TAKE 1 CAPSULE BY MOUTH ONCE DAILY     Gastroenterology: Proton Pump Inhibitors Passed - 09/10/2023  1:11 PM      Passed - Valid encounter within last 12 months    Recent Outpatient Visits           11 months ago Encounter for annual physical exam   [place] [facility] [place], Marzella Schlein, MD   1 year ago Need for immunization against influenza   [place] [facility] Simmons-Robinson, Tawanna Cooler, MD   1 year ago Essential (primary) hypertension   [place] [facility] [place], Marzella Schlein, MD   2 years ago Essential (primary) hypertension   [place] [facility] [place], Marzella Schlein, MD   2 years ago Encounter for annual physical exam   [facility] [place], Marzella Schlein, MD

## 2023-09-21 ENCOUNTER — Other Ambulatory Visit: Payer: Self-pay | Admitting: Family Medicine

## 2023-09-22 NOTE — Telephone Encounter (Signed)
Requested medication (s) are due for refill today:   Yes  Requested medication (s) are on the active medication list:   Yes  Future visit scheduled:   No   Last ordered: 09/28/2022  #90, 3 refills  Returned because labs will be due in Nov. Even though they are in date now.   Provider to review for continued refill.      Requested Prescriptions  Pending Prescriptions Disp Refills   atorvastatin (LIPITOR) 10 MG tablet [Pharmacy Med Name: ATORVASTATIN CALCIUM 10 MG TAB] 90 tablet 3    Sig: TAKE ONE TABLET BY MOUTH ONCE DAILY     Cardiovascular:  Antilipid - Statins Failed - 09/21/2023 12:37 PM      Failed - Lipid Panel in normal range within the last 12 months    Cholesterol, Total  Date Value Ref Range Status  09/28/2022 137 100 - 199 mg/dL Final   LDL Chol Calc (NIH)  Date Value Ref Range Status  09/28/2022 72 0 - 99 mg/dL Final   HDL  Date Value Ref Range Status  09/28/2022 38 (L) >39 mg/dL Final   Triglycerides  Date Value Ref Range Status  09/28/2022 159 (H) 0 - 149 mg/dL Final         Passed - Patient is not pregnant      Passed - Valid encounter within last 12 months    Recent Outpatient Visits           11 months ago Encounter for annual physical exam   [name], Marzella Schlein, MD   1 year ago Need for immunization against influenza   [name], [place], MD   1 year ago Essential (primary) hypertension   [name], Marzella Schlein, MD   2 years ago Essential (primary) hypertension   [name], Marzella Schlein, MD   2 years ago Encounter for annual physical exam   [name], Marzella Schlein, MD

## 2023-10-02 ENCOUNTER — Other Ambulatory Visit: Payer: Self-pay | Admitting: Family Medicine

## 2023-10-02 DIAGNOSIS — F418 Other specified anxiety disorders: Secondary | ICD-10-CM

## 2023-10-04 NOTE — Telephone Encounter (Signed)
Requested medication (s) are due for refill today: yes  Requested medication (s) are on the active medication list: yes  Last refill:  08/31/23  Future visit scheduled: no  Notes to clinic:  Unable to refill per protocol, cannot delegate.      Requested Prescriptions  Pending Prescriptions Disp Refills   ALPRAZolam (XANAX) 0.5 MG tablet [Pharmacy Med Name: ALPRAZOLAM 0.5 MG TAB] 60 tablet     Sig: TAKE 1 TABLET BY MOUTH EVERY 12 HOURS MUST LAST 30 DAYS     Not Delegated - Psychiatry: Anxiolytics/Hypnotics 2 Failed - 10/02/2023  7:58 AM      Failed - This refill cannot be delegated      Failed - Urine Drug Screen completed in last 360 days      Failed - Valid encounter within last 6 months    Recent Outpatient Visits           1 year ago Encounter for annual physical exam   [place] [facility] [place], Marzella Schlein, MD   1 year ago Need for immunization against influenza   [place] [facility] Simmons-Robinson, Tawanna Cooler, MD   1 year ago Essential (primary) hypertension   [place] [facility] [place], Marzella Schlein, MD   2 years ago Essential (primary) hypertension   [place] [facility] [place], Marzella Schlein, MD   2 years ago Encounter for annual physical exam    [place] [place], Marzella Schlein, MD              Passed - Patient is not pregnant

## 2023-10-27 DIAGNOSIS — L578 Other skin changes due to chronic exposure to nonionizing radiation: Secondary | ICD-10-CM | POA: Diagnosis not present

## 2023-10-27 DIAGNOSIS — Z872 Personal history of diseases of the skin and subcutaneous tissue: Secondary | ICD-10-CM | POA: Diagnosis not present

## 2023-10-27 DIAGNOSIS — Z86018 Personal history of other benign neoplasm: Secondary | ICD-10-CM | POA: Diagnosis not present

## 2023-11-01 ENCOUNTER — Other Ambulatory Visit: Payer: Self-pay | Admitting: Family Medicine

## 2023-11-01 DIAGNOSIS — F418 Other specified anxiety disorders: Secondary | ICD-10-CM

## 2023-11-29 ENCOUNTER — Other Ambulatory Visit: Payer: Self-pay | Admitting: Family Medicine

## 2023-12-01 NOTE — Telephone Encounter (Signed)
 Requested Prescriptions  Pending Prescriptions Disp Refills   omeprazole  (PRILOSEC) 20 MG capsule [Pharmacy Med Name: OMEPRAZOLE  DR 20 MG CAP] 90 capsule 0    Sig: TAKE 1 CAPSULE BY MOUTH ONCE DAILY     Gastroenterology: Proton Pump Inhibitors Passed - 12/01/2023  8:10 AM      Passed - Valid encounter within last 12 months    Recent Outpatient Visits           1 year ago Encounter for annual physical exam   [place] [facility] [place], Jon HERO, MD   1 year ago Need for immunization against influenza   [place] [facility] Simmons-Robinson, [place], MD   1 year ago Essential (primary) hypertension   [place] [facility] [place], Jon HERO, MD   2 years ago Essential (primary) hypertension    [facility] [place], Jon HERO, MD   2 years ago Encounter for annual physical exam   [facility] [place], Jon HERO, MD

## 2023-12-04 ENCOUNTER — Other Ambulatory Visit: Payer: Self-pay | Admitting: Family Medicine

## 2023-12-04 DIAGNOSIS — F418 Other specified anxiety disorders: Secondary | ICD-10-CM

## 2023-12-06 NOTE — Telephone Encounter (Signed)
 Requested medication (s) are due for refill today: no  Requested medication (s) are on the active medication list: yes  Last refill:  11/01/23 #60/1  Future visit scheduled: no  Notes to clinic:  not delegated, pt needs appt     Requested Prescriptions  Pending Prescriptions Disp Refills   ALPRAZolam  (XANAX ) 0.5 MG tablet [Pharmacy Med Name: ALPRAZOLAM  0.5 MG TAB] 60 tablet     Sig: TAKE 1 TABLET BY MOUTH EVERY 12 HOURS **MUST LAST 30 DAYS**     Not Delegated - Psychiatry: Anxiolytics/Hypnotics 2 Failed - 12/06/2023  2:18 PM      Failed - This refill cannot be delegated      Failed - Urine Drug Screen completed in last 360 days      Failed - Valid encounter within last 6 months    Recent Outpatient Visits           1 year ago Encounter for annual physical exam   [place] [facility] [place], Jon HERO, MD   1 year ago Need for immunization against influenza   [place] [facility] Simmons-Robinson, [place], MD   1 year ago Essential (primary) hypertension   [place] [facility] [place], Jon HERO, MD   2 years ago Essential (primary) hypertension   [place] [facility] [place], Jon HERO, MD   2 years ago Encounter for annual physical exam   [place] [facility] [place], Jon HERO, MD              Passed - Patient is not pregnant

## 2023-12-15 DIAGNOSIS — B029 Zoster without complications: Secondary | ICD-10-CM | POA: Diagnosis not present

## 2023-12-15 DIAGNOSIS — J9801 Acute bronchospasm: Secondary | ICD-10-CM | POA: Diagnosis not present

## 2024-01-18 ENCOUNTER — Ambulatory Visit (INDEPENDENT_AMBULATORY_CARE_PROVIDER_SITE_OTHER): Payer: PPO

## 2024-01-18 VITALS — BP 120/74 | Ht 69.0 in | Wt 174.4 lb

## 2024-01-18 DIAGNOSIS — Z0000 Encounter for general adult medical examination without abnormal findings: Secondary | ICD-10-CM

## 2024-01-18 NOTE — Progress Notes (Signed)
 Subjective:   Rita Cruz is a 83 y.o. who presents for a Medicare Wellness preventive visit.  Visit Complete: In person   AWV Questionnaire: No: Patient Medicare AWV questionnaire was not completed prior to this visit.  Cardiac Risk Factors include: advanced age (>67men, >67 women);hypertension;dyslipidemia     Objective:    Today's Vitals   01/18/24 1600  BP: 120/74  Weight: 174 lb 6.4 oz (79.1 kg)  Height: 5\' 9"  (1.753 m)   Body mass index is 25.75 kg/m.     01/18/2024    4:16 PM 02/24/2023    8:04 AM 01/11/2023   12:01 PM 01/06/2022   11:32 AM 08/05/2021    9:09 AM 12/31/2020   11:02 AM 01/30/2020    8:33 AM  Advanced Directives  Does Patient Have a Medical Advance Directive? No Yes Yes No Yes Yes Yes  Type of Careers adviser;Living will   Does patient want to make changes to medical advance directive?     No - Patient declined    Copy of Healthcare Power of Attorney in Chart?      Yes - validated most recent copy scanned in chart (See row information)   Would patient like information on creating a medical advance directive? No - Patient declined   No - Patient declined       Current Medications (verified) Outpatient Encounter Medications as of 01/18/2024  Medication Sig   acetaminophen (TYLENOL) 325 MG tablet Take 650 mg by mouth every 6 (six) hours as needed for moderate pain.   ALPRAZolam (XANAX) 0.5 MG tablet TAKE 1 TABLET BY MOUTH EVERY 12 HOURS **MUST LAST 30 DAYS**   aspirin 81 MG EC tablet Take 81 mg by mouth at bedtime.    atorvastatin (LIPITOR) 10 MG tablet TAKE ONE TABLET BY MOUTH ONCE DAILY   fluticasone (FLONASE) 50 MCG/ACT nasal spray Place 2 sprays into both nostrils daily as needed for allergies.   GENTEAL TEARS 0.1-0.3 % SOLN as needed.    losartan (COZAAR) 100 MG tablet TAKE ONE TABLET EVERY DAY   omeprazole (PRILOSEC) 20 MG capsule TAKE 1 CAPSULE BY MOUTH ONCE DAILY   sertraline (ZOLOFT) 100 MG tablet TAKE  1 1/2 TABLETS EVERY DAY   No facility-administered encounter medications on file as of 01/18/2024.    Allergies (verified) Morphine and codeine, Percocet  [oxycodone-acetaminophen], Levaquin [levofloxacin in d5w], Lisinopril, Meloxicam, Oxycodone, and Penicillins   History: Past Medical History:  Diagnosis Date   Adenomatous polyps    Anxiety    Arthritis    neck   Bronchitis 02/04/2018   Capsulitis of toe of right foot    Chest pain    Adenosine Myoview in 2006 done for atypical chest pain, showed no eviden e for ischemia or infarct   Colon polyps    GERD (gastroesophageal reflux disease)    History of colonic polyps    HTN (hypertension)    Hyperlipidemia    Postoperative nausea and vomiting 01/06/2022   PUD (peptic ulcer disease)    with hx of H. Pylori   Past Surgical History:  Procedure Laterality Date   ABDOMINAL HYSTERECTOMY     BREAST BIOPSY Right 2011   benign mass done in Dr. Arvella Nigh office   BREAST BIOPSY Right 06/29/2015   top hat marker. PROLIFERATIVE FIBROCYSTIC CHANGE WITH ASSOCIATED    CERVICAL DISCECTOMY     CHOLECYSTECTOMY     COLONOSCOPY WITH PROPOFOL N/A 10/06/2016  Procedure: COLONOSCOPY WITH PROPOFOL;  Surgeon: Christena Deem, MD;  Location: [facility] ENDOSCOPY;  Service: Endoscopy;  Laterality: N/A;   COLONOSCOPY WITH PROPOFOL N/A 12/30/2018   Procedure: COLONOSCOPY WITH PROPOFOL;  Surgeon: Christena Deem, MD;  Location: [facility] ENDOSCOPY;  Service: Endoscopy;  Laterality: N/A;   COLONOSCOPY WITH PROPOFOL N/A 01/30/2020   Procedure: COLONOSCOPY WITH PROPOFOL;  Surgeon: Midge Minium, MD;  Location: [facility] ENDOSCOPY;  Service: Endoscopy;  Laterality: N/A;   COLONOSCOPY WITH PROPOFOL N/A 02/24/2023   Procedure: COLONOSCOPY WITH PROPOFOL;  Surgeon: Sung Amabile, DO;  Location: [facility] ENDOSCOPY;  Service: General;  Laterality: N/A;   EYE SURGERY     FRACTURE SURGERY Right 05/21/2020   [place]. Proximal humerus fracture   hysterectomy-unspecified  area     LAPAROSCOPIC RIGHT COLECTOMY Right 01/23/2019   Procedure: LAPAROSCOPIC RIGHT COLECTOMY;  Surgeon: Earline Mayotte, MD;  Location: [facility] ORS;  Service: General;  Laterality: Right;   Family History  Problem Relation Age of Onset   Kidney failure Father    Heart failure Father    Diabetes Father    COPD Mother    Colon cancer Mother 52   Heart attack Mother    Ulcers Mother    Osteoporosis Mother    Macular degeneration Mother    Hypertension Brother    Irritable bowel syndrome Daughter    Colon polyps Daughter    Kidney Stones Son    Bipolar disorder Son    Melanoma Son        stage IV   Hypertension Son    Kidney cancer Other    Breast cancer Neg Hx    Social History   Socioeconomic History   Marital status: Married    Spouse name: Not on file   Number of children: 2   Years of education: Not on file   Highest education level: Some college, no degree  Occupational History   Occupation: retired  Tobacco Use   Smoking status: Never   Smokeless tobacco: Never   Tobacco comments:    does not smoke  Vaping Use   Vaping status: Never Used  Substance and Sexual Activity   Alcohol use: Yes    Alcohol/week: 7.0 standard drinks of alcohol    Types: 7 Glasses of wine per week    Comment: 1 glass of wine nightly   Drug use: No   Sexual activity: Not Currently    Birth control/protection: Surgical    Comment: Hysterectomy  Other Topics Concern   Not on file  Social History Narrative   Married-2nd marriage. She has 2 children and her husband has 2 children.    Social Drivers of Corporate investment banker Strain: Low Risk  (01/18/2024)   Overall Financial Resource Strain (CARDIA)    Difficulty of Paying Living Expenses: Not very hard  Food Insecurity: No Food Insecurity (01/18/2024)   Hunger Vital Sign    Worried About Running Out of Food in the Last Year: Never true    Ran Out of Food in the Last Year: Never true  Transportation Needs: No Transportation  Needs (01/18/2024)   PRAPARE - Administrator, Civil Service (Medical): No    Lack of Transportation (Non-Medical): No  Physical Activity: Insufficiently Active (01/18/2024)   Exercise Vital Sign    Days of Exercise per Week: 3 days    Minutes of Exercise per Session: 30 min  Stress: Stress Concern Present (01/18/2024)   [employer] of Occupational  Health - Occupational Stress Questionnaire    Feeling of Stress : To some extent  Social Connections: Moderately Integrated (01/18/2024)   Social Connection and Isolation Panel [NHANES]    Frequency of Communication with Friends and Family: More than three times a week    Frequency of Social Gatherings with Friends and Family: More than three times a week    Attends Religious Services: More than 4 times per year    Active Member of [employer] or Organizations: Yes    Attends Banker Meetings: More than 4 times per year    Marital Status: Widowed    Tobacco Counseling Counseling given: Not Answered Tobacco comments: does not smoke    Clinical Intake:  Pre-visit preparation completed: Yes  Pain : No/denies pain     BMI - recorded: 25.75 Nutritional Status: BMI 25 -29 Overweight Nutritional Risks: None Diabetes: No  How often do you need to have someone help you when you read instructions, pamphlets, or other written materials from your doctor or pharmacy?: 1 - Never  Interpreter Needed?: No  Information entered by :: Kennedy Bucker, LPN   Activities of Daily Living     01/18/2024    4:20 PM 01/18/2024   11:20 AM  In your present state of health, do you have any difficulty performing the following activities:  Hearing? 1 1  Vision? 0 0  Difficulty concentrating or making decisions? 0 0  Walking or climbing stairs? 0 0  Dressing or bathing? 0 0  Doing errands, shopping? 0 0  Preparing Food and eating ? N N  Using the Toilet? N N  In the past six months, have you accidently leaked urine? Y Y  Do  you have problems with loss of bowel control? Y Y  Managing your Medications? N N  Managing your Finances? N N  Housekeeping or managing your Housekeeping? N N    Patient Care Team: Erasmo Downer, MD as PCP - General (Family Medicine) Galen Manila, MD as Referring Physician (Ophthalmology) Merri Ray, MD as Referring Physician (Physical Medicine and Rehabilitation) Vernie Murders, MD (Otolaryngology) Midge Minium, MD as Consulting Physician (Gastroenterology) Lemar Livings Merrily Pew, MD as Consulting Physician (General Surgery) Galen Manila, MD as Referring Physician (Ophthalmology)  Indicate any recent Medical Services you may have received from other than [facility] providers in the past year (date may be approximate).     Assessment:   This is a routine wellness examination for Rita Cruz.  Hearing/Vision screen Hearing Screening - Comments:: WEARS AIDS, BOTH EARS Vision Screening - Comments:: READERS, HAD CATARACT SGY, HAS MACULAR DEGENERATION- DR.PORFILIO   Goals Addressed             This Visit's Progress    Cut out extra servings         Depression Screen     01/18/2024    4:12 PM 01/11/2023   11:41 AM 09/28/2022    2:49 PM 03/09/2022    3:38 PM 01/06/2022   11:28 AM 07/21/2021    3:28 PM 01/16/2021    3:56 PM  PHQ 2/9 Scores  PHQ - 2 Score 4 1 1 2  0 2 1  PHQ- 9 Score 8  2 3  3 1     Fall Risk     01/18/2024    4:20 PM 01/18/2024   11:20 AM 01/11/2023   11:37 AM 09/28/2022    2:49 PM 03/09/2022    3:39 PM  Fall Risk   Falls in the past year?  0 0 0 1 1  Number falls in past yr: 0  0 0 0  Injury with Fall? 0  0 0 0  Risk for fall due to : No Fall Risks  No Fall Risks  No Fall Risks  Follow up Falls prevention discussed;Falls evaluation completed  Education provided;Falls prevention discussed  Falls evaluation completed;Education provided;Falls prevention discussed    MEDICARE RISK AT HOME:  Medicare Risk at Home Any stairs in or around the home?:  No If so, are there any without handrails?: No Home free of loose throw rugs in walkways, pet beds, electrical cords, etc?: Yes Adequate lighting in your home to reduce risk of falls?: Yes Life alert?: No Use of a cane, walker or w/c?: No Grab bars in the bathroom?: Yes Shower chair or bench in shower?: Yes Elevated toilet seat or a handicapped toilet?: Yes  TIMED UP AND GO:  Was the test performed?  Yes  Length of time to ambulate 10 feet: 4 sec Gait steady and fast without use of assistive device  Cognitive Function: 6CIT completed        01/18/2024    4:23 PM 01/11/2023   11:54 AM 12/26/2019   11:07 AM 12/20/2018   10:37 AM 12/07/2017   10:56 AM  6CIT Screen  What Year? 0 points 0 points 0 points 0 points 0 points  What month? 0 points 0 points 0 points 0 points 0 points  What time? 0 points 0 points 0 points 0 points 0 points  Count back from 20 0 points 0 points 0 points 0 points 0 points  Months in reverse 0 points 0 points 0 points 0 points 0 points  Repeat phrase 0 points 0 points 0 points 2 points 0 points  Total Score 0 points 0 points 0 points 2 points 0 points    Immunizations Immunization History  Administered Date(s) Administered   Fluad Quad(high Dose 65+) 07/21/2019, 08/08/2020, 09/02/2021, 08/25/2022   Influenza, High Dose Seasonal PF 08/25/2017, 08/13/2018   Influenza-Unspecified 08/23/2023   PFIZER(Purple Top)SARS-COV-2 Vaccination 01/08/2020, 02/06/2020, 08/27/2020   Pneumococcal Conjugate-13 05/17/2014   Pneumococcal Polysaccharide-23 04/11/2012   Td 04/08/2010   Tdap 04/11/2012   Zoster Recombinant(Shingrix) 06/14/2019, 09/07/2019   Zoster, Live 02/04/2009    Screening Tests Health Maintenance  Topic Date Due   DTaP/Tdap/Td (3 - Td or Tdap) 04/11/2022   COVID-19 Vaccine (4 - 2024-25 season) 07/25/2023   DEXA SCAN  04/26/2024   Medicare Annual Wellness (AWV)  01/17/2025   Pneumonia Vaccine 82+ Years old  Completed   INFLUENZA VACCINE   Completed   Zoster Vaccines- Shingrix  Completed   HPV VACCINES  Aged Out   Colonoscopy  Discontinued    Health Maintenance  Health Maintenance Due  Topic Date Due   DTaP/Tdap/Td (3 - Td or Tdap) 04/11/2022   COVID-19 Vaccine (4 - 2024-25 season) 07/25/2023   Health Maintenance Items Addressed:   Dewayne Hatch TO DISCUSS MAMMOGRAM W/ MD DECLINED BDS REFERRAL  Additional Screening:  Vision Screening: Recommended annual ophthalmology exams for early detection of glaucoma and other disorders of the eye.  Dental Screening: Recommended annual dental exams for proper oral hygiene  Community Resource Referral / Chronic Care Management: CRR required this visit?  No   CCM required this visit?  No     Plan:     I have personally reviewed and noted the following in the patient's chart:   Medical and social history Use of alcohol, tobacco or illicit drugs  Current medications and supplements including opioid prescriptions. Patient is not currently taking opioid prescriptions. Functional ability and status Nutritional status Physical activity Advanced directives List of other physicians Hospitalizations, surgeries, and ER visits in previous 12 months Vitals Screenings to include cognitive, depression, and falls Referrals and appointments  In addition, I have reviewed and discussed with patient certain preventive protocols, quality metrics, and best practice recommendations. A written personalized care plan for preventive services as well as general preventive health recommendations were provided to patient.     Hal Hope, LPN   03/01/8118   After Visit Summary: (In Person-Declined) Patient declined AVS at this time.  Notes: Nothing significant to report at this time.

## 2024-01-18 NOTE — Patient Instructions (Addendum)
 Rita Cruz , Thank you for taking time to come for your Medicare Wellness Visit. I appreciate your ongoing commitment to your health goals. Please review the following plan we discussed and let me know if I can assist you in the future.   Referrals/Orders/Follow-Ups/Clinician Recommendations: NONE  This is a list of the screening recommended for you and due dates:  Health Maintenance  Topic Date Due   DTaP/Tdap/Td vaccine (3 - Td or Tdap) 04/11/2022   COVID-19 Vaccine (4 - 2024-25 season) 07/25/2023   DEXA scan (bone density measurement)  04/26/2024   Medicare Annual Wellness Visit  01/17/2025   Pneumonia Vaccine  Completed   Flu Shot  Completed   Zoster (Shingles) Vaccine  Completed   HPV Vaccine  Aged Out   Colon Cancer Screening  Discontinued    Advanced directives: (ACP Link)Information on Advanced Care Planning can be found at [facility] Advance Health Care Directives Advance Health Care Directives ([email or web address]   Next Medicare Annual Wellness Visit scheduled for next year: Yes   01/23/25 @ 3:10 PM IN PERSON

## 2024-01-19 ENCOUNTER — Ambulatory Visit (INDEPENDENT_AMBULATORY_CARE_PROVIDER_SITE_OTHER): Payer: PPO | Admitting: Family Medicine

## 2024-01-19 ENCOUNTER — Encounter: Payer: Self-pay | Admitting: Family Medicine

## 2024-01-19 VITALS — BP 130/70 | HR 89 | Temp 98.1°F | Resp 16 | Ht 69.0 in | Wt 174.0 lb

## 2024-01-19 DIAGNOSIS — J301 Allergic rhinitis due to pollen: Secondary | ICD-10-CM | POA: Diagnosis not present

## 2024-01-19 DIAGNOSIS — J329 Chronic sinusitis, unspecified: Secondary | ICD-10-CM

## 2024-01-19 DIAGNOSIS — J069 Acute upper respiratory infection, unspecified: Secondary | ICD-10-CM | POA: Diagnosis not present

## 2024-01-19 DIAGNOSIS — R052 Subacute cough: Secondary | ICD-10-CM | POA: Diagnosis not present

## 2024-01-19 MED ORDER — FLUTICASONE PROPIONATE 50 MCG/ACT NA SUSP
2.0000 | Freq: Every day | NASAL | 3 refills | Status: AC | PRN
Start: 2024-01-19 — End: ?

## 2024-01-19 MED ORDER — BENZONATATE 100 MG PO CAPS
100.0000 mg | ORAL_CAPSULE | Freq: Three times a day (TID) | ORAL | 0 refills | Status: DC | PRN
Start: 2024-01-19 — End: 2024-06-08

## 2024-01-19 MED ORDER — LORATADINE 10 MG PO TABS: 10.0000 mg | ORAL_TABLET | Freq: Every day | ORAL | 11 refills | Status: DC

## 2024-01-19 NOTE — Patient Instructions (Signed)
 Try the claritin and flonase and cough med tessalon.  I think it would also be helpful to try another 5-7 days of mucinex as well.  If you do not start to improve I want you to get the chest x-ray done and get a follow up appointment.

## 2024-01-19 NOTE — Progress Notes (Unsigned)
 Patient ID: Rita Cruz, female    DOB: 02-22-1941, 83 y.o.   MRN: [id number]  PCP: Erasmo Downer, MD  Chief Complaint  Patient presents with   Sinusitis    x3 weeks, took Mucinex for 7-9 days then stopped.    Subjective:   Rita Cruz is a 83 y.o. female, presents to clinic with CC of the following:  HPI  Pt recently had shingles and went to the walk in clinic at [facility] and was treated with steroids and antiviral and a few days later she got a sore throat and she's felt sick since then  She took a home test for covid and flu and was negative She has not had a fever but she has continue sx, nasal congested, drainage post nasal drip, coughing, her son was concerned he's heard wheeze, some SOB fatigue Some sweats at the start of her sx, sx started at least 4 weeks ago, no recent fever or change to sweats Appetite not completely normal She sounds congested and nasally     Patient Active Problem List   Diagnosis Date Noted   Eustachian tube dysfunction, right 03/09/2022   History of fusion of cervical spine 01/06/2022   History of right hemicolectomy 01/06/2022   Closed fracture of humerus 01/06/2022   History of bilateral cataract extraction 01/06/2022   History of cholecystectomy 01/06/2022   History of hysterectomy 01/06/2022   Irritable bowel syndrome 01/06/2022   Varicose veins of lower extremity 01/06/2022   Stress incontinence of urine 01/06/2022   Change in bowel habits 07/21/2021   History of colonic polyps    Polyp of descending colon    Overweight 01/24/2020   Osteopenia of multiple sites 01/24/2020   Pelvic organ prolapse quantification stage 1 cystocele 07/25/2019   Female stress incontinence 07/25/2019   Tubulovillous adenoma polyp of colon 01/23/2019   Adenomatous polyp of ascending colon 01/18/2019   Microcalcification of both breasts on mammogram 02/04/2018   Allergic rhinitis 05/23/2015   Arthropathy, lower leg 05/23/2015    Benign neoplasm of colon 05/23/2015   MDD (major depressive disorder) 05/23/2015   Essential (primary) hypertension 05/23/2015   Gastroesophageal reflux disease 05/23/2015   Mixed anxiety and depressive disorder 05/23/2015   Hypercholesteremia 05/23/2015   Symptomatic menopausal or female climacteric states 05/23/2015   Arthritis, degenerative 05/23/2015   Mixed incontinence 03/24/2013      Current Outpatient Medications:    acetaminophen (TYLENOL) 325 MG tablet, Take 650 mg by mouth every 6 (six) hours as needed for moderate pain., Disp: , Rfl:    ALPRAZolam (XANAX) 0.5 MG tablet, TAKE 1 TABLET BY MOUTH EVERY 12 HOURS **MUST LAST 30 DAYS**, Disp: 60 tablet, Rfl: 1   aspirin 81 MG EC tablet, Take 81 mg by mouth at bedtime. , Disp: , Rfl:    atorvastatin (LIPITOR) 10 MG tablet, TAKE ONE TABLET BY MOUTH ONCE DAILY, Disp: 90 tablet, Rfl: 3   fluticasone (FLONASE) 50 MCG/ACT nasal spray, Place 2 sprays into both nostrils daily as needed for allergies., Disp: 48 g, Rfl: 3   GENTEAL TEARS 0.1-0.3 % SOLN, as needed. , Disp: , Rfl:    losartan (COZAAR) 100 MG tablet, TAKE ONE TABLET EVERY DAY, Disp: 90 tablet, Rfl: 3   omeprazole (PRILOSEC) 20 MG capsule, TAKE 1 CAPSULE BY MOUTH ONCE DAILY, Disp: 90 capsule, Rfl: 0   sertraline (ZOLOFT) 100 MG tablet, TAKE 1 1/2 TABLETS EVERY DAY, Disp: 135 tablet, Rfl: 2   Allergies  Allergen Reactions  Morphine And Codeine Other (See Comments)    Hallucinations    Percocet  [Oxycodone-Acetaminophen] Other (See Comments)    Hallucinations Hallucinations   Levaquin [Levofloxacin In D5w] Diarrhea    Vaginal itching   Lisinopril     cough   Meloxicam     Upset stomach   Oxycodone     Other Reaction(s): Not available   Penicillins Itching     Social History   Tobacco Use   Smoking status: Never   Smokeless tobacco: Never   Tobacco comments:    does not smoke  Vaping Use   Vaping status: Never Used  Substance Use Topics   Alcohol use:  Yes    Alcohol/week: 7.0 standard drinks of alcohol    Types: 7 Glasses of wine per week    Comment: 1 glass of wine nightly   Drug use: No      Chart Review Today: I personally reviewed active problem list, medication list, allergies, family history, social history, health maintenance, notes from last encounter, lab results, imaging with the patient/caregiver today.   Review of Systems  Constitutional: Negative.   HENT: Negative.    Eyes: Negative.   Respiratory: Negative.    Cardiovascular: Negative.   Gastrointestinal: Negative.   Endocrine: Negative.   Genitourinary: Negative.   Musculoskeletal: Negative.   Skin: Negative.   Allergic/Immunologic: Negative.   Neurological: Negative.   Hematological: Negative.   Psychiatric/Behavioral: Negative.    All other systems reviewed and are negative.      Objective:   Vitals:   01/19/24 1400  BP: 130/70  Pulse: 89  Resp: 16  Temp: 98.1 F (36.7 C)  SpO2: 99%  Weight: 174 lb (78.9 kg)  Height: 5\' 9"  (1.753 m)    Body mass index is 25.7 kg/m.  Physical Exam Vitals and nursing note reviewed.  Constitutional:      General: She is not in acute distress.    Appearance: Normal appearance. She is well-developed. She is not ill-appearing, toxic-appearing or diaphoretic.  HENT:     Head: Normocephalic and atraumatic.     Right Ear: Tympanic membrane, ear canal and external ear normal. There is no impacted cerumen.     Left Ear: Tympanic membrane, ear canal and external ear normal. There is no impacted cerumen.     Nose: Congestion and rhinorrhea present.     Right Sinus: No maxillary sinus tenderness or frontal sinus tenderness.     Left Sinus: No maxillary sinus tenderness or frontal sinus tenderness.     Mouth/Throat:     Mouth: Mucous membranes are moist.     Pharynx: Oropharynx is clear. No oropharyngeal exudate or posterior oropharyngeal erythema.  Eyes:     General: No scleral icterus.       Right eye: No  discharge.        Left eye: No discharge.     Conjunctiva/sclera: Conjunctivae normal.  Neck:     Trachea: No tracheal deviation.  Cardiovascular:     Rate and Rhythm: Normal rate and regular rhythm.     Pulses: Normal pulses.     Heart sounds: Normal heart sounds.  Pulmonary:     Effort: Pulmonary effort is normal. No respiratory distress.     Breath sounds: Normal breath sounds. No stridor. No wheezing, rhonchi or rales.  Musculoskeletal:        General: Normal range of motion.  Lymphadenopathy:     Cervical: No cervical adenopathy.  Skin:    General: Skin  is warm and dry.     Findings: No rash.  Neurological:     Mental Status: She is alert.     Motor: No abnormal muscle tone.     Coordination: Coordination normal.  Psychiatric:        Behavior: Behavior normal.      Results for orders placed or performed during the hospital encounter of 02/24/23  Surgical pathology   Collection Time: 02/24/23  8:47 AM  Result Value Ref Range   SURGICAL PATHOLOGY      SURGICAL PATHOLOGY CASE: [id number] PATIENT: Jabier Mutton Hosea Surgical Pathology Report     Specimen Submitted: A. Colon polyp x2, asc; c snare(1) cbx(1)  Clinical History: History of colon polyps Z86.010.  Diverticulosis, colon polyps    DIAGNOSIS: A. COLON POLYPS X 2, ASCENDING; COLD SNARE AND COLD BIOPSY: - FRAGMENTS (X 2) OF TUBULAR ADENOMAS. - NEGATIVE FOR HIGH-GRADE DYSPLASIA AND MALIGNANCY.  GROSS DESCRIPTION: A. Labeled: Cold snare polyp x 1 and cbx polyp x 1 ascending colon Received: Formalin Collection time: 8:47 AM on 02/24/2023 Placed into formalin time: 8:47 AM on 02/24/2023 Tissue fragment(s): Multiple Size: Aggregate, 0.6 x 0.3 x 0.1 cm Description: Received are tan to pink soft tissue fragments and intestinal debris.  The ratio soft tissue to intestinal debris is 90: 10. Entirely submitted in 1 cassette.  CM 02/24/2023  Final Diagnosis performed by Katherine Mantle, MD.   Electronically  signed 02/25/2023 8:35:38AM The electronic signature  indicates that the named Attending Pathologist has evaluated the specimen Technical component performed at [facility], [address] [place], [place] [address] Lab: [phone] Dir: Jolene Schimke, MD, MMM  Professional component performed at [facility], [facility], [address] [place], [place], [place] [address] Lab: [phone] Dir: Beryle Quant, MD        Assessment & Plan:   1. Rhinosinusitis (Primary) Hx of allergies (see below) and recent viral illness with lingering sx for 4 weeks She is well appearing, some congestion and discharge on exam No sinus TTP or fever Recommend she continue supportive and symptomatic measures and restart allergy meds regularly  - fluticasone (FLONASE) 50 MCG/ACT nasal spray; Place 2 sprays into both nostrils daily as needed for allergies.  Dispense: 48 g; Refill: 3 - loratadine (CLARITIN) 10 MG tablet; Take 1 tablet (10 mg total) by mouth at bedtime.  Dispense: 30 tablet; Refill: 11  2. Upper respiratory tract infection, unspecified type CXR if sx do not improve in the next 1-2 weeks - benzonatate (TESSALON) 100 MG capsule; Take 1-2 capsules (100-200 mg total) by mouth 3 (three) times daily as needed for cough.  Dispense: 30 capsule; Refill: 0 - DG Chest 2 View  3. Seasonal allergic rhinitis due to pollen Causing most of her sx seems to be drainage and congestion Restart allergy meds If postnasal drip and coughing does not improve or if it worsens she was encouraged to get CXR done and f/up visit - fluticasone (FLONASE) 50 MCG/ACT nasal spray; Place 2 sprays into both nostrils daily as needed for allergies.  Dispense: 48 g; Refill: 3 - loratadine (CLARITIN) 10 MG tablet; Take 1 tablet (10 mg total) by mouth at bedtime.  Dispense: 30 tablet; Refill: 11 - DG Chest 2 View  4. Subacute cough Lungs CTA A&P without pain, fever, or diaphoresis Likely coughing from postnasal drip No  wheeze or SOB Will continue to try conservative management CXR and f/up appt recommended if not improving  - loratadine (CLARITIN) 10 MG tablet; Take 1  tablet (10 mg total) by mouth at bedtime.  Dispense: 30 tablet; Refill: 11 - DG Chest [address]       Danelle Berry, [place] 01/19/24 2:24 PM

## 2024-03-02 ENCOUNTER — Other Ambulatory Visit: Payer: Self-pay | Admitting: Family Medicine

## 2024-03-02 DIAGNOSIS — F418 Other specified anxiety disorders: Secondary | ICD-10-CM

## 2024-04-06 ENCOUNTER — Encounter: Payer: Self-pay | Admitting: Family Medicine

## 2024-04-06 ENCOUNTER — Ambulatory Visit (INDEPENDENT_AMBULATORY_CARE_PROVIDER_SITE_OTHER): Admitting: Family Medicine

## 2024-04-06 VITALS — BP 140/63 | HR 70 | Wt 173.3 lb

## 2024-04-06 DIAGNOSIS — M4003 Postural kyphosis, cervicothoracic region: Secondary | ICD-10-CM | POA: Diagnosis not present

## 2024-04-06 DIAGNOSIS — Z6379 Other stressful life events affecting family and household: Secondary | ICD-10-CM | POA: Insufficient documentation
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
## 2024-04-06 NOTE — Assessment & Plan Note (Signed)
 Rita Cruz has developed postural kyphosis over time which is causing her some mild pain. She also is concerned with the appearance of it. She was reassured today that this is not something malignant or acutely concerning. -Referral to physical therapy -voltaren gel rx sent in

## 2024-05-04 ENCOUNTER — Other Ambulatory Visit: Payer: Self-pay | Admitting: Family Medicine

## 2024-05-04 DIAGNOSIS — F418 Other specified anxiety disorders: Secondary | ICD-10-CM

## 2024-05-10 ENCOUNTER — Other Ambulatory Visit: Payer: Self-pay | Admitting: Family Medicine

## 2024-05-12 ENCOUNTER — Other Ambulatory Visit: Payer: Self-pay

## 2024-05-12 NOTE — Telephone Encounter (Signed)
 Requested medications are due for refill today.  yes  Requested medications are on the active medications list.  yes  Last refill. 08/11/2023 #135 2 rf  Future visit scheduled.   yes  Notes to clinic.  Labs are expired.    Requested Prescriptions  Pending Prescriptions Disp Refills   sertraline  (ZOLOFT ) 100 MG tablet [Pharmacy Med Name: SERTRALINE  HCL 100 MG TAB] 135 tablet 2    Sig: TAKE 1 1/2 TABLETS EVERY DAY     Psychiatry:  Antidepressants - SSRI - sertraline  Failed - 05/12/2024  1:28 PM      Failed - AST in normal range and within 360 days    AST  Date Value Ref Range Status  09/28/2022 13 0 - 40 IU/L Final         Failed - ALT in normal range and within 360 days    ALT  Date Value Ref Range Status  09/28/2022 10 0 - 32 IU/L Final         Passed - Completed PHQ-2 or PHQ-9 in the last 360 days      Passed - Valid encounter within last 6 months    Recent Outpatient Visits           1 month 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   [facility], Stan Eans, MD   3 months ago Rhinosinusitis   [facility] Adeline Hone, [place]       Future Appointments             In 3 weeks Bacigalupo, Stan Eans, MD [facility]

## 2024-05-15 MED ORDER — SERTRALINE HCL 100 MG PO TABS: 100.0000 mg | ORAL_TABLET | Freq: Every day | ORAL | 2 refills | Status: DC

## 2024-05-15 NOTE — Telephone Encounter (Signed)
 FYI - Zoloft  can be filled by protocol (for future reference)

## 2024-06-08 ENCOUNTER — Ambulatory Visit
Admission: RE | Admit: 2024-06-08 | Discharge: 2024-06-08 | Disposition: A | Discharge status: Home / Self Care | Attending: Family Medicine | Admitting: Family Medicine

## 2024-06-08 ENCOUNTER — Encounter: Payer: Self-pay | Admitting: Family Medicine

## 2024-06-08 ENCOUNTER — Ambulatory Visit
Admission: RE | Admit: 2024-06-08 | Discharge: 2024-06-08 | Disposition: A | Discharge status: Home / Self Care | Source: Ambulatory Visit | Attending: Family Medicine

## 2024-06-08 ENCOUNTER — Ambulatory Visit (INDEPENDENT_AMBULATORY_CARE_PROVIDER_SITE_OTHER): Admitting: Family Medicine

## 2024-06-08 VITALS — BP 117/73 | HR 78 | Temp 98.0°F | Resp 16 | Ht 69.0 in | Wt 171.5 lb

## 2024-06-08 DIAGNOSIS — M8589 Other specified disorders of bone density and structure, multiple sites: Secondary | ICD-10-CM | POA: Diagnosis not present

## 2024-06-08 DIAGNOSIS — R630 Anorexia: Secondary | ICD-10-CM | POA: Diagnosis not present

## 2024-06-08 DIAGNOSIS — M546 Pain in thoracic spine: Secondary | ICD-10-CM | POA: Insufficient documentation

## 2024-06-08 DIAGNOSIS — R159 Full incontinence of feces: Secondary | ICD-10-CM

## 2024-06-08 DIAGNOSIS — M5134 Other intervertebral disc degeneration, thoracic region: Secondary | ICD-10-CM | POA: Diagnosis not present

## 2024-06-08 DIAGNOSIS — F418 Other specified anxiety disorders: Secondary | ICD-10-CM | POA: Diagnosis not present

## 2024-06-08 DIAGNOSIS — N3946 Mixed incontinence: Secondary | ICD-10-CM

## 2024-06-08 DIAGNOSIS — H6991 Unspecified Eustachian tube disorder, right ear: Secondary | ICD-10-CM

## 2024-06-08 DIAGNOSIS — Z0001 Encounter for general adult medical examination with abnormal findings: Secondary | ICD-10-CM

## 2024-06-08 DIAGNOSIS — R0609 Other forms of dyspnea: Secondary | ICD-10-CM | POA: Diagnosis not present

## 2024-06-08 DIAGNOSIS — I10 Essential (primary) hypertension: Secondary | ICD-10-CM | POA: Diagnosis not present

## 2024-06-08 DIAGNOSIS — R152 Fecal urgency: Secondary | ICD-10-CM

## 2024-06-08 DIAGNOSIS — N8110 Cystocele, unspecified: Secondary | ICD-10-CM | POA: Diagnosis not present

## 2024-06-08 DIAGNOSIS — M7061 Trochanteric bursitis, right hip: Secondary | ICD-10-CM

## 2024-06-08 DIAGNOSIS — M4804 Spinal stenosis, thoracic region: Secondary | ICD-10-CM | POA: Diagnosis not present

## 2024-06-08 DIAGNOSIS — E7800 Pure hypercholesterolemia, unspecified: Secondary | ICD-10-CM | POA: Diagnosis not present

## 2024-06-08 DIAGNOSIS — Z981 Arthrodesis status: Secondary | ICD-10-CM | POA: Diagnosis not present

## 2024-06-08 DIAGNOSIS — Z0000 Encounter for general adult medical examination without abnormal findings: Secondary | ICD-10-CM

## 2024-06-08 NOTE — Progress Notes (Signed)
 Complete physical exam   Patient: Rita Cruz   DOB: 1941/09/08   83 y.o. Female  MRN: [id number] Visit Date: 06/08/2024  Today's healthcare provider: Jon Eva, MD   Chief Complaint  Patient presents with   Annual Exam    Patient here for physical. She had AWV done 01/18/24. Patient reports feeling well today. She is exercising. She is sleeping well.   Subjective    Rita Cruz is a 83 y.o. female who presents today for a complete physical exam.    Discussed the use of AI scribe software for clinical note transcription with the patient, who gave verbal consent to proceed.  History of Present Illness   Rita Cruz is an 83 year old female who presents for a wellness physical exam.  She experiences lower left abdominal pain with spasms and constipation, related to diverticulitis, which has improved. She is concerned due to having only half of her colon. Fecal and urinary incontinence have worsened over the past year and a half. She experiences urgency and episodes of liquid stool. She feels her bladder is prolapsed. Shortness of breath occurs when rushing, associated with anxiety. She has a family history of heart issues. Her appetite has decreased since her husband's illness. She reports lower back soreness, related to diverticulitis, and soreness across her back when standing for long periods. 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 She uses Flonase  daily.        Last depression screening scores    06/08/2024   10:44 AM 04/06/2024    2:38 PM 01/18/2024    4:12 PM  PHQ 2/9 Scores  PHQ - 2 Score 1 1 4   PHQ- 9 Score  5 8   Last fall risk screening    06/08/2024   10:43 AM  Fall Risk   Falls in the past year? 0  Number falls in past yr: 0  Injury with Fall? 0  Risk for fall due to : No Fall Risks        Medications: Outpatient Medications Prior to Visit  Medication Sig    acetaminophen  (TYLENOL ) 325 MG tablet Take 650 mg by mouth every 6 (six) hours as needed for moderate pain.   ALPRAZolam  (XANAX ) 0.5 MG tablet TAKE 1 TABLET BY MOUTH EVERY 12 HOURS **MUST LAST 30 DAYS**   aspirin  81 MG EC tablet Take 81 mg by mouth at bedtime.    atorvastatin  (LIPITOR) 10 MG tablet TAKE ONE TABLET BY MOUTH ONCE DAILY   fluticasone  (FLONASE ) 50 MCG/ACT nasal spray Place 2 sprays into both nostrils daily as needed for allergies.   GENTEAL TEARS 0.1-0.3 % SOLN as needed.    loratadine  (CLARITIN ) 10 MG tablet Take 1 tablet (10 mg total) by mouth at bedtime.   losartan  (COZAAR ) 100 MG tablet TAKE ONE TABLET EVERY DAY   omeprazole  (PRILOSEC) 20 MG capsule TAKE 1 CAPSULE BY MOUTH ONCE DAILY   sertraline  (ZOLOFT ) 100 MG tablet Take 1 tablet (100 mg total) by mouth daily.   benzonatate  (TESSALON ) 100 MG capsule Take 1-2 capsules (100-200 mg total) by mouth 3 (three) times daily as needed for cough. (Patient not taking: Reported on 04/06/2024)   No facility-administered medications prior to visit.    Review of Systems    Objective    BP 117/73 (BP Location: Left Arm, Patient Position: Sitting, Cuff Size: Normal)   Pulse 78   Temp 98 F (36.7 C) (  Oral)   Resp 16   Ht 5' 9 (1.753 m)   Wt 171 lb 8 oz (77.8 kg)   SpO2 98%   BMI 25.33 kg/m    Physical Exam Vitals reviewed.  Constitutional:      General: She is not in acute distress.    Appearance: Normal appearance. She is well-developed. She is not diaphoretic.  HENT:     Head: Normocephalic and atraumatic.     Right Ear: Ear canal and external ear normal.     Left Ear: Ear canal and external ear normal.     Nose: Nose normal.     Mouth/Throat:     Mouth: Mucous membranes are moist.     Pharynx: Oropharynx is clear. No oropharyngeal exudate.  Eyes:     General: No scleral icterus.    Conjunctiva/sclera: Conjunctivae normal.     Pupils: Pupils are equal, round, and reactive to light.  Neck:     Thyroid : No  thyromegaly.  Cardiovascular:     Rate and Rhythm: Normal rate and regular rhythm.     Heart sounds: Normal heart sounds. No murmur heard. Pulmonary:     Effort: Pulmonary effort is normal. No respiratory distress.     Breath sounds: Normal breath sounds. No wheezing or rales.  Abdominal:     General: There is no distension.     Palpations: Abdomen is soft.     Tenderness: There is no abdominal tenderness.  Musculoskeletal:        General: No deformity.     Cervical back: Neck supple.     Right lower leg: No edema.     Left lower leg: No edema.     Comments: Point TTP over T spine midline vertebra around T8  Lymphadenopathy:     Cervical: No cervical adenopathy.  Skin:    General: Skin is warm and dry.  Neurological:     Mental Status: She is alert and oriented to person, place, and time. Mental status is at baseline.  Psychiatric:        Mood and Affect: Mood normal.        Behavior: Behavior normal.        Thought Content: Thought content normal.      No results found for any visits on 06/08/24.  Assessment & Plan    Routine Health Maintenance and Physical Exam  Exercise Activities and Dietary recommendations  Goals       I need help managing my stress (pt-stated)      Current Barriers:  Acute Mental Health needs related to adjustment to spouse's medical condition and family conflicts  Mental Health Concerns  Suicidal Ideation/Homicidal Ideation: No  Clinical Social Work Goal(s):  Over the next 90 days, patient will work with SW bi-weekly by telephone or in person to reduce or manage symptoms related to stress management   Interventions: Status of patient's stress level discussed as well as possible triggers Confirmed that patient was doing much better due to son and daughter's surgery being a success as well as the delivery of her great grand daughter  Discussed current coping strategies used, (crafting, prayer, reaching out to friends)  exploring alternative  perspectives Discussed plan to go the beach for the next month with spouse (11/30/19-12/16/19) Positive Reinforcement provided in regards to the development of a self care plan including re-arranging medical appointments in-order to extend time at the beach Discussed plans with patient for ongoing care management follow up and provided patient with direct  contact information for care management team if needed in the future Emotional/Supportive Counseling provided related to family stress experienced allowing her to vent her frustrations and celebrate her sucessess  Patient Self Care Activities:  Performs ADL's independently Performs IADL's independently Ability for insight  Patient Coping Strengths:  Supportive Relationships [employer] Able to Communicate Effectively  Patient Self Care Deficits:  Difficulty verbalizing plan for self care  Please see past updates related to this goal by clicking on the Past Updates button in the selected goal        Cut out extra servings      DIET - DECREASE SODA OR JUICE INTAKE      Recommend to cut back to 1 soda a day and increase water intake to 4-6 8 oz glasses a day.       DIET - EAT MORE FRUITS AND VEGETABLES      DIET - INCREASE WATER INTAKE      Recommend increasing water intake 6-8 glasses of water a day.         Immunization History  Administered Date(s) Administered   Fluad Quad(high Dose 65+) 07/21/2019, 08/08/2020, 09/02/2021, 08/25/2022   Influenza, High Dose Seasonal PF 08/25/2017, 08/13/2018   Influenza-Unspecified 08/23/2023   PFIZER(Purple Top)SARS-COV-2 Vaccination 01/08/2020, 02/06/2020, 08/27/2020   Pneumococcal Conjugate-13 05/17/2014   Pneumococcal Polysaccharide-23 04/11/2012   Td 04/08/2010   Tdap 04/11/2012   Zoster Recombinant(Shingrix) 06/14/2019, 09/07/2019   Zoster, Live 02/04/2009    Health Maintenance  Topic Date Due   COVID-19 Vaccine (4 - 2024-25 season) 07/25/2023   DEXA SCAN   04/26/2024   DTaP/Tdap/Td (3 - Td or Tdap) 01/18/2025 (Originally 04/11/2022)   INFLUENZA VACCINE  06/23/2024   Medicare Annual Wellness (AWV)  01/17/2025   Pneumococcal Vaccine: 50+ Years  Completed   Zoster Vaccines- Shingrix  Completed   Hepatitis B Vaccines  Aged Out   HPV VACCINES  Aged Out   Meningococcal B Vaccine  Aged Out   Colonoscopy  Discontinued    Discussed health benefits of physical activity, and encouraged her to engage in regular exercise appropriate for her age and condition.  Problem List Items Addressed This Visit       Cardiovascular and Mediastinum   Essential (primary) hypertension     Nervous and Auditory   Eustachian tube dysfunction, right     Musculoskeletal and Integument   Osteopenia of multiple sites   Relevant Orders   DG Bone Density     Genitourinary   Pelvic organ prolapse quantification stage 1 cystocele   Relevant Orders   Ambulatory referral to Urogynecology     Other   Mixed anxiety and depressive disorder   Hypercholesteremia   Relevant Orders   Comprehensive metabolic panel with GFR   Lipid panel   Other Visit Diagnoses       Encounter for annual physical exam    -  Primary     Mixed stress and urge urinary incontinence       Relevant Orders   Ambulatory referral to Urogynecology     Incontinence of feces with fecal urgency       Relevant Orders   Ambulatory referral to Urogynecology     DOE (dyspnea on exertion)       Relevant Orders   Ambulatory referral to Cardiology   CBC w/Diff/Platelet     Decreased appetite       Relevant Orders   Comprehensive metabolic panel with GFR   CBC w/Diff/Platelet   TSH  Thoracic spine pain       Relevant Orders   DG Thoracic Spine 2 View     Trochanteric bursitis of both hips               Recurrent diverticulitis Recurrent episodes of diverticulitis, with the most recent episode starting on Sunday. Symptoms include lower abdominal pain, constipation, and spasms,  primarily on the left side. Symptoms have improved since onset. Likely triggered by dietary choices (peanuts).  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 Previous pessary use was unsuccessful. Noted prolapse of the anterior vaginal wall (cystocele) in 2020. Symptoms include urgency and inability to reach the bathroom in time. - Refer to urogynecology for evaluation and management of pelvic organ prolapse and incontinence  Shortness of breath on exertion, evaluation for cardiac etiology Shortness of breath primarily when rushing or exerting herself, possibly related to anxiety but cardiac etiology needs to be ruled out. Family history of cardiac issues. - Refer to cardiology for evaluation of shortness of breath and potential cardiac issues  Thoracic spine pain, rule out compression fracture Tenderness in the thoracic spine area, concern for possible compression fracture. Pain noted along the vertebrae. - Order x-ray of thoracic spine to rule out compression fracture  Bilateral trochanteric bursitis Soreness and protrusion on the side of the leg, likely due to trochanteric bursitis. Pain is bilateral and related to overuse. 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 Previous ENT evaluation did not confirm eustachian tube issues, but symptoms suggest otherwise. - Continue using Flonase  nasal spray daily - Use Afrin nasal spray twice daily for two days to help open eustachian tube  Muscular occipital headache Headaches at the base of the skull, likely muscular in origin. Relieved by Tylenol . Possibly related to sleeping position. - Continue using Tylenol  as needed for  headache relief  Decreased appetite Decreased appetite noted since husband's illness, possibly related to stress. - Order labs to check for anemia and thyroid  function  Osteopenia Osteopenia with last bone density scan five years ago. Discussed the importance of monitoring bone density due to previous findings. - Order bone density scan       Return in about 6 months (around 12/09/2024) for chronic disease f/u.     Jon Eva, MD  [facility] [phone] (phone) [phone] (fax)  [facility]

## 2024-06-09 LAB — COMPREHENSIVE METABOLIC PANEL WITH GFR
ALT: 10 IU/L (ref 0–32)
AST: 12 IU/L (ref 0–40)
Albumin: 4.3 g/dL (ref 3.7–4.7)
Alkaline Phosphatase: 87 IU/L (ref 44–121)
BUN/Creatinine Ratio: 10 — ABNORMAL LOW (ref 12–28)
BUN: 11 mg/dL (ref 8–27)
Bilirubin Total: 0.7 mg/dL (ref 0.0–1.2)
CO2: 21 mmol/L (ref 20–29)
Calcium: 9.3 mg/dL (ref 8.7–10.3)
Chloride: 104 mmol/L (ref 96–106)
Creatinine, Ser: 1.14 mg/dL — ABNORMAL HIGH (ref 0.57–1.00)
Globulin, Total: 2.3 g/dL (ref 1.5–4.5)
Glucose: 84 mg/dL (ref 70–99)
Potassium: 3.7 mmol/L (ref 3.5–5.2)
Sodium: 143 mmol/L (ref 134–144)
Total Protein: 6.6 g/dL (ref 6.0–8.5)
eGFR: 48 mL/min/1.73 — ABNORMAL LOW (ref >59)

## 2024-06-09 LAB — CBC WITH DIFFERENTIAL/PLATELET
Basophils Absolute: 0 x10E3/uL (ref 0.0–0.2)
Basos: 1 %
EOS (ABSOLUTE): 0.2 x10E3/uL (ref 0.0–0.4)
Eos: 2 %
Hematocrit: 39.2 % (ref 34.0–46.6)
Hemoglobin: 12.6 g/dL (ref 11.1–15.9)
Immature Grans (Abs): 0 x10E3/uL (ref 0.0–0.1)
Immature Granulocytes: 0 %
Lymphocytes Absolute: 1.7 x10E3/uL (ref 0.7–3.1)
Lymphs: 22 %
MCH: 29.9 pg (ref 26.6–33.0)
MCHC: 32.1 g/dL (ref 31.5–35.7)
MCV: 93 fL (ref 79–97)
Monocytes Absolute: 0.5 x10E3/uL (ref 0.1–0.9)
Monocytes: 7 %
Neutrophils Absolute: 5.4 x10E3/uL (ref 1.4–7.0)
Neutrophils: 68 %
Platelets: 217 x10E3/uL (ref 150–450)
RBC: 4.22 x10E6/uL (ref 3.77–5.28)
RDW: 12.6 % (ref 11.7–15.4)
WBC: 7.9 x10E3/uL (ref 3.4–10.8)

## 2024-06-09 LAB — TSH: TSH: 3.05 u[IU]/mL (ref 0.450–4.500)

## 2024-06-09 LAB — LIPID PANEL
Chol/HDL Ratio: 3.8 ratio (ref 0.0–4.4)
Cholesterol, Total: 126 mg/dL (ref 100–199)
HDL: 33 mg/dL — ABNORMAL LOW (ref >39)
LDL Chol Calc (NIH): 63 mg/dL (ref 0–99)
Triglycerides: 181 mg/dL — ABNORMAL HIGH (ref 0–149)
VLDL Cholesterol Cal: 30 mg/dL (ref 5–40)

## 2024-06-12 ENCOUNTER — Ambulatory Visit: Payer: Self-pay | Admitting: Family Medicine

## 2024-06-30 ENCOUNTER — Other Ambulatory Visit: Payer: Self-pay

## 2024-06-30 DIAGNOSIS — R7989 Other specified abnormal findings of blood chemistry: Secondary | ICD-10-CM

## 2024-07-01 LAB — COMPREHENSIVE METABOLIC PANEL WITH GFR
ALT: 10 IU/L (ref 0–32)
AST: 15 IU/L (ref 0–40)
Albumin: 4.2 g/dL (ref 3.7–4.7)
Alkaline Phosphatase: 86 IU/L (ref 44–121)
BUN/Creatinine Ratio: 12 (ref 12–28)
BUN: 14 mg/dL (ref 8–27)
Bilirubin Total: 0.9 mg/dL (ref 0.0–1.2)
CO2: 20 mmol/L (ref 20–29)
Calcium: 9 mg/dL (ref 8.7–10.3)
Chloride: 98 mmol/L (ref 96–106)
Creatinine, Ser: 1.13 mg/dL — ABNORMAL HIGH (ref 0.57–1.00)
Globulin, Total: 2 g/dL (ref 1.5–4.5)
Glucose: 81 mg/dL (ref 70–99)
Potassium: 3.9 mmol/L (ref 3.5–5.2)
Sodium: 134 mmol/L (ref 134–144)
Total Protein: 6.2 g/dL (ref 6.0–8.5)
eGFR: 49 mL/min/1.73 — ABNORMAL LOW (ref >59)

## 2024-07-05 ENCOUNTER — Ambulatory Visit
Admission: RE | Admit: 2024-07-05 | Discharge: 2024-07-05 | Disposition: A | Discharge status: Home / Self Care | Source: Ambulatory Visit | Attending: Family Medicine | Admitting: Family Medicine

## 2024-07-05 DIAGNOSIS — M810 Age-related osteoporosis without current pathological fracture: Secondary | ICD-10-CM | POA: Diagnosis not present

## 2024-07-05 DIAGNOSIS — Z780 Asymptomatic menopausal state: Secondary | ICD-10-CM | POA: Diagnosis not present

## 2024-07-05 DIAGNOSIS — M8589 Other specified disorders of bone density and structure, multiple sites: Secondary | ICD-10-CM | POA: Insufficient documentation

## 2024-07-07 DIAGNOSIS — R7989 Other specified abnormal findings of blood chemistry: Secondary | ICD-10-CM | POA: Diagnosis not present

## 2024-07-07 DIAGNOSIS — U071 COVID-19: Secondary | ICD-10-CM | POA: Diagnosis not present

## 2024-07-07 DIAGNOSIS — R059 Cough, unspecified: Secondary | ICD-10-CM | POA: Diagnosis not present

## 2024-07-07 DIAGNOSIS — J069 Acute upper respiratory infection, unspecified: Secondary | ICD-10-CM | POA: Diagnosis not present

## 2024-07-12 MED ORDER — ALENDRONATE SODIUM 70 MG PO TABS: 70.0000 mg | ORAL_TABLET | ORAL | 5 refills | Status: DC

## 2024-07-12 NOTE — Addendum Note (Signed)
 Addended by: LILIAN SEVERO RAMAN on: 07/12/2024 08:24 AM   Modules accepted: Orders

## 2024-07-28 ENCOUNTER — Telehealth: Payer: Self-pay | Admitting: *Deleted

## 2024-07-28 NOTE — Telephone Encounter (Signed)
 LMOVM to verify card hx.

## 2024-08-01 ENCOUNTER — Ambulatory Visit: Admitting: Cardiovascular Disease

## 2024-08-01 NOTE — Progress Notes (Unsigned)
  Cardiology Office Note   Date:  08/01/2024  ID:  Rita Cruz, DOB [birth year], MRN [id number] PCP: Myrla Jon HERO, MD  [facility] Providers Cardiologist:  None { Click to update primary MD,subspecialty MD or APP then REFRESH:1}    History of Present Illness Rita Cruz is a 83 y.o. female PMH HTN, HLD who presents for further evaluation and management of exertional dyspnea.  ***.  Last LDL 63 05/2024.  Relevant CVD History -None   ROS: Pt denies any chest discomfort, jaw pain, arm pain, palpitations, syncope, presyncope, orthopnea, PND, or LE edema.  Studies Reviewed I have independently reviewed the patient's ECG, ***.  Physical Exam VS:  There were no vitals taken for this visit.       Wt Readings from Last 3 Encounters:  06/08/24 171 lb 8 oz (77.8 kg)  04/06/24 173 lb 4.8 oz (78.6 kg)  01/19/24 174 lb (78.9 kg)    GEN: No acute distress. NECK: No JVD; No carotid bruits. CARDIAC: ***RRR, no murmurs, rubs, gallops. RESPIRATORY:  Clear to auscultation. EXTREMITIES:  Warm and well-perfused. No edema.  ASSESSMENT AND PLAN DOE ***        {Are you ordering a CV Procedure (e.g. stress test, cath, DCCV, TEE, etc)?   Press F2        :[id number]}  Dispo: ***  Signed, Caron Poser, MD

## 2024-08-02 ENCOUNTER — Ambulatory Visit

## 2024-08-02 VITALS — BP 134/66 | HR 70 | Ht 69.0 in | Wt 168.0 lb

## 2024-08-02 DIAGNOSIS — R0600 Dyspnea, unspecified: Secondary | ICD-10-CM

## 2024-08-02 DIAGNOSIS — I700 Atherosclerosis of aorta: Secondary | ICD-10-CM

## 2024-08-02 DIAGNOSIS — E782 Mixed hyperlipidemia: Secondary | ICD-10-CM | POA: Diagnosis not present

## 2024-08-02 DIAGNOSIS — R0609 Other forms of dyspnea: Secondary | ICD-10-CM

## 2024-08-02 DIAGNOSIS — R002 Palpitations: Secondary | ICD-10-CM

## 2024-08-02 NOTE — Patient Instructions (Addendum)
 Medication Instructions:  Your physician recommends that you continue on your current medications as directed. Please refer to the Current Medication list given to you today.  *If you need a refill on your cardiac medications before your next appointment, please call your pharmacy*  Lab Work: No labs ordered today  If you have labs (blood work) drawn today and your tests are completely normal, you will receive your results only by: MyChart Message (if you have MyChart) OR A paper copy in the mail If you have any lab test that is abnormal or we need to change your treatment, we will call you to review the results.  Testing/Procedures:  CARDIAC PET SCAN:    Please report to Radiology at [facility] Main Entrance, medical mall, 30 mins prior to your test.  [address]  [place], [place]  How to Prepare for Your Cardiac PET/CT Stress Test:  Nothing to eat or drink, except water, 3 hours prior to arrival time.  NO caffeine/decaffeinated products, or chocolate 12 hours prior to arrival. (Please note decaffeinated beverages (teas/coffees) still contain caffeine).  If you have caffeine within 12 hours prior, the test will need to be rescheduled.  Medication instructions:    You may take your medications prior to test with sips of water.  NO perfume, cologne or lotion on chest or abdomen area. FEMALES - Please avoid wearing dresses to this appointment.  Total time is 1 to 2 hours; you may want to bring reading material for the waiting time.   In preparation for your appointment, medication and supplies will be purchased.  Appointment availability is limited, so if you need to cancel or reschedule, please call the Radiology Department Scheduler at [phone] 24 hours in advance to avoid a cancellation fee of $100.00  What to Expect When you Arrive:  Once you arrive and check in for your appointment, you will be taken to a preparation room within the  Radiology Department.  A technologist or Nurse will obtain your medical history, verify that you are correctly prepped for the exam, and explain the procedure.  Afterwards, an IV will be started in your arm and electrodes will be placed on your skin for EKG monitoring during the stress portion of the exam. Then you will be escorted to the PET/CT scanner.  There, staff will get you positioned on the scanner and obtain a blood pressure and EKG.  During the exam, you will continue to be connected to the EKG and blood pressure machines.  A small, safe amount of a radioactive tracer will be injected in your IV to obtain a series of pictures of your heart along with an injection of a stress agent.    After your Exam:  It is recommended that you eat a meal and drink a caffeinated beverage to counter act any effects of the stress agent.  Drink plenty of fluids for the remainder of the day and urinate frequently for the first couple of hours after the exam.  Your doctor will inform you of your test results within 7-10 business days.  For more information and frequently asked questions, please visit our website: [email or web address]  For questions about your test or how to prepare for your test, please call: Cardiac Imaging Nurse Navigators Office: [phone]    ECHOCARDIOGRAM  Your physician has requested that you have an echocardiogram. Echocardiography is a painless test that uses sound waves to create images of your heart. It provides your doctor with information about the  size and shape of your heart and how well your heart's chambers and valves are working.   You may receive an ultrasound enhancing agent through an IV if needed to better visualize your heart during the echo. This procedure takes approximately one hour.  There are no restrictions for this procedure.  This will take place at [address] [facility]) [address], [place] [address]  Please note: We ask at  that you not bring children with you during ultrasound (echo/ vascular) testing. Due to room size and safety concerns, children are not allowed in the ultrasound rooms during exams. Our front office staff cannot provide observation of children in our lobby area while testing is being conducted. An adult accompanying a patient to their appointment will only be allowed in the ultrasound room at the discretion of the ultrasound technician under special circumstances. We apologize for any inconvenience.   ZIO HEART MONITOR  ZIO XT- Long Term Monitor Instructions  Your physician has requested you wear a ZIO patch monitor for 14 days.  This is a single patch monitor. [employer] supplies one patch monitor per enrollment. Additional stickers are not available. Please do not apply patch if you will be having a Nuclear Stress Test,  Echocardiogram, Cardiac CT, MRI, or Chest Xray during the period you would be wearing the  monitor. The patch cannot be worn during these tests. You cannot remove and re-apply the  ZIO XT patch monitor.  Your ZIO patch monitor will be mailed 3 day USPS to your address on file. It may take 3-5 days  to receive your monitor after you have been enrolled.  Once you have received your monitor, please review the enclosed instructions. Your monitor  has already been registered assigning a specific monitor serial # to you.  Billing and Patient Assistance Program Information  We have supplied [employer] with any of your insurance information on file for billing purposes. [employer] offers a sliding scale Patient Assistance Program for patients that do not have  insurance, or whose insurance does not completely cover the cost of the ZIO monitor.  You must apply for the Patient Assistance Program to qualify for this discounted rate.  To apply, please call [employer] at [phone], select option 4, select option 2, ask to apply for  Patient Assistance Program. Meredeth will ask your household  income, and how many people  are in your household. They will quote your out-of-pocket cost based on that information.  [employer] will also be able to set up a 28-month, interest-free payment plan if needed.  Applying the monitor   Shave hair from upper left chest.  Hold abrader disc by orange tab. Rub abrader in 40 strokes over the upper left chest as  indicated in your monitor instructions.  Clean area with 4 enclosed alcohol pads. Let dry.  Apply patch as indicated in monitor instructions. Patch will be placed under collarbone on left  side of chest with arrow pointing upward.  Rub patch adhesive wings for 2 minutes. Remove white label marked 1. Remove the white  label marked 2. Rub patch adhesive wings for 2 additional minutes.  While looking in a mirror, press and release button in center of patch. A small green light will  flash 3-4 times. This will be your only indicator that the monitor has been turned on.   After Placing Monitor:  Do not shower for the first 24 hours. You may shower after the first 24 hours.  Press the button if you  feel a symptom. You will hear a small click. Record Date, Time and  Symptom in the Patient Logbook.    AFTER 14 Days:  When you are ready to remove the patch, follow instructions on the last 2 pages of Patient  Logbook. Stick patch monitor onto the last page of Patient Logbook.  Place Patient Logbook in the blue and white box. Use locking tab on box and tape box closed  securely. The blue and white box has prepaid postage on it. Please place it in the mailbox as  soon as possible. Your physician should have your test results approximately 7 days after the monitor has been mailed back to [facility].   In Case of Problems/Questions: Call [facility] Customer Care at [phone] if you have questions regarding  your ZIO XT patch monitor. Call them immediately if you see an orange light blinking on your  monitor.  If your monitor falls  off in less than 4 days, contact our Monitor department at [phone].  If your monitor becomes loose or falls off after 4 days call [employer] at [phone] for  suggestions on securing your monitor   Follow-Up: At [facility], you and your health needs are our priority.  As part of our continuing mission to provide you with exceptional heart care, our providers are all part of one team.  This team includes your primary Cardiologist (physician) and Advanced Practice Providers or APPs (Physician Assistants and Nurse Practitioners) who all work together to provide you with the care you need, when you need it.  Your next appointment:   AS NEEDED (after testing completed)   Provider:   Caron Poser, MD   We recommend signing up for the patient portal called MyChart.  Sign up information is provided on this After Visit Summary.  MyChart is used to connect with patients for Virtual Visits (Telemedicine).  Patients are able to view lab/test results, encounter notes, upcoming appointments, etc.  Non-urgent messages can be sent to your provider as well.   To learn more about what you can do with MyChart, go to [email or web address].

## 2024-08-11 ENCOUNTER — Other Ambulatory Visit: Payer: Self-pay | Admitting: Family Medicine

## 2024-08-11 NOTE — Telephone Encounter (Signed)
 Requested Prescriptions  Pending Prescriptions Disp Refills   sertraline  (ZOLOFT ) 100 MG tablet [Pharmacy Med Name: SERTRALINE  HCL 100 MG TAB] 135 tablet     Sig: TAKE 1 1/2 TABLETS EVERY DAY     Psychiatry:  Antidepressants - SSRI - sertraline  Passed - 08/11/2024  3:49 PM      Passed - AST in normal range and within 360 days    AST  Date Value Ref Range Status  06/30/2024 15 0 - 40 IU/L Final         Passed - ALT in normal range and within 360 days    ALT  Date Value Ref Range Status  06/30/2024 10 0 - 32 IU/L Final         Passed - Completed PHQ-2 or PHQ-9 in the last 360 days      Passed - Valid encounter within last 6 months    Recent Outpatient Visits           2 months ago Encounter for annual physical exam   [place] [facility] [place], Jon HERO, [place]   4 months 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   [facility] [place], Jon HERO, [place]   6 months ago Rhinosinusitis   [facility] Leavy Mole, PA-C       Future Appointments             In 1 month Marilyn[place], Rosaline SAILOR, MD [facility] Urogynecology at [facility], [facility]             losartan  (COZAAR ) 100 MG tablet [Pharmacy Med Name: LOSARTAN  POTASSIUM 100 MG TAB] 90 tablet 1    Sig: TAKE ONE TABLET EVERY DAY     Cardiovascular:  Angiotensin Receptor Blockers Failed - 08/11/2024  3:49 PM      Failed - Cr in normal range and within 180 days    Creatinine, Ser  Date Value Ref Range Status  06/30/2024 1.13 (H) 0.57 - 1.00 mg/dL Final         Passed - K in normal range and within 180 days    Potassium  Date Value Ref Range Status  06/30/2024 3.9 3.5 - 5.2 mmol/L Final         Passed - Patient is not pregnant      Passed - Last BP in normal range    BP Readings from Last 1 Encounters:  08/02/24 134/66         Passed - Valid encounter within last 6 months    Recent Outpatient Visits           2 months ago  Encounter for annual physical exam   [facility] [place], Jon HERO, MD   4 months 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   [facility] [place], Jon HERO, MD   6 months ago Rhinosinusitis   [facility] Leavy Mole, [place]       Future Appointments             In 1 month Marilynne, Rosaline SAILOR, MD [facility] Urogynecology at [facility], [facility]

## 2024-08-11 NOTE — Telephone Encounter (Signed)
 Requested Prescriptions  Pending Prescriptions Disp Refills   losartan  (COZAAR ) 100 MG tablet [Pharmacy Med Name: LOSARTAN  POTASSIUM 100 MG TAB] 90 tablet 1    Sig: TAKE ONE TABLET EVERY DAY     Cardiovascular:  Angiotensin Receptor Blockers Failed - 08/11/2024  3:51 PM      Failed - Cr in normal range and within 180 days    Creatinine, Ser  Date Value Ref Range Status  06/30/2024 1.13 (H) 0.57 - 1.00 mg/dL Final         Passed - K in normal range and within 180 days    Potassium  Date Value Ref Range Status  06/30/2024 3.9 3.5 - 5.2 mmol/L Final         Passed - Patient is not pregnant      Passed - Last BP in normal range    BP Readings from Last 1 Encounters:  08/02/24 134/66         Passed - Valid encounter within last 6 months    Recent Outpatient Visits           2 months ago Encounter for annual physical exam   [facility] [place], Jon HERO, MD   4 months 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   [facility] [place], Jon HERO, MD   6 months ago Rhinosinusitis   [facility] Leavy Mole, [place]       Future Appointments             In 1 month Marilynne, Rosaline SAILOR, MD [facility] Urogynecology at [facility], [facility]            Refused Prescriptions Disp Refills   sertraline  (ZOLOFT ) 100 MG tablet [Pharmacy Med Name: SERTRALINE  HCL 100 MG TAB] 135 tablet     Sig: TAKE 1 1/2 TABLETS EVERY DAY     Psychiatry:  Antidepressants - SSRI - sertraline  Passed - 08/11/2024  3:51 PM      Passed - AST in normal range and within 360 days    AST  Date Value Ref Range Status  06/30/2024 15 0 - 40 IU/L Final         Passed - ALT in normal range and within 360 days    ALT  Date Value Ref Range Status  06/30/2024 10 0 - 32 IU/L Final         Passed - Completed PHQ-2 or PHQ-9 in the last 360 days      Passed - Valid encounter within last 6 months    Recent Outpatient  Visits           2 months ago Encounter for annual physical exam   [facility] [place], Jon HERO, MD   4 months 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   [facility] [place], Jon HERO, MD   6 months ago Rhinosinusitis   [facility] Leavy Mole, [place]       Future Appointments             In 1 month Marilynne, Rosaline SAILOR, MD [facility] Urogynecology at [facility], [facility]

## 2024-08-14 ENCOUNTER — Other Ambulatory Visit: Payer: Self-pay | Admitting: Family Medicine

## 2024-08-14 NOTE — Telephone Encounter (Signed)
 Copied from CRM [id number]. Topic: Clinical - Medication Refill >> Aug 14, 2024 12:03 PM Tiffany B wrote: Medication: sertraline  (ZOLOFT ) 100 MG tablet (patient has been out since Saturday and would like request expedite)  Has the patient contacted their pharmacy? Yes  (Agent: If yes, when and what did the pharmacy advise?) Contact PCP office. Pharmacy already reached out to PCP     [facility] - [place], [place] - [address] [place] [address] Phone: [phone] Fax: [phone]  Is this the correct pharmacy for this prescription? Yes   Has the prescription been filled recently? Yes  Is the patient out of the medication? Yes  Has the patient been seen for an appointment in the last year OR does the patient have an upcoming appointment? Yes  Can we respond through MyChart? No  Agent: Please be advised that Rx refills may take up to 3 business days. We ask that you follow-up with your pharmacy.

## 2024-08-15 NOTE — Telephone Encounter (Signed)
 Requested medication (s) are due for refill today: Yes  Requested medication (s) are on the active medication list: Yes  Last refill:  05/15/24  Future visit scheduled: No  Notes to clinic:  See request.    Requested Prescriptions  Pending Prescriptions Disp Refills   sertraline  (ZOLOFT ) 100 MG tablet 135 tablet 2    Sig: Take 1 tablet (100 mg total) by mouth daily.     Psychiatry:  Antidepressants - SSRI - sertraline  Passed - 08/15/2024  1:32 PM      Passed - AST in normal range and within 360 days    AST  Date Value Ref Range Status  06/30/2024 15 0 - 40 IU/L Final         Passed - ALT in normal range and within 360 days    ALT  Date Value Ref Range Status  06/30/2024 10 0 - 32 IU/L Final         Passed - Completed PHQ-2 or PHQ-9 in the last 360 days      Passed - Valid encounter within last 6 months    Recent Outpatient Visits           2 months ago Encounter for annual physical exam   [facility] [place], Jon HERO, MD   4 months 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   [facility] [place], Jon HERO, MD   6 months ago Rhinosinusitis   [facility] Leavy Mole, [place]       Future Appointments             In 1 month Marilynne, Rosaline SAILOR, MD [facility] Urogynecology at [facility], [facility]

## 2024-08-16 ENCOUNTER — Other Ambulatory Visit: Payer: Self-pay | Admitting: Family Medicine

## 2024-08-17 ENCOUNTER — Other Ambulatory Visit: Payer: Self-pay

## 2024-08-17 MED ORDER — LOSARTAN POTASSIUM 100 MG PO TABS: 100.0000 mg | ORAL_TABLET | Freq: Every day | ORAL | 1 refills | Status: AC

## 2024-08-17 MED ORDER — SERTRALINE HCL 100 MG PO TABS: 100.0000 mg | ORAL_TABLET | Freq: Every day | ORAL | 2 refills | Status: DC

## 2024-08-17 NOTE — Telephone Encounter (Unsigned)
 Copied from CRM [id number]. Topic: Clinical - Prescription Issue >> Aug 17, 2024 11:35 AM Wess RAMAN wrote: Reason for CRM: Patient would like to know why her sertraline  (ZOLOFT ) 100 MG tablet is not being filled. She has been waiting all week.  Callback #: [id number]

## 2024-08-18 MED ORDER — SERTRALINE HCL 100 MG PO TABS: 100.0000 mg | ORAL_TABLET | Freq: Every day | ORAL | 3 refills | Status: AC

## 2024-08-23 ENCOUNTER — Encounter (HOSPITAL_COMMUNITY): Payer: Self-pay

## 2024-08-24 ENCOUNTER — Ambulatory Visit
Admission: RE | Admit: 2024-08-24 | Discharge: 2024-08-24 | Disposition: A | Discharge status: Home / Self Care | Source: Ambulatory Visit

## 2024-08-24 DIAGNOSIS — R0609 Other forms of dyspnea: Secondary | ICD-10-CM | POA: Insufficient documentation

## 2024-08-24 MED ORDER — REGADENOSON 0.4 MG/5ML IV SOLN
0.4000 mg | Freq: Once | INTRAVENOUS | Status: AC
  Administered 2024-08-24: 0.4 mg via INTRAVENOUS
  Filled 2024-08-24: qty 5

## 2024-08-24 MED ORDER — RUBIDIUM RB82 GENERATOR (RUBYFILL)
25.0000 | PACK | Freq: Once | INTRAVENOUS | Status: AC
  Administered 2024-08-24: 19.75 via INTRAVENOUS

## 2024-08-24 MED ORDER — RUBIDIUM RB82 GENERATOR (RUBYFILL)
25.0000 | PACK | Freq: Once | INTRAVENOUS | Status: AC
Start: 2024-08-24 — End: 2024-08-24
  Administered 2024-08-24: 19.75 via INTRAVENOUS

## 2024-08-24 MED ORDER — REGADENOSON 0.4 MG/5ML IV SOLN
INTRAVENOUS | Status: AC
  Filled 2024-08-24: qty 5

## 2024-08-25 ENCOUNTER — Ambulatory Visit: Payer: Self-pay

## 2024-08-25 DIAGNOSIS — R002 Palpitations: Secondary | ICD-10-CM | POA: Diagnosis not present

## 2024-08-25 DIAGNOSIS — R0609 Other forms of dyspnea: Secondary | ICD-10-CM | POA: Diagnosis not present

## 2024-08-25 DIAGNOSIS — H2000 Unspecified acute and subacute iridocyclitis: Secondary | ICD-10-CM | POA: Diagnosis not present

## 2024-08-25 DIAGNOSIS — I442 Atrioventricular block, complete: Secondary | ICD-10-CM

## 2024-08-25 LAB — NM PET CT CARDIAC PERFUSION MULTI W/ABSOLUTE BLOODFLOW
LV dias vol: 118 mL (ref 46–106)
LV sys vol: 50 mL (ref 3.8–5.2)
MBFR: 2.23
Nuc Rest EF: 52 %
Nuc Stress EF: 58 %
Peak HR: 83 {beats}/min
Rest HR: 71 {beats}/min
Rest MBF: 0.93 ml/g/min
Rest Nuclear Isotope Dose: 19.8 mCi
SRS: 0
SSS: 0
ST Depression (mm): 0 mm
Stress MBF: 2.07 ml/g/min
Stress Nuclear Isotope Dose: 19.8 mCi
TID: 1.09

## 2024-08-28 ENCOUNTER — Other Ambulatory Visit: Payer: Self-pay | Admitting: Family Medicine

## 2024-08-28 DIAGNOSIS — R0609 Other forms of dyspnea: Secondary | ICD-10-CM

## 2024-08-28 DIAGNOSIS — R002 Palpitations: Secondary | ICD-10-CM | POA: Diagnosis not present

## 2024-08-28 NOTE — Telephone Encounter (Signed)
 Patient is returning call.

## 2024-08-29 NOTE — Progress Notes (Unsigned)
 Electrophysiology Clinic Note    Date:  08/30/2024  Patient ID:  Rita Cruz, Rita Cruz 11-20-1941, MRN [id number] PCP:  Myrla Jon HERO, MD  Cardiologist:  Caron Poser, MD      Discussed the use of AI scribe software for clinical note transcription with the patient, who gave verbal consent to proceed.   Patient Profile    Chief Complaint: Zio monitor results  History of Present Illness: Rita Cruz is a 83 y.o. female with PMH notable for HTN, HLD; seen today for electrophysiology consult of complete heart block as requested by Dr. Poser.   She saw Dr. Poser 08/02/2024 for initial cardiology consult of exertional dyspnea with occasional nocturnal palpitations, and recommended TTE, zio, and PET to further eval.  Two week zio revealed 7 nocturnal CHB, longest of which was 4.2 seconds, none associated with patient triggers. Also showed 21 SVT episodes, also not associated with patient trigger. PET was normal.   On follow-up today, she says that she did feel intermittent palpitations while wearing the monitor but they were very brief so did not put the button. Her palpitations do not interfere with her day-to-day activities. She does not have chest pain, chest pressure, SOB, dizziness, presyncope during palpitation episodes.   She is a night owl staying up late reading or playing on phone, and routine sleeps late in the morning. She does not recall being awake in the early-morning hours while wearing monitor, had no episodes of LH, presyncope.   She does snore loudly and does not wake refreshed in the AM. She does occasionally nod off in the afternoons.     Arrhythmia/Device History No specialty comments available.    ROS:  Please see the history of present illness. All other systems are reviewed and otherwise negative.    Physical Exam    VS:  BP 130/70 (BP Location: Left Arm, Patient Position: Sitting, Cuff Size: Normal)   Pulse 78   Ht 5' 9  (1.753 m)   Wt 166 lb 3.2 oz (75.4 kg)   SpO2 95%   BMI 24.54 kg/m  BMI: Body mass index is 24.54 kg/m.    STOP-Bang Score:  4         Wt Readings from Last 3 Encounters:  08/30/24 166 lb 3.2 oz (75.4 kg)  08/02/24 168 lb (76.2 kg)  06/08/24 171 lb 8 oz (77.8 kg)     GEN- The patient is well appearing, alert and oriented x 3 today.   Lungs- Clear to ausculation bilaterally, normal work of breathing.  Heart- Regular rate and rhythm, no murmurs, rubs or gallops Extremities- No peripheral edema, warm, dry   Studies Reviewed   Previous EP, cardiology notes.    EKG is not ordered. Personal review of EKG from 08/02/2024 shows:  SR at 69bpm        Cardiac Pet CT, 08/24/2024   LV perfusion is normal.   Rest left ventricular function is normal. Rest EF: 52%. Stress left ventricular function is normal. Stress EF: 58%. End diastolic cavity size is normal. End systolic cavity size is normal.   Myocardial blood flow was computed to be 0.35ml/g/min at rest and 2.37ml/g/min at stress. Global myocardial blood flow reserve was 2.23 and was normal.   Coronary calcium  was present on the attenuation correction CT images. Mild coronary calcifications were present. Coronary calcifications were present in the left anterior descending artery and right coronary artery distribution(s).   The study is normal. The study is  low risk.  Long term zio, 08/28/2024 The monitor revealed predominantly sinus rhythm with mean HR of 68 bpm (range 44 - 141).   There were rare PACs and rare supraventricular couplets/triplets.  There were occasional PVCs (1.7%) and rare ventricular couplets/triplets.  There was 1 patient triggers which corresponded to sinus rhythm.  There were significant arrhythmias seen.  There were 7 pauses recorded (longest 4.2 seconds). Tracings showed several non-conducted p-waves consistent with intermittent CHB without escape.  There were 21 episodes of SVT (longest 11.7 seconds, max HR  190bpm).   Assessment and Plan     #) nocturnal CHB #) sleep disordered breathing Rare CHB episodes in the early morning hours. Patient sleeps late in the morning, and does not recall having concerning symptoms during these episodes. They were not associated with symptom activation on zio monitor.  She does not have an indication for PPM at this time Stop bang score = 4, will refer to pulm for OSA evaluation   #) SVT Overall burden is very low with episodes that do not effect her quality of life Will follow TTE results If LVEF is normal, would recommend continuing to monitor for increased burden. Can consider initiating diltiazem  if episodes become more burdensome       Current medicines are reviewed at length with the patient today.   The patient does not have concerns regarding her medicines.  The following changes were made today:  none  Labs/ tests ordered today include:  No orders of the defined types were placed in this encounter.    Disposition: Follow up with EP Team PRN    Signed, Boby Eyer, NP  08/30/24  1:36 PM  Electrophysiology [facility]

## 2024-08-30 ENCOUNTER — Encounter: Payer: Self-pay | Admitting: Cardiology

## 2024-08-30 ENCOUNTER — Ambulatory Visit: Attending: Cardiology | Admitting: Cardiology

## 2024-08-30 VITALS — BP 130/70 | HR 78 | Ht 69.0 in | Wt 166.2 lb

## 2024-08-30 DIAGNOSIS — G4730 Sleep apnea, unspecified: Secondary | ICD-10-CM

## 2024-08-30 DIAGNOSIS — I459 Conduction disorder, unspecified: Secondary | ICD-10-CM | POA: Diagnosis not present

## 2024-08-30 NOTE — Patient Instructions (Signed)
 Medication Instructions:  Your physician recommends that you continue on your current medications as directed. Please refer to the Current Medication list given to you today.   *If you need a refill on your cardiac medications before your next appointment, please call your pharmacy*  Lab Work: No labs ordered today  If you have labs (blood work) drawn today and your tests are completely normal, you will receive your results only by: MyChart Message (if you have MyChart) OR A paper copy in the mail If you have any lab test that is abnormal or we need to change your treatment, we will call you to review the results.  Testing/Procedures: No test ordered today   Follow-Up: At [facility], you and your health needs are our priority.  As part of our continuing mission to provide you with exceptional heart care, our providers are all part of one team.  This team includes your primary Cardiologist (physician) and Advanced Practice Providers or APPs (Physician Assistants and Nurse Practitioners) who all work together to provide you with the care you need, when you need it.  Your next appointment:   As needed with electrophysiology

## 2024-08-31 DIAGNOSIS — H353131 Nonexudative age-related macular degeneration, bilateral, early dry stage: Secondary | ICD-10-CM | POA: Diagnosis not present

## 2024-08-31 DIAGNOSIS — H2000 Unspecified acute and subacute iridocyclitis: Secondary | ICD-10-CM | POA: Diagnosis not present

## 2024-09-21 ENCOUNTER — Ambulatory Visit

## 2024-09-21 DIAGNOSIS — R002 Palpitations: Secondary | ICD-10-CM

## 2024-09-21 DIAGNOSIS — R0609 Other forms of dyspnea: Secondary | ICD-10-CM | POA: Diagnosis not present

## 2024-09-21 LAB — ECHOCARDIOGRAM COMPLETE
AR max vel: 2.07 cm2
AV Area VTI: 2.07 cm2
AV Area mean vel: 1.93 cm2
AV Mean grad: 7 mmHg
AV Peak grad: 13.5 mmHg
Ao pk vel: 1.84 m/s
Area-P 1/2: 3.31 cm2
S' Lateral: 2.71 cm

## 2024-09-22 ENCOUNTER — Encounter: Payer: Self-pay | Admitting: Obstetrics and Gynecology

## 2024-09-22 ENCOUNTER — Ambulatory Visit: Admitting: Obstetrics and Gynecology

## 2024-09-22 VITALS — BP 129/75 | HR 78 | Ht 68.5 in | Wt 165.0 lb

## 2024-09-22 DIAGNOSIS — R159 Full incontinence of feces: Secondary | ICD-10-CM | POA: Diagnosis not present

## 2024-09-22 DIAGNOSIS — N3281 Overactive bladder: Secondary | ICD-10-CM

## 2024-09-22 DIAGNOSIS — N393 Stress incontinence (female) (male): Secondary | ICD-10-CM | POA: Diagnosis not present

## 2024-09-22 MED ORDER — TROSPIUM CHLORIDE ER 60 MG PO CP24: 1.0000 | ORAL_CAPSULE | Freq: Every day | ORAL | 5 refills | Status: DC

## 2024-09-22 NOTE — Assessment & Plan Note (Addendum)
-   We discussed the symptoms of overactive bladder (OAB), which include urinary urgency, urinary frequency, nocturia, with or without urge incontinence.  While we do not know the exact etiology of OAB, several treatment options exist. We discussed management including behavioral therapy (decreasing bladder irritants, urge suppression strategies, timed voids, bladder retraining), physical therapy, medication; for refractory cases posterior tibial nerve stimulation, sacral neuromodulation, and intravesical botulinum toxin injection.  - Prescribed trospium 60mg  ER daily. For anticholinergic medications, we discussed the potential side effects of anticholinergics including dry eyes, dry mouth, constipation, cognitive impairment and urinary retention. - Recommended drinking more water and eliminating intake of coffee and soda

## 2024-09-22 NOTE — Progress Notes (Signed)
 New Patient Evaluation and Consultation  Referring Provider: Myrla Jon HERO, MD PCP: Myrla Jon HERO, MD Date of Service: 09/22/2024  SUBJECTIVE Chief Complaint: New Patient (Initial Visit) Rita Cruz is a 83 y.o. female is here for prolapse)  History of Present Illness: Rita Cruz is a 83 y.o. White or Caucasian female seen in consultation at the request of Dr Myrla for evaluation of incontinence and prolapse.     Urinary Symptoms: Leaks urine with cough/ sneeze, laughing, going from sitting to standing, with a full bladder, with movement to the bathroom, with urgency, and while asleep Leaks 2-3 time(s) per day. Most often with urgency. Usually has wet pullup at night. SUI more when she is sick. Pad use: 2- 3 adult diapers per day.  Does not wear pads during the day Patient is bothered by UI symptoms. Has done pelvic PT with biofeedback- but symptoms returned.  Prescribed ditropan  previously but does not recall taking it.   Day time voids 3-4.  Nocturia: 1-2 times per night to void. Voiding dysfunction:  does not empty bladder well.  Patient does not use a catheter to empty bladder.  When urinating, patient feels she has no difficulties Drinks: 2 oz bottles water, a few glasses Dr Nunzio, 2 large glasses diet green tea per day, occasional glass of wine/ cocktail at night. Typically drinks Dr pepper with taking pills before bed.   UTIs: 0 UTI's in the last year.   Denies history of blood in urine and kidney or bladder stones   Pelvic Organ Prolapse Symptoms:                  Patient Admits to a feeling of a bulge the vaginal area. It has been present for several years.  Patient Denies seeing a bulge.  This bulge is not bothersome. Has used a pessary in the past.   Bowel Symptom: Bowel movements: 1-2 time(s) per day Stool consistency: loose Straining: no.  Splinting: no.  Incomplete evacuation: no.  Patient Admits to accidental bowel  leakage / fecal incontinence  Occurs: frequently, not happening every day  Consistency with leakage: liquid Bowel regimen: diet  HM Colonoscopy          Completed or No Longer Recommended     Colonoscopy  Discontinued      Frequency changed to Never automatically (Topic No Longer Applies)   02/24/2023  COLONOSCOPY   Only the first 1 history entries have been loaded, but more history exists.                Sexual Function Sexually active: no.    Pelvic Pain Denies pelvic pain    Past Medical History:  Past Medical History:  Diagnosis Date   Adenomatous polyps    Allergy    Forever, it seems   Anxiety    Arthritis    neck   Bronchitis 02/04/2018   Capsulitis of toe of right foot    Cataract    Surgery   Chest pain    Adenosine Myoview in 2006 done for atypical chest pain, showed no eviden e for ischemia or infarct   Colon polyps    Depression    GERD (gastroesophageal reflux disease)    History of colonic polyps    HTN (hypertension)    Hyperlipidemia    Postoperative nausea and vomiting 01/06/2022   PUD (peptic ulcer disease)    with hx of H. Pylori     Past Surgical History:  Past Surgical History:  Procedure Laterality Date   ABDOMINAL HYSTERECTOMY     BREAST BIOPSY Right 2011   benign mass done in Dr. Gilberto office   BREAST BIOPSY Right 06/29/2015   top hat marker. PROLIFERATIVE FIBROCYSTIC CHANGE WITH ASSOCIATED    CERVICAL DISCECTOMY     CHOLECYSTECTOMY     COLON SURGERY  2020   Colon resection   COLONOSCOPY WITH PROPOFOL  N/A 10/06/2016   Procedure: COLONOSCOPY WITH PROPOFOL ;  Surgeon: Gladis RAYMOND Mariner, MD;  Location: [facility] ENDOSCOPY;  Service: Endoscopy;  Laterality: N/A;   COLONOSCOPY WITH PROPOFOL  N/A 12/30/2018   Procedure: COLONOSCOPY WITH PROPOFOL ;  Surgeon: Mariner Gladis RAYMOND, MD;  Location: [facility] ENDOSCOPY;  Service: Endoscopy;  Laterality: N/A;   COLONOSCOPY WITH PROPOFOL  N/A 01/30/2020   Procedure: COLONOSCOPY WITH  PROPOFOL ;  Surgeon: Jinny Carmine, MD;  Location: [facility] ENDOSCOPY;  Service: Endoscopy;  Laterality: N/A;   COLONOSCOPY WITH PROPOFOL  N/A 02/24/2023   Procedure: COLONOSCOPY WITH PROPOFOL ;  Surgeon: Tye Millet, DO;  Location: [facility] ENDOSCOPY;  Service: General;  Laterality: N/A;   EYE SURGERY     FRACTURE SURGERY Right 05/21/2020   [place] the [place]. Proximal humerus fracture   hysterectomy-unspecified area     LAPAROSCOPIC RIGHT COLECTOMY Right 01/23/2019   Procedure: LAPAROSCOPIC RIGHT COLECTOMY;  Surgeon: Dessa Reyes ORN, MD;  Location: [facility] ORS;  Service: General;  Laterality: Right;   SPINE SURGERY     Cervical disc   TUBAL LIGATION     Clips years sgo     Past OB/GYN History: OB History  Gravida Para Term Preterm AB Living  2 2 2   2   SAB IAB Ectopic Multiple Live Births      2    # Outcome Date GA Lbr Len/2nd Weight Sex Type Anes PTL Lv  2 Term      Vag-Spont     1 Term      Vag-Spont       Obstetric Comments        S/p hysterectomy  Medications: Patient has a current medication list which includes the following prescription(s): acetaminophen , alendronate , alprazolam , vitamin c, aspirin  ec, atorvastatin , cholecalciferol, co-enzyme q-10, fluticasone , genteal tears, loratadine , losartan , omeprazole , sertraline , trospium chloride, and zinc gluconate.   Allergies: Patient is allergic to morphine  and codeine, percocet  [oxycodone -acetaminophen ], levaquin [levofloxacin in d5w], lisinopril , meloxicam , oxycodone , and penicillins.   Social History:  Social History   Tobacco Use   Smoking status: Never   Smokeless tobacco: Never   Tobacco comments:    does not smoke  Vaping Use   Vaping status: Never Used  Substance Use Topics   Alcohol use: Yes    Alcohol/week: 7.0 standard drinks of alcohol    Types: 7 Glasses of wine per week    Comment: 1 glass of wine nightly   Drug use: No    Relationship status: widowed Patient lives with her adult son.   Patient  is not employed. Regular exercise: Yes: walking History of abuse: No  Family History:   Family History  Problem Relation Age of Onset   COPD Mother    Colon cancer Mother 65   Heart attack Mother    Ulcers Mother    Osteoporosis Mother    Macular degeneration Mother    Kidney failure Father    Heart failure Father    Diabetes Father    Heart disease Brother    Hypertension Brother    Heart disease Brother    Early death Brother  Irritable bowel syndrome Daughter    Colon polyps Daughter    Kidney Stones Son    Bipolar disorder Son    Melanoma Son        stage IV   Hypertension Son    Kidney cancer Other    Breast cancer Neg Hx      Review of Systems: Review of Systems  Constitutional:  Negative for fever, malaise/fatigue and weight loss.  Respiratory:  Positive for shortness of breath and wheezing. Negative for cough.   Cardiovascular:  Negative for chest pain, palpitations and leg swelling.  Gastrointestinal:  Negative for abdominal pain and blood in stool.  Genitourinary:  Positive for dysuria.  Musculoskeletal:  Negative for myalgias.  Skin:  Negative for rash.  Neurological:  Negative for dizziness and headaches.  Endo/Heme/Allergies:  Bruises/bleeds easily.  Psychiatric/Behavioral:  Positive for depression. The patient is nervous/anxious.      OBJECTIVE Physical Exam: Vitals:   09/22/24 1319  BP: 129/75  Pulse: 78  Weight: 165 lb (74.8 kg)  Height: 5' 8.5 (1.74 m)    Physical Exam Vitals reviewed. Exam conducted with a chaperone present.  Constitutional:      General: She is not in acute distress. Pulmonary:     Effort: Pulmonary effort is normal.  Abdominal:     General: There is no distension.     Palpations: Abdomen is soft.     Tenderness: There is no abdominal tenderness. There is no rebound.  Musculoskeletal:        General: No swelling. Normal range of motion.  Skin:    General: Skin is warm and dry.     Findings: No rash.   Neurological:     Mental Status: She is alert and oriented to person, place, and time.  Psychiatric:        Mood and Affect: Mood normal.        Behavior: Behavior normal.      GU / Detailed Urogynecologic Evaluation:  Pelvic Exam: Normal external female genitalia; Bartholin's and Skene's glands normal in appearance; urethral meatus normal in appearance, no urethral masses or discharge.   CST: positive  s/p hysterectomy: Speculum exam reveals normal vaginal mucosa with  atrophy and normal vaginal cuff.  Adnexa no mass, fullness, tenderness.    Pelvic floor strength I/V, puborectalis I/V external anal sphincter II/V  Pelvic floor musculature: Right levator non-tender, Right obturator non-tender, Left levator non-tender, Left obturator non-tender  POP-Q:   POP-Q  -2                                            Aa   -2                                           Ba  -6.5                                              C   3  Gh  5                                            Pb  7                                            tvl   -3                                            Ap  -3                                            Bp                                                 D      Rectal Exam:  Normal sphincter tone, no distal rectocele, enterocoele not present, no rectal masses, noted dyssynergia when asking the patient to bear down.  Post-Void Residual (PVR) by Bladder Scan: In order to evaluate bladder emptying, we discussed obtaining a postvoid residual and patient agreed to this procedure.  Procedure: The ultrasound unit was placed on the patient's abdomen in the suprapubic region after the patient had voided.    Post Void Residual - 09/22/24 1325       Post Void Residual   Post Void Residual 8 mL           Laboratory Results: Lab Results  Component Value Date   COLORU yellow 01/16/2021   CLARITYU clear  01/16/2021   GLUCOSEUR Negative 01/16/2021   BILIRUBINUR Negative 01/16/2021   KETONESU Negative 01/16/2021   SPECGRAV 1.015 01/16/2021   RBCUR Negative 01/16/2021   PHUR 6.0 01/16/2021   PROTEINUR Negative 01/16/2021   UROBILINOGEN 0.2 01/16/2021   LEUKOCYTESUR Negative 01/16/2021    Lab Results  Component Value Date   CREATININE 1.13 (H) 06/30/2024   CREATININE 1.14 (H) 06/08/2024   CREATININE 1.00 09/28/2022    Lab Results  Component Value Date   HGBA1C 5.5 09/28/2022    Lab Results  Component Value Date   HGB 12.6 06/08/2024     ASSESSMENT AND PLAN Ms. Longley is a 83 y.o. with:  1. Overactive bladder   2. Incontinence of feces, unspecified fecal incontinence type   3. SUI (stress urinary incontinence, female)     Overactive bladder Assessment & Plan: - We discussed the symptoms of overactive bladder (OAB), which include urinary urgency, urinary frequency, nocturia, with or without urge incontinence.  While we do not know the exact etiology of OAB, several treatment options exist. We discussed management including behavioral therapy (decreasing bladder irritants, urge suppression strategies, timed voids, bladder retraining), physical therapy, medication; for refractory cases posterior tibial nerve stimulation, sacral neuromodulation, and intravesical botulinum toxin injection.  - Prescribed trospium 60mg  ER daily. For anticholinergic medications, we discussed the potential side effects of anticholinergics including dry eyes, dry mouth, constipation,  cognitive impairment and urinary retention. - Recommended drinking more water and eliminating intake of coffee and soda   Orders: -     Trospium Chloride ER; Take 1 capsule (60 mg total) by mouth daily.  Dispense: 30 capsule; Refill: 5  Incontinence of feces, unspecified fecal incontinence type Assessment & Plan: - Treatment options include anti-diarrhea medication (loperamide/ Imodium OTC or prescription lomotil),  fiber supplements, physical therapy, and possible sacral neuromodulation or surgery.   - She will start daily metamucil for stool bulking   SUI (stress urinary incontinence, female) Assessment & Plan: - For treatment of stress urinary incontinence,  non-surgical options include expectant management, weight loss, physical therapy, as well as a pessary.  Surgical options include a midurethral sling, Burch urethropexy, and transurethral injection of a bulking agent. - Leakage demonstrated on exam today. She feels she may want a pessary but wants to see how she responds to OAB treatment first since SUI is less bothersome.    Return 6 weeks   Rita LOISE Caper, MD

## 2024-09-22 NOTE — Addendum Note (Signed)
 Addended by: ARGENTINA CLAP on: 09/22/2024 11:30 AM   Modules accepted: Orders

## 2024-09-22 NOTE — Assessment & Plan Note (Addendum)
-   Treatment options include anti-diarrhea medication (loperamide/ Imodium OTC or prescription lomotil), fiber supplements, physical therapy, and possible sacral neuromodulation or surgery.   - She will start daily metamucil for stool bulking

## 2024-09-22 NOTE — Assessment & Plan Note (Signed)
-   For treatment of stress urinary incontinence,  non-surgical options include expectant management, weight loss, physical therapy, as well as a pessary.  Surgical options include a midurethral sling, Burch urethropexy, and transurethral injection of a bulking agent. - Leakage demonstrated on exam today. She feels she may want a pessary but wants to see how she responds to OAB treatment first since SUI is less bothersome.

## 2024-09-22 NOTE — Patient Instructions (Addendum)
 Today we talked about ways to manage bladder urgency such as altering your diet to avoid irritative beverages and foods (bladder diet) as well as attempting to decrease stress and other exacerbating factors.  Aim to drink 40-60oz water per day. Try to avoid drinking 2-3 hours before bedtime.   The Most Bothersome Foods* The Least Bothersome Foods*  Coffee - Regular & Decaf Tea - caffeinated Carbonated beverages - cola, non-colas, diet & caffeine-free Alcohols - Beer, Red Wine, White Wine, [address] - Grapefruit, Friedens, Orange, [employer] - Cranberry, Grapefruit, Orange, Pineapple Vegetables - Tomato & Tomato Products Flavor Enhancers - Hot peppers, Spicy foods, Chili, Horseradish, Vinegar, Monosodium glutamate (MSG) Artificial Sweeteners - NutraSweet, Sweet 'N Low, Equal (sweetener), Saccharin Ethnic foods - Mexican, Thai, Indian food [employer] - low-fat & whole Fruits - Bananas, Blueberries, Honeydew melon, Pears, Raisins, Watermelon Vegetables - Broccoli, [address] Sprouts, [place], Carrots, Cauliflower, [place], Cucumber, Mushrooms, Peas, Radishes, Squash, Zucchini, White potatoes, Sweet potatoes & yams Poultry - Chicken, Eggs, Turkey, [employer] - Beef, [employer], Lamb Seafood - Shrimp, [place] fish, Salmon Grains - Oat, Rice Snacks - Pretzels, Popcorn  *Mitch ALF et al. Diet and its role in interstitial cystitis/bladder pain syndrome (IC/BPS) and comorbid conditions. [employer]. BJU Int. 2012 [name] 11.   Accidental Bowel Leakage: Our goal is to achieve formed bowel movements daily or every-other-day without leakage.  You may need to try different combinations of the following options to find what works best for you.  Some management options include: Dietary changes (more leafy greens, vegetables and fruits; less processed foods) Fiber supplementation (Metamucil or something with psyllium as active ingredient)- start and take daily Over-the-counter imodium (tablets or  liquid) to help solidify the stool and prevent leakage of stool.   We discussed the symptoms of overactive bladder (OAB), which include urinary urgency, urinary frequency, night-time urination, with or without urge incontinence.  We discussed management including behavioral therapy (decreasing bladder irritants by following a bladder diet, urge suppression strategies, timed voids, bladder retraining), physical therapy, medication; and for refractory cases posterior tibial nerve stimulation, sacral neuromodulation, and intravesical botulinum toxin injection.   For treatment of stress urinary incontinence, which is leakage with physical activity/movement/strainging/coughing, we discussed expectant management versus nonsurgical options versus surgery. Nonsurgical options include weight loss, physical therapy, as well as a pessary.  Surgical options include a midurethral sling, which is a synthetic mesh sling that acts like a hammock under the urethra to prevent leakage of urine, and transurethral injection of a bulking agent.

## 2024-09-25 DIAGNOSIS — H353131 Nonexudative age-related macular degeneration, bilateral, early dry stage: Secondary | ICD-10-CM | POA: Diagnosis not present

## 2024-09-25 DIAGNOSIS — H2000 Unspecified acute and subacute iridocyclitis: Secondary | ICD-10-CM | POA: Diagnosis not present

## 2024-10-05 ENCOUNTER — Ambulatory Visit: Admitting: Internal Medicine

## 2024-10-05 ENCOUNTER — Other Ambulatory Visit: Payer: Self-pay | Admitting: Obstetrics and Gynecology

## 2024-10-05 ENCOUNTER — Encounter: Payer: Self-pay | Admitting: Internal Medicine

## 2024-10-05 ENCOUNTER — Other Ambulatory Visit: Payer: Self-pay

## 2024-10-05 VITALS — BP 110/60 | HR 74 | Temp 98.2°F | Ht 69.0 in | Wt 165.4 lb

## 2024-10-05 DIAGNOSIS — G4733 Obstructive sleep apnea (adult) (pediatric): Secondary | ICD-10-CM

## 2024-10-05 DIAGNOSIS — N3281 Overactive bladder: Secondary | ICD-10-CM

## 2024-10-05 MED ORDER — TROSPIUM CHLORIDE 20 MG PO TABS
20.0000 mg | ORAL_TABLET | Freq: Two times a day (BID) | ORAL | 5 refills | Status: DC
  Filled 2024-10-05: qty 60, 30d supply, fill #0

## 2024-10-05 NOTE — Progress Notes (Unsigned)
 Name: Rita Cruz MRN: [id number] DOB: [birth year]    CHIEF COMPLAINT:  Assessment of fatigue Patient with history of arrhythmias possible heart block   HISTORY OF PRESENT ILLNESS:  83 y.o. female with PMH notable for HTN, HLD was seen  for electrophysiology consult of complete heart block    Iinitial cardiology consult of exertional dyspnea with occasional nocturnal palpitations, and recommended TTE, zio, and PET to further eval.   Two week zio revealed 7 nocturnal CHB, longest of which was 4.2 seconds, none associated with patient triggers. Also showed 21 SVT episodes, also not associated with patient trigger. PET was normal.     Also associated with  excessive daytime sleepiness for a long time Patient has been having extreme fatigue and tiredness, lack of energy +  very Loud snoring every night + Nonrefreshing sleep  Discussed sleep data and reviewed with patient.  Encouraged proper weight management.  Discussed driving precautions and its relationship with hypersomnolence.  Discussed operating dangerous equipment and its relationship with hypersomnolence.  Discussed sleep hygiene, and benefits of a fixed sleep waked time.  The importance of getting eight or more hours of sleep discussed with patient.  Discussed limiting the use of the computer and television before bedtime.  Decrease naps during the day, so night time sleep will become enhanced.  Limit caffeine, and sleep deprivation.  HTN, stroke, and heart failure are potential risk factors.       10/05/2024    1:00 PM  Results of the Epworth flowsheet  Sitting and reading 1  Watching TV 1  Sitting, inactive in a public place (e.g. a theatre or a meeting) 0  As a passenger in a car for an hour without a break 0  Lying down to rest in the afternoon when circumstances permit 2  Sitting and talking to someone 0  Sitting quietly after a lunch without alcohol 1  In a car, while stopped for a few minutes in  traffic 0  Total score 5   No exacerbation at this time No evidence of heart failure at this time No evidence or signs of infection at this time No respiratory distress No fevers, chills, nausea, vomiting, diarrhea No evidence of lower extremity edema No evidence hemoptysis     PAST MEDICAL HISTORY :   has a past medical history of Adenomatous polyps, Allergy, Anxiety, Arthritis, Bronchitis (02/04/2018), Capsulitis of toe of right foot, Cataract, Chest pain, Colon polyps, Depression, GERD (gastroesophageal reflux disease), History of colonic polyps, HTN (hypertension), Hyperlipidemia, Postoperative nausea and vomiting (01/06/2022), and PUD (peptic ulcer disease).  has a past surgical history that includes Cholecystectomy; hysterectomy-unspecified area; Cervical discectomy; Abdominal hysterectomy; Eye surgery; Colonoscopy with propofol  (N/A, 10/06/2016); Colonoscopy with propofol  (N/A, 12/30/2018); Laparoscopic right colectomy (Right, 01/23/2019); Colonoscopy with propofol  (N/A, 01/30/2020); Fracture surgery (Right, 05/21/2020); Breast biopsy (Right, 2011); Breast biopsy (Right, 06/29/2015); Colonoscopy with propofol  (N/A, 02/24/2023); Tubal ligation; Colon surgery (2020); and Spine surgery. Prior to Admission medications   Medication Sig Start Date End Date Taking? Authorizing Provider  acetaminophen  (TYLENOL ) 325 MG tablet Take 650 mg by mouth every 6 (six) hours as needed for moderate pain.    [provider]  alendronate  (FOSAMAX ) 70 MG tablet Take 1 tablet (70 mg total) by mouth every 7 (seven) days. Take with a full glass of water on an empty stomach. 07/12/24   Bacigalupo, Angela M, MD  ALPRAZolam  (XANAX ) 0.5 MG tablet TAKE 1 TABLET BY MOUTH EVERY 12 HOURS **MUST LAST 30 DAYS** 05/08/24  Bacigalupo, Angela M, MD  Ascorbic Acid (VITAMIN C) 1000 MG tablet Take 1,000 mg by mouth daily.    [provider]  aspirin  81 MG EC tablet Take 81 mg by mouth at bedtime.  09/25/10    [provider]  atorvastatin  (LIPITOR) 10 MG tablet TAKE ONE TABLET BY MOUTH ONCE DAILY 09/23/23   Bacigalupo, Angela M, MD  cholecalciferol (VITAMIN D3) 25 MCG (1000 UNIT) tablet Take 1,000 Units by mouth daily.    [provider]  co-enzyme Q-10 30 MG capsule Take 30 mg by mouth daily.    [provider]  fluticasone  (FLONASE ) 50 MCG/ACT nasal spray Place 2 sprays into both nostrils daily as needed for allergies. 01/19/24   Tapia, Leisa, PA-C  GENTEAL TEARS 0.1-0.3 % SOLN as needed.  11/22/19   [provider]  loratadine  (CLARITIN ) 10 MG tablet Take 1 tablet (10 mg total) by mouth at bedtime. 01/19/24   Tapia, Leisa, PA-C  losartan  (COZAAR ) 100 MG tablet Take 1 tablet (100 mg total) by mouth daily. 08/17/24   Bacigalupo, Angela M, MD  omeprazole  (PRILOSEC) 20 MG capsule TAKE 1 CAPSULE BY MOUTH ONCE DAILY 08/29/24   Bacigalupo, Angela M, MD  sertraline  (ZOLOFT ) 100 MG tablet Take 1 tablet (100 mg total) by mouth daily. 08/18/24   Bacigalupo, Angela M, MD  Trospium Chloride 60 MG CP24 Take 1 capsule (60 mg total) by mouth daily. 09/22/24   Marilynne Rosaline SAILOR, MD  zinc gluconate 50 MG tablet Take 50 mg by mouth daily.    [provider]   Allergies  Allergen Reactions   Morphine  And Codeine Other (See Comments)    Hallucinations    Percocet  [Oxycodone -Acetaminophen ] Other (See Comments)    Hallucinations Hallucinations   Levaquin [Levofloxacin In D5w] Diarrhea    Vaginal itching   Lisinopril      cough   Meloxicam      Upset stomach   Oxycodone      Other Reaction(s): Not available   Penicillins Itching    FAMILY HISTORY:  family history includes Bipolar disorder in her son; COPD in her mother; Colon cancer (age of onset: 41) in her mother; Colon polyps in her daughter; Diabetes in her father; Early death in her brother; Heart attack in her mother; Heart disease in her brother and brother; Heart failure in her father; Hypertension in her  brother and son; Irritable bowel syndrome in her daughter; Kidney Stones in her son; Kidney cancer in an other family member; Kidney failure in her father; Macular degeneration in her mother; Melanoma in her son; Osteoporosis in her mother; Ulcers in her mother. SOCIAL HISTORY:  reports that she has never smoked. She has never used smokeless tobacco. She reports current alcohol use of about 7.0 standard drinks of alcohol per week. She reports that she does not use drugs.   BP 110/60   Pulse 74   Temp 98.2 F (36.8 C)   Ht 5' 9 (1.753 m)   Wt 165 lb 6.4 oz (75 kg)   SpO2 93%   BMI 24.43 kg/m      Review of Systems: Gen:  Denies  fever, sweats, chills weight loss  HEENT: Denies blurred vision, double vision, ear pain, eye pain, hearing loss, nose bleeds, sore throat Cardiac:  No dizziness, chest pain or heaviness, chest tightness,edema, No JVD Resp:   No cough, -sputum production, -shortness of breath,-wheezing, -hemoptysis,  Other:  All other systems negative   Physical Examination:   General Appearance: No  distress  EYES PERRLA, EOM intact.   NECK Supple, No JVD Pulmonary: normal breath sounds, No wheezing.  CardiovascularNormal S1,S2.  No m/r/g.   Abdomen: Benign, Soft, non-tender. Neurology UE/LE 5/5 strength, no focal deficits Ext pulses intact, cap refill intact ALL OTHER ROS ARE NEGATIVE     ASSESSMENT AND PLAN SYNOPSIS 83 year old pleasant white female seen today for assessment of OSA patient with underlying SVT and episodic complete heart block.   Recommend home sleep Study for definitve diagnosis  Avoid Allergens and Irritants Avoid secondhand smoke Avoid SICK contacts Recommend  Masking  when appropriate Recommend Keep up-to-date with vaccinations  MEDICATION ADJUSTMENTS/LABS AND TESTS ORDERED: Recommend Sleep Study  CURRENT MEDICATIONS REVIEWED AT LENGTH WITH PATIENT TODAY   Patient  satisfied with Plan of action and management. All questions  answered   Follow up 3 months   I spent a total of 45 minutes dedicated to the care of this patient on the date of this encounter to include pre-visit review of records, face-to-face time with the patient discussing conditions above, post visit ordering of testing, clinical documentation with the electronic health record, making appropriate referrals as documented, and communicating necessary information to the patient's healthcare team.     Nickolas Alm Cellar, M.D.  [facility]  Medical Director [facility] [place]

## 2024-10-05 NOTE — Patient Instructions (Addendum)
Recommend home sleep study to assess for sleep apnea 

## 2024-10-16 ENCOUNTER — Telehealth: Payer: Self-pay

## 2024-10-16 ENCOUNTER — Other Ambulatory Visit: Payer: Self-pay

## 2024-10-16 NOTE — Telephone Encounter (Signed)
 Copied from CRM #[id number]. Topic: Clinical - Prescription Issue >> Oct 16, 2024  9:59 AM Nathanel BROCKS wrote: Reason for CRM: sertraline  (ZOLOFT ) 100 MG tablet  [facility] [place]  [phone]  Please call to verify dosage with [place] .

## 2024-10-16 NOTE — Telephone Encounter (Signed)
 Copied from CRM [id number]. Topic: General - Call Back - No Documentation >> Oct 16, 2024  4:08 PM [name] wrote: Reason for CRM: pt returned call to nurse. She prefers  [facility] - [place], [place] - [address] [place] [address] Phone: [phone] Fax: [phone]

## 2024-10-16 NOTE — Telephone Encounter (Signed)
 Attempted to call back and speak with Rita Cruz  Call was unsuccessful. Please verify what pharmacy is calling upon call back

## 2024-10-17 NOTE — Telephone Encounter (Signed)
 Copied from CRM [id number]. Topic: General - Call Back - No Documentation >> Oct 16, 2024  4:08 PM [name] wrote: Reason for CRM: pt returned call to nurse. She prefers  [facility] - [place], [place] - [address] Rita Cruz [place] [address] Phone: [phone] Fax: [phone]

## 2024-10-17 NOTE — Telephone Encounter (Signed)
Adding to original encounter.

## 2024-10-17 NOTE — Telephone Encounter (Signed)
 Spoke with pharmacy and they wanted to verify if patient is to take 1 tablet daily or 1.5 tablets daily as her dispense was showing for 135 tablets which does reflect for patient to take 1.5 daily. I advised that rx was sent incorrectly on 08/17/24 and updated on 08/18/24 to dispense 90 tablets so that patient is taking 1 tablet daily. Pharmacy verbalized understanding and confirmed seeing the updated order that was sent on 08/18/24 and asked that we advise the patient of this. I verbalized understanding.  Contacted patient and received voicemail. Detailed vm left per DPR. Ok for E2C2 to advise please take sertraline  (zoloft ) 100 mg tablet once daily and rx should be available at the pharmacy. If she is taking 1.5 tablets daily please let us  know so that we can make provider aware and receive consent for an updated order. Provider will not be back into office until Monday 10/23/24 so if she needs to have rx filled please pick up what is on file and take as prescribed until further advised.

## 2024-10-18 NOTE — Telephone Encounter (Signed)
 Copied from CRM #[id number]. Topic: General - Other >> Oct 17, 2024  5:01 PM Sophia H wrote: Reason for CRM: Patient returning missed phone call, advised per chart note please take sertraline  (zoloft ) 100 mg tablet once daily and rx should be available at the pharmacy. If she is taking 1.5 tablets daily please let us  know so that we can make provider aware and receive consent for an updated order. Provider will not be back into office until Monday 10/23/24 so if she needs to have rx filled please pick up what is on file and take as prescribed until further advised.   Patient state she is taking 1.5 tablets daily, please send updated RX to pharmacy, patient states she is out.   [facility] - [place], [place] - [address]  [address] [place], [place] [address]

## 2024-10-18 NOTE — Telephone Encounter (Signed)
 Updated rx is not sent. Patient can fill current rx that is at pharmacy and take 1.5 tablets of that. I can only send rx based off provider approval and a rx for 1.5 tablets is not in her chart for me to do so. Patient pcp out of office until Monday. I will send her request over to another provider here in office for now but no guarantee rx will be updated.   OK for e2c2 to advise if call is returned. Detailed VM left per patient DPR

## 2024-10-26 DIAGNOSIS — D485 Neoplasm of uncertain behavior of skin: Secondary | ICD-10-CM | POA: Diagnosis not present

## 2024-10-26 DIAGNOSIS — L578 Other skin changes due to chronic exposure to nonionizing radiation: Secondary | ICD-10-CM | POA: Diagnosis not present

## 2024-10-26 DIAGNOSIS — Z872 Personal history of diseases of the skin and subcutaneous tissue: Secondary | ICD-10-CM | POA: Diagnosis not present

## 2024-10-26 DIAGNOSIS — L570 Actinic keratosis: Secondary | ICD-10-CM | POA: Diagnosis not present

## 2024-10-26 DIAGNOSIS — Z86018 Personal history of other benign neoplasm: Secondary | ICD-10-CM | POA: Diagnosis not present

## 2024-10-26 DIAGNOSIS — D1801 Hemangioma of skin and subcutaneous tissue: Secondary | ICD-10-CM | POA: Diagnosis not present

## 2024-11-02 ENCOUNTER — Encounter

## 2024-11-03 ENCOUNTER — Ambulatory Visit: Admitting: Obstetrics and Gynecology

## 2024-11-06 ENCOUNTER — Other Ambulatory Visit: Payer: Self-pay | Admitting: Family Medicine

## 2024-11-06 DIAGNOSIS — F418 Other specified anxiety disorders: Secondary | ICD-10-CM

## 2024-11-10 DIAGNOSIS — R0683 Snoring: Secondary | ICD-10-CM | POA: Diagnosis not present

## 2024-11-13 ENCOUNTER — Ambulatory Visit: Payer: Self-pay

## 2024-11-13 DIAGNOSIS — G4733 Obstructive sleep apnea (adult) (pediatric): Secondary | ICD-10-CM

## 2024-11-29 ENCOUNTER — Ambulatory Visit: Admitting: Obstetrics and Gynecology

## 2024-11-30 ENCOUNTER — Other Ambulatory Visit: Payer: Self-pay | Admitting: Family Medicine

## 2024-12-07 ENCOUNTER — Ambulatory Visit (INDEPENDENT_AMBULATORY_CARE_PROVIDER_SITE_OTHER): Admitting: Family Medicine

## 2024-12-07 ENCOUNTER — Encounter: Payer: Self-pay | Admitting: Family Medicine

## 2024-12-07 VITALS — BP 95/66 | HR 90 | Resp 16 | Ht 68.0 in | Wt 160.0 lb

## 2024-12-07 DIAGNOSIS — F418 Other specified anxiety disorders: Secondary | ICD-10-CM | POA: Diagnosis not present

## 2024-12-07 DIAGNOSIS — R7989 Other specified abnormal findings of blood chemistry: Secondary | ICD-10-CM

## 2024-12-07 DIAGNOSIS — J0140 Acute pansinusitis, unspecified: Secondary | ICD-10-CM | POA: Diagnosis not present

## 2024-12-07 DIAGNOSIS — G4733 Obstructive sleep apnea (adult) (pediatric): Secondary | ICD-10-CM | POA: Diagnosis not present

## 2024-12-07 DIAGNOSIS — N3281 Overactive bladder: Secondary | ICD-10-CM | POA: Diagnosis not present

## 2024-12-07 DIAGNOSIS — F331 Major depressive disorder, recurrent, moderate: Secondary | ICD-10-CM

## 2024-12-07 DIAGNOSIS — I10 Essential (primary) hypertension: Secondary | ICD-10-CM | POA: Diagnosis not present

## 2024-12-07 DIAGNOSIS — I459 Conduction disorder, unspecified: Secondary | ICD-10-CM | POA: Diagnosis not present

## 2024-12-07 DIAGNOSIS — E7800 Pure hypercholesterolemia, unspecified: Secondary | ICD-10-CM

## 2024-12-07 MED ORDER — ALPRAZOLAM 0.5 MG PO TABS: ORAL_TABLET | ORAL | 1 refills | Status: AC

## 2024-12-07 MED ORDER — DOXYCYCLINE HYCLATE 100 MG PO TABS: 100.0000 mg | ORAL_TABLET | Freq: Two times a day (BID) | ORAL | 0 refills | Status: AC

## 2024-12-07 NOTE — Progress Notes (Signed)
 "     Established patient visit   Patient: Rita Cruz   DOB: [birth year]   84 y.o. Female  MRN: [id number] Visit Date: 12/07/2024  Today's healthcare provider: Jon Eva, MD   Chief Complaint  Patient presents with   Follow-up    6 month f/u. Wants to discuss testing   Subjective    HPI HPI     Follow-up    Additional comments: 6 month f/u. Wants to discuss testing      Last edited by Marylen Odella CROME, CMA on 12/07/2024  3:36 PM.       Discussed the use of AI scribe software for clinical note transcription with the patient, who gave verbal consent to proceed.  History of Present Illness   Rita Cruz is an 84 year old female with anxiety and sleep apnea who presents with concerns about her heart health and sinus issues.  She has significant anxiety worsened by family stressors, including her son's addiction and her granddaughter's mother's recent suicide attempt. She takes sertraline  100 mg daily after self-reducing from 150 mg and feels she is managing on this dose. She uses alprazolam  as needed.  She has sleep apnea confirmed by home sleep study with 22 apnea episodes and 148 oxygen desaturations over six hours. She snores and family have noticed her sleep issues.  She frequently has sinus infections. The most recent episode involved sinus pressure and thick clear mucus. Mucinex and nasal saline improved symptoms. She feels her hearing may be affected and attributes this to sinus problems. She denies green phlegm.  She is concerned about her heart health. She was once told she had complete heart block, later clarified as not present. She has shortness of breath and a strong family history of cardiac disease, including a brother with a pacemaker and another with severe coronary artery blockage. Prior PET CT and echocardiogram were normal.  She has kidney concerns that influence her willingness to take medications. She stopped a bladder medication  because she worried it was affecting her kidneys.  She notes low blood pressure readings that she associates with dehydration and increased physical activity. She monitors BP at home and tries to stay hydrated.  She lives with her son who has addiction issues. She maintains relationships with her daughter and grandchildren.          12/07/2024    3:44 PM 06/08/2024   10:44 AM 04/06/2024    2:38 PM 01/18/2024    4:12 PM 01/11/2023   11:41 AM  Depression screen PHQ 2/9  Decreased Interest 0 0 0 2 0  Down, Depressed, Hopeless 1 1 1 2 1   PHQ - 2 Score 1 1 1 4 1   Altered sleeping 0  1 2   Tired, decreased energy 1  1 2    Change in appetite 0  1 0   Feeling bad or failure about yourself  0  0 0   Trouble concentrating 0  1 0   Moving slowly or fidgety/restless 0  0 0   Suicidal thoughts 0  0 0   PHQ-9 Score 2  5  8     Difficult doing work/chores Somewhat difficult  Not difficult at all Not difficult at all      Data saved with a previous flowsheet row definition      12/07/2024    3:45 PM 04/06/2024    2:39 PM 03/09/2022    3:34 PM 07/25/2020   11:51 AM  GAD 7 :  Generalized Anxiety Score  Nervous, Anxious, on Edge 0 1 1   Control/stop worrying 0 1 1   Worry too much - different things 1 1 3    Trouble relaxing 1 1 0   Restless 0 1 0   Easily annoyed or irritable 0 1 0   Afraid - awful might happen 1 1 0   Total GAD 7 Score 3 7 5    Anxiety Difficulty Not difficult at all Not difficult at all Somewhat difficult Not difficult at all     Medications: Show/hide medication list[1]  Review of Systems     Objective    BP 95/66 (BP Location: Right Arm, Patient Position: Sitting, Cuff Size: Normal)   Pulse 90   Resp 16   Ht 5' 8 (1.727 m)   Wt 160 lb (72.6 kg)   SpO2 98%   BMI 24.33 kg/m    Physical Exam Vitals reviewed.  Constitutional:      General: She is not in acute distress.    Appearance: Normal appearance. She is well-developed. She is not diaphoretic.  HENT:      Head: Normocephalic and atraumatic.     Right Ear: Tympanic membrane, ear canal and external ear normal.     Left Ear: Tympanic membrane, ear canal and external ear normal.     Nose: Congestion present.     Mouth/Throat:     Mouth: Mucous membranes are moist.     Pharynx: Oropharynx is clear. No oropharyngeal exudate.  Eyes:     General: No scleral icterus.    Conjunctiva/sclera: Conjunctivae normal.  Neck:     Thyroid : No thyromegaly.  Cardiovascular:     Rate and Rhythm: Normal rate and regular rhythm.     Heart sounds: Normal heart sounds. No murmur heard. Pulmonary:     Effort: Pulmonary effort is normal. No respiratory distress.     Breath sounds: Normal breath sounds. No wheezing, rhonchi or rales.  Musculoskeletal:     Cervical back: Neck supple.     Right lower leg: No edema.     Left lower leg: No edema.  Lymphadenopathy:     Cervical: No cervical adenopathy.  Skin:    General: Skin is warm and dry.     Findings: No rash.  Neurological:     Mental Status: She is alert and oriented to person, place, and time. Mental status is at baseline.  Psychiatric:        Mood and Affect: Mood normal.        Behavior: Behavior normal.      No results found for any visits on 12/07/24.  Assessment & Plan     Problem List Items Addressed This Visit       Cardiovascular and Mediastinum   Essential (primary) hypertension - Primary     Genitourinary   Overactive bladder     Other   MDD (major depressive disorder)   Relevant Medications   ALPRAZolam  (XANAX ) 0.5 MG tablet   Situational anxiety   Relevant Medications   ALPRAZolam  (XANAX ) 0.5 MG tablet   Hypercholesteremia   Other Visit Diagnoses       Acute non-recurrent pansinusitis       Relevant Medications   doxycycline  (VIBRA -TABS) 100 MG tablet     Elevated serum creatinine       Relevant Orders   Basic Metabolic Panel (BMET)     OSA (obstructive sleep apnea)         Heart block  Relevant Orders    Ambulatory referral to Cardiac Electrophysiology           Acute pansinusitis Recent sinus infection with persistent pressure and hearing difficulties. Negative at-home test for sinus infection. Symptoms include productive cough with clear, thick mucus. Hearing aids may be affected by sinus issues. - Prescribed doxycycline  for sinus infection - Advised use of nasal saline for symptom relief  Obstructive sleep apnea Diagnosed with significant sleep apnea based on home sleep study showing 22 apneic episodes and 148 oxygen desaturation events over 6 hours. Symptoms include snoring and daytime fatigue. Untreated sleep apnea may contribute to cardiac issues. - Continue follow-up with sleep specialist Dr. Francis - Plan for CPAP therapy as recommended by sleep specialist  Cardiac conduction disorder Heart monitor showed episodes of heart block with pauses up to 4 seconds. Echocardiogram and PET CT normal. Sleep apnea may contribute to cardiac issues. Family history of cardiac issues noted. Discussed potential need for pacemaker and importance of electrophysiology evaluation. - Referred to electrophysiologist Dr. Kennyth for evaluation of heart block  Elevated serum creatinine Concerns about kidney health due to medication side effects. Patient stopped tryptophan chloride due to concerns about kidney effects and side effects experienced. Reassured that doxycycline  will not harm kidneys. - Ordered lab tests to recheck kidney function  General health maintenance Received flu shot on September 30th. RSV vaccination status confirmed as up to date. - Continue routine health maintenance and vaccinations as needed       Return in about 6 months (around 06/06/2025) for CPE.      I personally spent a total of 45 minutes in the care of the patient today including preparing to see the patient, getting/reviewing separately obtained history, performing a medically appropriate exam/evaluation, counseling and  educating, placing orders, referring and communicating with other health care professionals, documenting clinical information in the EHR, independently interpreting results, and coordinating care.   Jon Eva, MD  [facility] [phone] (phone) [phone] (fax)  [facility]      [1]  Outpatient Medications Prior to Visit  Medication Sig   acetaminophen  (TYLENOL ) 325 MG tablet Take 650 mg by mouth every 6 (six) hours as needed for moderate pain.   Ascorbic Acid (VITAMIN C) 1000 MG tablet Take 1,000 mg by mouth daily.   aspirin  81 MG EC tablet Take 81 mg by mouth at bedtime.    atorvastatin  (LIPITOR) 10 MG tablet TAKE ONE TABLET BY MOUTH ONCE DAILY   cholecalciferol (VITAMIN D3) 25 MCG (1000 UNIT) tablet Take 1,000 Units by mouth daily.   co-enzyme Q-10 30 MG capsule Take 30 mg by mouth daily.   fluticasone  (FLONASE ) 50 MCG/ACT nasal spray Place 2 sprays into both nostrils daily as needed for allergies.   GENTEAL TEARS 0.1-0.3 % SOLN as needed.    losartan  (COZAAR ) 100 MG tablet Take 1 tablet (100 mg total) by mouth daily.   omeprazole  (PRILOSEC) 20 MG capsule TAKE 1 CAPSULE BY MOUTH ONCE DAILY   sertraline  (ZOLOFT ) 100 MG tablet Take 1 tablet (100 mg total) by mouth daily.   zinc gluconate 50 MG tablet Take 50 mg by mouth daily.   [DISCONTINUED] ALPRAZolam  (XANAX ) 0.5 MG tablet TAKE 1 TABLET BY MOUTH EVERY 12 HOURS **MUST LAST 30 DAYS**   [DISCONTINUED] trospium  (SANCTURA ) 20 MG tablet Take 1 tablet (20 mg total) by mouth 2 (two) times daily.   [DISCONTINUED] alendronate  (FOSAMAX ) 70 MG tablet Take 1 tablet (70 mg total) by mouth  every 7 (seven) days. Take with a full glass of water on an empty stomach. (Patient not taking: Reported on 12/07/2024)   [DISCONTINUED] loratadine  (CLARITIN ) 10 MG tablet Take 1 tablet (10 mg total) by mouth at bedtime. (Patient not taking: Reported on 12/07/2024)   No facility-administered medications prior  to visit.   "

## 2024-12-08 DIAGNOSIS — G4733 Obstructive sleep apnea (adult) (pediatric): Secondary | ICD-10-CM | POA: Insufficient documentation

## 2024-12-08 NOTE — Assessment & Plan Note (Signed)
 Symptoms managed with biofeedback and pessary. Bladder has dropped slightly but not significantly. - Continue current management with biofeedback and pessary

## 2024-12-08 NOTE — Assessment & Plan Note (Signed)
 As above

## 2024-12-08 NOTE — Assessment & Plan Note (Signed)
 Blood pressure low today, possibly due to dehydration. Lightheadedness reported earlier today. Losartan  dose to be adjusted if low blood pressure persists. - Advised hydration and home blood pressure monitoring - Instructed to report blood pressure readings if low persists

## 2024-12-08 NOTE — Assessment & Plan Note (Signed)
 Anxiety persists despite current sertraline  regimen. She self-adjusted dose from 150 mg to 100 mg due to concerns about kidney health. Side effects reported from bladder medicine, including tiredness, stomach discomfort, and feeling full after eating. Alprazolam  refill needed. - Refilled alprazolam  prescription - Continue sertraline  at current dose of 100 mg daily

## 2024-12-08 NOTE — Assessment & Plan Note (Signed)
Previously well controlled Continue statin 

## 2024-12-11 ENCOUNTER — Telehealth: Payer: Self-pay | Admitting: Cardiology

## 2024-12-11 NOTE — Telephone Encounter (Signed)
 Patient has a referral from [facility], and I reached out to schedule with patient. She states she was told last time she was here she had a heart block, and she states there were cardiac tests ordered, which she has not heard any of the results. Patient states she is scared because of her heart block. Please call to discuss.

## 2024-12-14 ENCOUNTER — Other Ambulatory Visit: Payer: Self-pay | Admitting: Family Medicine

## 2024-12-19 NOTE — Telephone Encounter (Signed)
 Clarified with patient that she saw EP in October Patient states she has discovered she has serious sleep apnea so now she is waiting on her machine Patient does not feel it is necessary to schedule with EP at this time but will call if feels needed

## 2024-12-21 ENCOUNTER — Ambulatory Visit: Payer: Self-pay | Admitting: Family Medicine

## 2024-12-21 LAB — BASIC METABOLIC PANEL WITH GFR
BUN/Creatinine Ratio: 7 — ABNORMAL LOW (ref 12–28)
BUN: 6 mg/dL — ABNORMAL LOW (ref 8–27)
CO2: 26 mmol/L (ref 20–29)
Calcium: 8.9 mg/dL (ref 8.7–10.3)
Chloride: 101 mmol/L (ref 96–106)
Creatinine, Ser: 0.81 mg/dL (ref 0.57–1.00)
Glucose: 97 mg/dL (ref 70–99)
Potassium: 4.1 mmol/L (ref 3.5–5.2)
Sodium: 138 mmol/L (ref 134–144)
eGFR: 72 mL/min/{1.73_m2}

## 2024-12-28 ENCOUNTER — Ambulatory Visit: Admitting: Internal Medicine

## 2025-01-23 ENCOUNTER — Ambulatory Visit: Payer: PPO

## 2025-01-23 ENCOUNTER — Ambulatory Visit: Admitting: Internal Medicine
